# Patient Record
Sex: Female | Born: 1950 | Race: White | Hispanic: Yes | Marital: Married | Smoking: Former smoker
Health system: Southern US, Community
[De-identification: ages and names within clinical notes are randomized; demographics above are authoritative.]

## PROBLEM LIST (undated history)

## (undated) DIAGNOSIS — M8580 Other specified disorders of bone density and structure, unspecified site: Secondary | ICD-10-CM

## (undated) DIAGNOSIS — G20A1 Parkinson's disease without dyskinesia, without mention of fluctuations: Secondary | ICD-10-CM

## (undated) DIAGNOSIS — Z862 Personal history of diseases of the blood and blood-forming organs and certain disorders involving the immune mechanism: Secondary | ICD-10-CM

## (undated) HISTORY — DX: Gilbert syndrome: E80.4

## (undated) HISTORY — DX: Other specified disorders of bone density and structure, unspecified site: M85.80

## (undated) HISTORY — PX: TONSILLECTOMY: SUR1361

## (undated) HISTORY — PX: BREAST EXCISIONAL BIOPSY: SUR124

## (undated) HISTORY — DX: Parkinson's disease without dyskinesia, without mention of fluctuations: G20.A1

## (undated) HISTORY — DX: Personal history of diseases of the blood and blood-forming organs and certain disorders involving the immune mechanism: Z86.2

---

## 1991-07-10 HISTORY — PX: ABDOMINAL HYSTERECTOMY: SHX81

## 2008-08-26 LAB — HM COLONOSCOPY

## 2017-12-13 LAB — HM MAMMOGRAPHY

## 2018-02-11 LAB — COMPREHENSIVE METABOLIC PANEL WITH GFR: EGFR: 70.1

## 2019-05-29 DIAGNOSIS — R251 Tremor, unspecified: Secondary | ICD-10-CM | POA: Insufficient documentation

## 2019-05-29 DIAGNOSIS — R5382 Chronic fatigue, unspecified: Secondary | ICD-10-CM | POA: Insufficient documentation

## 2019-06-09 LAB — COMPREHENSIVE METABOLIC PANEL WITH GFR: EGFR: 80.6

## 2019-06-09 LAB — HEMOGLOBIN A1C: A1c: 5.1

## 2019-12-02 DIAGNOSIS — G20 Parkinson's disease: Secondary | ICD-10-CM | POA: Insufficient documentation

## 2019-12-15 LAB — HM COLONOSCOPY

## 2019-12-18 LAB — HM MAMMOGRAPHY

## 2020-06-22 LAB — HM DEXA SCAN

## 2020-08-12 ENCOUNTER — Ambulatory Visit: Payer: Medicare PPO | Admitting: Family

## 2020-09-26 NOTE — Progress Notes (Signed)
Subjective:    Patient ID: Dominique Griffin, female    DOB: November 01, 1950, 70 y.o.   MRN: [id number]  HPI  She is here to establish with a new pcp.  The patient is here for follow up of their chronic medical problems.    She moved here in August from [place].    She has osteopenia. She has parkinson's.     She exericses regualry - cycling, weights once a week, walks dog, boxing.    She has tailbone pain - ? Lower back arthritis.  Better w/ nsaids.  She takes naproxen.    New - started about 10 days ago.  Started with exercise class.  Tenderness improving.     Parkinson's  -- following with neuro at [facility].  Fatigue .  Had mild tremor in r arm with fatigue, has some slowness in her R arm.      Sudafed - occ sinus headache - advil sinus.    Medications and allergies reviewed with patient and updated if appropriate.  Patient Active Problem List   Diagnosis Date Noted  . Arthritis 09/27/2020  . [place] disease 09/27/2020  . Parkinson's disease (HCC) 12/02/2019  . Tremor 05/29/2019  . Chronic fatigue 05/29/2019    Current Outpatient Medications on File Prior to Visit  Medication Sig Dispense Refill  . calcium carbonate (TUMS - DOSED IN MG ELEMENTAL CALCIUM) 500 MG chewable tablet Chew 1 tablet by mouth daily.    . Cholecalciferol 50 MCG (2000 UT) TABS Take by mouth.    Marland Kitchen ibuprofen (ADVIL) 200 MG tablet Take by mouth.    . Multiple Vitamin (MULTIVITAMIN ADULT PO) Take by mouth.    . pseudoephedrine (SUDAFED 12 HOUR) 120 MG 12 hr tablet Take 120 mg by mouth 2 (two) times daily.     No current facility-administered medications on file prior to visit.    History reviewed. No pertinent past medical history.  Past Surgical History:  Procedure Laterality Date  . ABDOMINAL HYSTERECTOMY     partial - still has ovaries    Social History   Socioeconomic History  . Marital status: Married    Spouse name: Not on file  . Number of children: Not on file  . Years of education:  Not on file  . Highest education level: Not on file  Occupational History  . Not on file  Tobacco Use  . Smoking status: Not on file  . Smokeless tobacco: Not on file  Substance and Sexual Activity  . Alcohol use: Not on file  . Drug use: Not on file  . Sexual activity: Not on file  Other Topics Concern  . Not on file  Social History Narrative  . Not on file   Social Determinants of Health   Financial Resource Strain: Not on file  Food Insecurity: Not on file  Transportation Needs: Not on file  Physical Activity: Not on file  Stress: Not on file  Social Connections: Not on file    Family History  Problem Relation Age of Onset  . Breast cancer Mother   . Stroke Mother     Review of Systems  Constitutional: Negative for chills and fever.  HENT: Positive for postnasal drip.   Eyes: Negative for visual disturbance.  Respiratory: Negative for cough, shortness of breath and wheezing.   Cardiovascular: Negative for chest pain, palpitations and leg swelling.  Gastrointestinal: Negative for abdominal pain, constipation and diarrhea.       Rare gerd  Musculoskeletal: Positive for  back pain (lower back, tailbone). Negative for arthralgias (occ joint pain).  Skin: Negative for rash.  Neurological: Positive for tremors. Negative for light-headedness and headaches.       Poor balance  Psychiatric/Behavioral: Negative for dysphoric mood. The patient is not nervous/anxious.        Objective:   Vitals:   09/27/20 0957  BP: 124/70  Pulse: 99  Temp: 98.1 F (36.7 C)  SpO2: 100%   BP Readings from Last 3 Encounters:  09/27/20 124/70   Wt Readings from Last 3 Encounters:  09/27/20 210 lb (95.3 kg)   Body mass index is 33.89 kg/m.   Depression screen [facility] 2/9 09/27/2020  Decreased Interest 0  Down, Depressed, Hopeless 0  PHQ - 2 Score 0  Altered sleeping 0  Tired, decreased energy 1  Change in appetite 0  Feeling bad or failure about yourself  0  Trouble  concentrating 0  Moving slowly or fidgety/restless 0  Suicidal thoughts 0  PHQ-9 Score 1  Difficult doing work/chores Somewhat difficult    GAD 7 : Generalized Anxiety Score 09/27/2020  Nervous, Anxious, on Edge 0  Control/stop worrying 0  Worry too much - different things 0  Trouble relaxing 0  Restless 0  Easily annoyed or irritable 0  Afraid - awful might happen 0  Total GAD 7 Score 0      Physical Exam Constitutional:      General: She is not in acute distress.    Appearance: Normal appearance. She is not ill-appearing.  HENT:     Head: Normocephalic and atraumatic.  Eyes:     Conjunctiva/sclera: Conjunctivae normal.  Neck:     Vascular: No carotid bruit.  Cardiovascular:     Rate and Rhythm: Normal rate and regular rhythm.     Heart sounds: No murmur heard.   Pulmonary:     Effort: Pulmonary effort is normal. No respiratory distress.     Breath sounds: No wheezing or rales.  Abdominal:     General: There is no distension.     Palpations: Abdomen is soft.     Tenderness: There is no abdominal tenderness. There is no guarding.  Musculoskeletal:     Cervical back: Neck supple. No tenderness.     Right lower leg: No edema.     Left lower leg: No edema.  Lymphadenopathy:     Cervical: No cervical adenopathy.  Skin:    General: Skin is warm and dry.  Neurological:     Mental Status: She is alert.  Psychiatric:        Mood and Affect: Mood normal.              Assessment & Plan:    Screened for depression using the PHQ 9 scale.  No evidence of depression.    Screened for anxiety using GAD7 Scale.  No evidence of anxiety.    See Problem List for Assessment and Plan of chronic medical problems.    This visit occurred during the SARS-CoV-2 public health emergency.  Safety protocols were in place, including screening questions prior to the visit, additional usage of staff PPE, and extensive cleaning of exam room while observing appropriate contact  time as indicated for disinfecting solutions.

## 2020-09-27 ENCOUNTER — Encounter: Payer: Self-pay | Admitting: Internal Medicine

## 2020-09-27 ENCOUNTER — Ambulatory Visit (INDEPENDENT_AMBULATORY_CARE_PROVIDER_SITE_OTHER): Payer: Medicare PPO | Admitting: Internal Medicine

## 2020-09-27 ENCOUNTER — Other Ambulatory Visit: Payer: Self-pay

## 2020-09-27 DIAGNOSIS — G20 Parkinson's disease: Secondary | ICD-10-CM | POA: Diagnosis not present

## 2020-09-27 DIAGNOSIS — M1990 Unspecified osteoarthritis, unspecified site: Secondary | ICD-10-CM | POA: Insufficient documentation

## 2020-09-27 DIAGNOSIS — R5382 Chronic fatigue, unspecified: Secondary | ICD-10-CM | POA: Diagnosis not present

## 2020-09-27 DIAGNOSIS — M533 Sacrococcygeal disorders, not elsewhere classified: Secondary | ICD-10-CM

## 2020-09-27 NOTE — Patient Instructions (Signed)
It was nice to meet you.   Please let me know if you need anything    Please followup in 1 year

## 2020-09-27 NOTE — Assessment & Plan Note (Signed)
Chronic Related to Parkinson's disease She is exercising regularly

## 2020-09-27 NOTE — Assessment & Plan Note (Addendum)
Chronic Not on medication Following with Dr Nedra Hai at [facility] Not on medication - only symptom that bothers her is the fatigue and since medication will not help she does not want to be on medication Exercising regularly - walks dog, boxing, weights once a week, cycling

## 2020-09-27 NOTE — Assessment & Plan Note (Signed)
Acute Started 10 days ago after an exercise class Improving with nsaids - she will continue

## 2020-10-06 ENCOUNTER — Encounter: Payer: Self-pay | Admitting: Internal Medicine

## 2020-10-06 NOTE — Progress Notes (Signed)
Outside notes received. Information abstracted. Notes sent to scan.  

## 2020-10-17 ENCOUNTER — Ambulatory Visit: Payer: Medicare PPO | Admitting: Family

## 2020-11-13 ENCOUNTER — Encounter: Payer: Self-pay | Admitting: Internal Medicine

## 2020-11-13 DIAGNOSIS — M8580 Other specified disorders of bone density and structure, unspecified site: Secondary | ICD-10-CM | POA: Insufficient documentation

## 2020-11-13 DIAGNOSIS — Z800 Family history of malignant neoplasm of digestive organs: Secondary | ICD-10-CM | POA: Insufficient documentation

## 2020-11-13 DIAGNOSIS — M810 Age-related osteoporosis without current pathological fracture: Secondary | ICD-10-CM | POA: Insufficient documentation

## 2020-11-13 DIAGNOSIS — E559 Vitamin D deficiency, unspecified: Secondary | ICD-10-CM | POA: Insufficient documentation

## 2020-11-13 DIAGNOSIS — K5730 Diverticulosis of large intestine without perforation or abscess without bleeding: Secondary | ICD-10-CM | POA: Insufficient documentation

## 2021-01-30 ENCOUNTER — Encounter: Payer: Self-pay | Admitting: Internal Medicine

## 2021-01-30 DIAGNOSIS — Z1231 Encounter for screening mammogram for malignant neoplasm of breast: Secondary | ICD-10-CM

## 2021-02-03 DIAGNOSIS — H524 Presbyopia: Secondary | ICD-10-CM | POA: Diagnosis not present

## 2021-02-03 DIAGNOSIS — H2513 Age-related nuclear cataract, bilateral: Secondary | ICD-10-CM | POA: Diagnosis not present

## 2021-02-03 DIAGNOSIS — D3132 Benign neoplasm of left choroid: Secondary | ICD-10-CM | POA: Diagnosis not present

## 2021-03-20 DIAGNOSIS — G20 Parkinson's disease: Secondary | ICD-10-CM | POA: Diagnosis not present

## 2021-04-14 ENCOUNTER — Ambulatory Visit: Payer: Medicare PPO

## 2021-04-14 ENCOUNTER — Encounter: Payer: Self-pay | Admitting: Internal Medicine

## 2021-05-02 ENCOUNTER — Encounter: Payer: Self-pay | Admitting: Internal Medicine

## 2021-05-02 NOTE — Progress Notes (Signed)
Outside notes received. Information abstracted. Notes sent to scan.  

## 2021-05-08 ENCOUNTER — Other Ambulatory Visit: Payer: Self-pay

## 2021-05-08 ENCOUNTER — Ambulatory Visit
Admission: RE | Admit: 2021-05-08 | Discharge: 2021-05-08 | Disposition: A | Discharge status: Home / Self Care | Payer: Medicare PPO | Source: Ambulatory Visit | Attending: Internal Medicine | Admitting: Internal Medicine

## 2021-05-08 DIAGNOSIS — Z1231 Encounter for screening mammogram for malignant neoplasm of breast: Secondary | ICD-10-CM | POA: Diagnosis not present

## 2021-05-10 ENCOUNTER — Other Ambulatory Visit: Payer: Self-pay | Admitting: Internal Medicine

## 2021-05-10 DIAGNOSIS — R928 Other abnormal and inconclusive findings on diagnostic imaging of breast: Secondary | ICD-10-CM

## 2021-06-05 ENCOUNTER — Ambulatory Visit
Admission: RE | Admit: 2021-06-05 | Discharge: 2021-06-05 | Disposition: A | Discharge status: Home / Self Care | Payer: Medicare PPO | Source: Ambulatory Visit | Attending: Internal Medicine | Admitting: Internal Medicine

## 2021-06-05 ENCOUNTER — Other Ambulatory Visit: Payer: Self-pay

## 2021-06-05 ENCOUNTER — Other Ambulatory Visit: Payer: Self-pay | Admitting: Internal Medicine

## 2021-06-05 DIAGNOSIS — N6489 Other specified disorders of breast: Secondary | ICD-10-CM

## 2021-06-05 DIAGNOSIS — R928 Other abnormal and inconclusive findings on diagnostic imaging of breast: Secondary | ICD-10-CM

## 2021-06-05 DIAGNOSIS — R922 Inconclusive mammogram: Secondary | ICD-10-CM | POA: Diagnosis not present

## 2021-06-21 ENCOUNTER — Encounter: Payer: Self-pay | Admitting: Internal Medicine

## 2021-06-22 DIAGNOSIS — L821 Other seborrheic keratosis: Secondary | ICD-10-CM | POA: Diagnosis not present

## 2021-06-22 DIAGNOSIS — D225 Melanocytic nevi of trunk: Secondary | ICD-10-CM | POA: Diagnosis not present

## 2021-06-22 DIAGNOSIS — L720 Epidermal cyst: Secondary | ICD-10-CM | POA: Diagnosis not present

## 2021-06-22 DIAGNOSIS — L570 Actinic keratosis: Secondary | ICD-10-CM | POA: Diagnosis not present

## 2021-06-22 DIAGNOSIS — L820 Inflamed seborrheic keratosis: Secondary | ICD-10-CM | POA: Diagnosis not present

## 2021-06-22 DIAGNOSIS — D1801 Hemangioma of skin and subcutaneous tissue: Secondary | ICD-10-CM | POA: Diagnosis not present

## 2021-06-22 DIAGNOSIS — L603 Nail dystrophy: Secondary | ICD-10-CM | POA: Diagnosis not present

## 2021-06-22 DIAGNOSIS — D2262 Melanocytic nevi of left upper limb, including shoulder: Secondary | ICD-10-CM | POA: Diagnosis not present

## 2021-06-27 DIAGNOSIS — Z006 Encounter for examination for normal comparison and control in clinical research program: Secondary | ICD-10-CM | POA: Insufficient documentation

## 2021-10-01 ENCOUNTER — Encounter: Payer: Self-pay | Admitting: Internal Medicine

## 2021-10-01 NOTE — Patient Instructions (Addendum)
? ? ?Blood work was ordered.   ? ? ?Medications changes include :   None ? ? ? ?Return in about 1 year (around 10/03/2022) for CPE. ? ? ? ?Health Maintenance, Female ?Adopting a healthy lifestyle and getting preventive care are important in promoting health and wellness. Ask your health care provider about: ?The right schedule for you to have regular tests and exams. ?Things you can do on your own to prevent diseases and keep yourself healthy. ?What should I know about diet, weight, and exercise? ?Eat a healthy diet ? ?Eat a diet that includes plenty of vegetables, fruits, low-fat dairy products, and lean protein. ?Do not eat a lot of foods that are high in solid fats, added sugars, or sodium. ?Maintain a healthy weight ?Body mass index (BMI) is used to identify weight problems. It estimates body fat based on height and weight. Your health care provider can help determine your BMI and help you achieve or maintain a healthy weight. ?Get regular exercise ?Get regular exercise. This is one of the most important things you can do for your health. Most adults should: ?Exercise for at least 150 minutes each week. The exercise should increase your heart rate and make you sweat (moderate-intensity exercise). ?Do strengthening exercises at least twice a week. This is in addition to the moderate-intensity exercise. ?Spend less time sitting. Even light physical activity can be beneficial. ?Watch cholesterol and blood lipids ?Have your blood tested for lipids and cholesterol at 70 years of age, then have this test every 5 years. ?Have your cholesterol levels checked more often if: ?Your lipid or cholesterol levels are high. ?You are older than 70 years of age. ?You are at high risk for heart disease. ?What should I know about cancer screening? ?Depending on your health history and family history, you may need to have cancer screening at various ages. This may include screening for: ?Breast cancer. ?Cervical cancer. ?Colorectal  cancer. ?Skin cancer. ?Lung cancer. ?What should I know about heart disease, diabetes, and high blood pressure? ?Blood pressure and heart disease ?High blood pressure causes heart disease and increases the risk of stroke. This is more likely to develop in people who have high blood pressure readings or are overweight. ?Have your blood pressure checked: ?Every 3-5 years if you are 65-20 years of age. ?Every year if you are 72 years old or older. ?Diabetes ?Have regular diabetes screenings. This checks your fasting blood sugar level. Have the screening done: ?Once every three years after age 56 if you are at a normal weight and have a low risk for diabetes. ?More often and at a younger age if you are overweight or have a high risk for diabetes. ?What should I know about preventing infection? ?Hepatitis B ?If you have a higher risk for hepatitis B, you should be screened for this virus. Talk with your health care provider to find out if you are at risk for hepatitis B infection. ?Hepatitis C ?Testing is recommended for: ?Everyone born from [birth year] through 1964. ?Anyone with known risk factors for hepatitis C. ?Sexually transmitted infections (STIs) ?Get screened for STIs, including gonorrhea and chlamydia, if: ?You are sexually active and are younger than 70 years of age. ?You are older than 70 years of age and your health care provider tells you that you are at risk for this type of infection. ?Your sexual activity has changed since you were last screened, and you are at increased risk for chlamydia or gonorrhea. Ask your health care provider  if you are at risk. ?Ask your health care provider about whether you are at high risk for HIV. Your health care provider may recommend a prescription medicine to help prevent HIV infection. If you choose to take medicine to prevent HIV, you should first get tested for HIV. You should then be tested every 3 months for as long as you are taking the medicine. ?Pregnancy ?If you are  about to stop having your period (premenopausal) and you may become pregnant, seek counseling before you get pregnant. ?Take 400 to 800 micrograms (mcg) of folic acid every day if you become pregnant. ?Ask for birth control (contraception) if you want to prevent pregnancy. ?Osteoporosis and menopause ?Osteoporosis is a disease in which the bones lose minerals and strength with aging. This can result in bone fractures. If you are 70 years old or older, or if you are at risk for osteoporosis and fractures, ask your health care provider if you should: ?Be screened for bone loss. ?Take a calcium or vitamin D supplement to lower your risk of fractures. ?Be given hormone replacement therapy (HRT) to treat symptoms of menopause. ?Follow these instructions at home: ?Alcohol use ?Do not drink alcohol if: ?Your health care provider tells you not to drink. ?You are pregnant, may be pregnant, or are planning to become pregnant. ?If you drink alcohol: ?Limit how much you have to: ?0-1 drink a day. ?Know how much alcohol is in your drink. In the [place] one drink equals one 12 oz bottle of beer (355 mL), one 5 oz glass of wine (148 mL), or one 1? oz glass of hard liquor (44 mL). ?Lifestyle ?Do not use any products that contain nicotine or tobacco. These products include cigarettes, chewing tobacco, and vaping devices, such as e-cigarettes. If you need help quitting, ask your health care provider. ?Do not use street drugs. ?Do not share needles. ?Ask your health care provider for help if you need support or information about quitting drugs. ?General instructions ?Schedule regular health, dental, and eye exams. ?Stay current with your vaccines. ?Tell your health care provider if: ?You often feel depressed. ?You have ever been abused or do not feel safe at home. ?Summary ?Adopting a healthy lifestyle and getting preventive care are important in promoting health and wellness. ?Follow your health care provider's instructions about  healthy diet, exercising, and getting tested or screened for diseases. ?Follow your health care provider's instructions on monitoring your cholesterol and blood pressure. ?This information is not intended to replace advice given to you by your health care provider. Make sure you discuss any questions you have with your health care provider. ?Document Revised: 11/14/2020 Document Reviewed: 11/14/2020 ?Elsevier Patient Education ? [place]. ? ?

## 2021-10-01 NOTE — Progress Notes (Signed)
? ? ?Subjective:  ? ? Patient ID: Dominique Griffin, female    DOB: 1950-11-13, 71 y.o.   MRN: [id number] ? ? ?This visit occurred during the SARS-CoV-2 public health emergency.  Safety protocols were in place, including screening questions prior to the visit, additional usage of staff PPE, and extensive cleaning of exam room while observing appropriate contact time as indicated for disinfecting solutions. ? ? ? ?HPI ?Dominique Griffin is here for  ?Chief Complaint  ?Patient presents with  ? Annual Exam  ? ? ?Overall doing well.  She is on an experimental drug for her Parkinson's disease-she is on the study through [place].  She continues to exercise regularly. ? ?She has been experiencing some back pain.  She is thinking of doing physical therapy-she will let me know if she needs a referral.  Pain is across the lower back, but without radiation. ? ?Medications and allergies reviewed with patient and updated if appropriate. ? ? ? ?Current Outpatient Medications on File Prior to Visit  ?Medication Sig Dispense Refill  ? calcium carbonate (TUMS - DOSED IN MG ELEMENTAL CALCIUM) 500 MG chewable tablet Chew 1 tablet by mouth daily.    ? Cholecalciferol 50 MCG (2000 UT) TABS Take by mouth.    ? ibuprofen (ADVIL) 200 MG tablet Take by mouth.    ? Multiple Vitamin (MULTIVITAMIN ADULT PO) Take by mouth.    ? pseudoephedrine (SUDAFED) 120 MG 12 hr tablet Take 120 mg by mouth 2 (two) times daily.    ? ?No current facility-administered medications on file prior to visit.  ? ? ?Review of Systems  ?Constitutional:  Negative for fever.  ?Eyes:  Negative for visual disturbance.  ?Respiratory:  Negative for cough, shortness of breath and wheezing.   ?Cardiovascular:  Negative for chest pain, palpitations and leg swelling.  ?Gastrointestinal:  Negative for abdominal pain, blood in stool, constipation, diarrhea and nausea.  ?Genitourinary:  Negative for dysuria.  ?Musculoskeletal:  Positive for arthralgias (knee) and back pain.  ?Skin:  Negative for  rash.  ?Neurological:  Positive for light-headedness (from medication). Negative for headaches.  ?Psychiatric/Behavioral:  Negative for dysphoric mood. The patient is not nervous/anxious.   ? ?   ?Objective:  ? ?Vitals:  ? 10/02/21 1033  ?BP: 130/70  ?Pulse: 79  ?Temp: 98.5 ?F (36.9 ?C)  ?SpO2: 98%  ? ?Filed Weights  ? 10/02/21 1033  ?Weight: 197 lb (89.4 kg)  ? ?Body mass index is 31.8 kg/m?. ? ?BP Readings from Last 3 Encounters:  ?10/02/21 130/70  ?09/27/20 124/70  ? ? ?Wt Readings from Last 3 Encounters:  ?10/02/21 197 lb (89.4 kg)  ?09/27/20 210 lb (95.3 kg)  ? ? ? ?  10/02/2021  ? 10:45 AM 09/27/2020  ?  7:21 PM  ?Depression screen PHQ 2/9  ?Decreased Interest 0 0  ?Down, Depressed, Hopeless 0 0  ?PHQ - 2 Score 0 0  ?Altered sleeping  0  ?Tired, decreased energy  1  ?Change in appetite  0  ?Feeling bad or failure about yourself   0  ?Trouble concentrating  0  ?Moving slowly or fidgety/restless  0  ?Suicidal thoughts  0  ?PHQ-9 Score  1  ?Difficult doing work/chores  Somewhat difficult  ? ? ? ? ?  09/27/2020  ?  7:21 PM  ?GAD 7 : Generalized Anxiety Score  ?Nervous, Anxious, on Edge 0  ?Control/stop worrying 0  ?Worry too much - different things 0  ?Trouble relaxing 0  ?Restless 0  ?Easily annoyed  or irritable 0  ?Afraid - awful might happen 0  ?Total GAD 7 Score 0  ? ? ? ? ?  ?Physical Exam ?Constitutional: She appears well-developed and well-nourished. No distress.  ?HENT:  ?Head: Normocephalic and atraumatic.  ?Right Ear: External ear normal. Normal ear canal and TM ?Left Ear: External ear normal.  Normal ear canal and TM ?Mouth/Throat: Oropharynx is clear and moist.  ?Eyes: Conjunctivae and EOM are normal.  ?Neck: Neck supple. No tracheal deviation present. No thyromegaly present.  ?No carotid bruit  ?Cardiovascular: Normal rate, regular rhythm and normal heart sounds.   ?No murmur heard.  No edema. ?Pulmonary/Chest: Effort normal and breath sounds normal. No respiratory distress. She has no wheezes. She has  no rales.  ?Breast: deferred   ?Abdominal: Soft. She exhibits no distension. There is no tenderness.  ?Lymphadenopathy: She has no cervical adenopathy.  ?Skin: Skin is warm and dry. She is not diaphoretic.  ?Psychiatric: She has a normal mood and affect. Her behavior is normal.  ? ? ? ?No results found for: WBC, HGB, HCT, PLT, GLUCOSE, CHOL, TRIG, HDL, LDLDIRECT, LDLCALC, ALT, AST, NA, K, CL, CREATININE, BUN, CO2, TSH, PSA, INR, GLUF, HGBA1C, MICROALBUR ? ? ? ?   ?Assessment & Plan:  ? ?Physical exam: ?Screening blood work  ordered ?Exercise  weights 1/ week, does pilates, power moves, kick boxing, walks dog daily ?Weight  - has lost some weight  ?Substance abuse  none ? ? ?Reviewed recommended immunizations. ? ? ?Health Maintenance  ?Topic Date Due  ? Hepatitis C Screening  Never done  ? COVID-19 Vaccine (4 - Booster for Pfizer series) 10/18/2021 (Originally 06/06/2020)  ? Pneumonia Vaccine 51+ Years old (1 - PCV) 10/03/2022 (Originally 11/25/2016)  ? TETANUS/TDAP  10/03/2022 (Originally 01/23/2018)  ? DEXA SCAN  11/11/2022  ? MAMMOGRAM  05/09/2023  ? COLONOSCOPY (Pts 45-64yrs Insurance coverage will need to be confirmed)  12/14/2029  ? INFLUENZA VACCINE  Completed  ? Zoster Vaccines- Shingrix  Completed  ? HPV VACCINES  Aged Out  ?  ? ? ? ? ? ? ?See Problem List for Assessment and Plan of chronic medical problems. ? ? ? ? ?

## 2021-10-02 ENCOUNTER — Other Ambulatory Visit: Payer: Self-pay

## 2021-10-02 ENCOUNTER — Ambulatory Visit (INDEPENDENT_AMBULATORY_CARE_PROVIDER_SITE_OTHER): Payer: Medicare PPO | Admitting: Internal Medicine

## 2021-10-02 VITALS — BP 130/70 | HR 79 | Temp 98.5°F | Ht 66.0 in | Wt 197.0 lb

## 2021-10-02 DIAGNOSIS — Z0000 Encounter for general adult medical examination without abnormal findings: Secondary | ICD-10-CM

## 2021-10-02 DIAGNOSIS — M85852 Other specified disorders of bone density and structure, left thigh: Secondary | ICD-10-CM | POA: Diagnosis not present

## 2021-10-02 DIAGNOSIS — Z136 Encounter for screening for cardiovascular disorders: Secondary | ICD-10-CM | POA: Diagnosis not present

## 2021-10-02 DIAGNOSIS — G20 Parkinson's disease: Secondary | ICD-10-CM | POA: Diagnosis not present

## 2021-10-02 DIAGNOSIS — E559 Vitamin D deficiency, unspecified: Secondary | ICD-10-CM

## 2021-10-02 DIAGNOSIS — Z1159 Encounter for screening for other viral diseases: Secondary | ICD-10-CM

## 2021-10-02 LAB — COMPREHENSIVE METABOLIC PANEL
ALT: 9 U/L (ref 0–35)
AST: 12 U/L (ref 0–37)
Albumin: 4.6 g/dL (ref 3.5–5.2)
Alkaline Phosphatase: 57 U/L (ref 39–117)
BUN: 13 mg/dL (ref 6–23)
CO2: 27 mEq/L (ref 19–32)
Calcium: 9.6 mg/dL (ref 8.4–10.5)
Chloride: 104 mEq/L (ref 96–112)
Creatinine, Ser: 0.73 mg/dL (ref 0.40–1.20)
GFR: 83.66 mL/min (ref >60.00)
Glucose, Bld: 93 mg/dL (ref 70–99)
Potassium: 4.3 mEq/L (ref 3.5–5.1)
Sodium: 138 mEq/L (ref 135–145)
Total Bilirubin: 1.6 mg/dL — ABNORMAL HIGH (ref 0.2–1.2)
Total Protein: 6.7 g/dL (ref 6.0–8.3)

## 2021-10-02 LAB — CBC WITH DIFFERENTIAL/PLATELET
Basophils Absolute: 0.1 10*3/uL (ref 0.0–0.1)
Basophils Relative: 0.9 % (ref 0.0–3.0)
Eosinophils Absolute: 0.1 10*3/uL (ref 0.0–0.7)
Eosinophils Relative: 1.8 % (ref 0.0–5.0)
HCT: 41 % (ref 36.0–46.0)
Hemoglobin: 14.1 g/dL (ref 12.0–15.0)
Lymphocytes Relative: 18.2 % (ref 12.0–46.0)
Lymphs Abs: 1.3 10*3/uL (ref 0.7–4.0)
MCHC: 34.3 g/dL (ref 30.0–36.0)
MCV: 89.9 fl (ref 78.0–100.0)
Monocytes Absolute: 0.8 10*3/uL (ref 0.1–1.0)
Monocytes Relative: 10.8 % (ref 3.0–12.0)
Neutro Abs: 4.9 10*3/uL (ref 1.4–7.7)
Neutrophils Relative %: 68.3 % (ref 43.0–77.0)
Platelets: 251 10*3/uL (ref 150.0–400.0)
RBC: 4.56 Mil/uL (ref 3.87–5.11)
RDW: 12.7 % (ref 11.5–15.5)
WBC: 7.2 10*3/uL (ref 4.0–10.5)

## 2021-10-02 LAB — LIPID PANEL
Cholesterol: 180 mg/dL (ref 0–200)
HDL: 62.5 mg/dL (ref >39.00)
LDL Cholesterol: 105 mg/dL — ABNORMAL HIGH (ref 0–99)
NonHDL: 117.24
Total CHOL/HDL Ratio: 3
Triglycerides: 62 mg/dL (ref 0.0–149.0)
VLDL: 12.4 mg/dL (ref 0.0–40.0)

## 2021-10-02 NOTE — Assessment & Plan Note (Signed)
Chronic Taking vitamin D daily Check vitamin D level  

## 2021-10-02 NOTE — Assessment & Plan Note (Addendum)
Chronic ?Following with neurology at [facility]-Dr. Nedra Hai ?Exercising regularly ?On research study - new dopamine agonist through [facility] ?

## 2021-10-02 NOTE — Assessment & Plan Note (Signed)
Chronic ?Exercising regularly ?Continue calcium and vitamin d daily, MVI ?

## 2021-10-03 LAB — HEPATITIS C ANTIBODY
Hepatitis C Ab: NONREACTIVE
SIGNAL TO CUT-OFF: <0.02 (ref <1.00)

## 2021-10-03 LAB — TSH: TSH: 2.38 u[IU]/mL (ref 0.35–5.50)

## 2021-10-03 LAB — VITAMIN D 25 HYDROXY (VIT D DEFICIENCY, FRACTURES): VITD: 43.42 ng/mL (ref 30.00–100.00)

## 2021-11-15 ENCOUNTER — Encounter: Payer: Self-pay | Admitting: Podiatry

## 2021-11-15 ENCOUNTER — Ambulatory Visit: Payer: Medicare PPO | Admitting: Podiatry

## 2021-11-15 DIAGNOSIS — L600 Ingrowing nail: Secondary | ICD-10-CM

## 2021-11-15 DIAGNOSIS — B351 Tinea unguium: Secondary | ICD-10-CM

## 2021-11-15 NOTE — Progress Notes (Signed)
Subjective:  ? ?Patient ID: Dominique Griffin, female   DOB: 71 y.o.   MRN: [id number]  ? ?HPI ?Patient presents with several concerns with 1 being discoloration of nailbeds bilateral and secondarily incurvated nail bed right over left hallux medial border that is been painful and she cannot take care of.  She does have Parkinson's and it is hard for her to provide her own type of care does not smoke likes to be active ? ? ?Review of Systems  ?All other systems reviewed and are negative. ? ? ?   ?Objective:  ?Physical Exam ?Vitals and nursing note reviewed.  ?Constitutional:   ?   Appearance: She is well-developed.  ?Pulmonary:  ?   Effort: Pulmonary effort is normal.  ?Musculoskeletal:     ?   General: Normal range of motion.  ?Skin: ?   General: Skin is warm.  ?Neurological:  ?   Mental Status: She is alert.  ?  ?Neurovascular status intact muscle strength found to be adequate range of motion adequate.  Patient is found to have discoloration of the hallux nail bilateral on the distal lateral side one third of the nailbed localized has incurvated hallux nail right over left with pain in the medial border difficulty wearing shoe gear with no redness no drainage noted.  Good digital perfusion well oriented x3 ingrown ? ?   ?Assessment:  ?Toenail deformity hallux right over left with medial border involvement with moderate mycotic infection nailbeds versus trauma ? ?   ?Plan:  ?H&P all conditions reviewed and do not recommend current treatment for fungus as I think this is localized.  I do recommend treatment for the ingrown toenail and she wants to get this done and I went ahead and I explained procedure risk and were just can to do the right 1 today and I infiltrated the right toe 60 mg like Marcaine mixture sterile prep done and using sterile instrumentation I remove the medial border exposed matrix applied phenol 3 applications 30 seconds followed by alcohol lavage sterile dressing gave instructions on soaks and to  leave dressing on 24 hours but take it off earlier if throbbing were to occur and encouraged her to call questions concerns which may arise ?   ? ? ?

## 2021-11-15 NOTE — Patient Instructions (Signed)

## 2021-12-11 ENCOUNTER — Ambulatory Visit
Admission: RE | Admit: 2021-12-11 | Discharge: 2021-12-11 | Disposition: A | Discharge status: Home / Self Care | Payer: Medicare PPO | Source: Ambulatory Visit | Attending: Internal Medicine | Admitting: Internal Medicine

## 2021-12-11 DIAGNOSIS — N6489 Other specified disorders of breast: Secondary | ICD-10-CM

## 2021-12-11 DIAGNOSIS — N6011 Diffuse cystic mastopathy of right breast: Secondary | ICD-10-CM | POA: Diagnosis not present

## 2021-12-11 DIAGNOSIS — R928 Other abnormal and inconclusive findings on diagnostic imaging of breast: Secondary | ICD-10-CM | POA: Diagnosis not present

## 2021-12-12 ENCOUNTER — Other Ambulatory Visit: Payer: Self-pay | Admitting: Internal Medicine

## 2021-12-12 DIAGNOSIS — Z1231 Encounter for screening mammogram for malignant neoplasm of breast: Secondary | ICD-10-CM

## 2021-12-29 ENCOUNTER — Encounter: Payer: Self-pay | Admitting: Internal Medicine

## 2022-01-05 ENCOUNTER — Encounter: Payer: Self-pay | Admitting: Internal Medicine

## 2022-01-08 ENCOUNTER — Other Ambulatory Visit: Payer: Self-pay

## 2022-01-08 ENCOUNTER — Encounter: Payer: Self-pay | Admitting: Physical Therapy

## 2022-01-08 ENCOUNTER — Ambulatory Visit: Payer: Medicare PPO | Attending: Neurology | Admitting: Physical Therapy

## 2022-01-08 DIAGNOSIS — R42 Dizziness and giddiness: Secondary | ICD-10-CM | POA: Diagnosis not present

## 2022-01-08 DIAGNOSIS — R2681 Unsteadiness on feet: Secondary | ICD-10-CM | POA: Diagnosis not present

## 2022-01-08 NOTE — Therapy (Signed)
OUTPATIENT PHYSICAL THERAPY VESTIBULAR EVALUATION     Patient Name: Dominique Griffin MRN: [id number] DOB:[birth year], 71 y.o., female Today's Date: 01/08/2022  PCP: Pincus Sanes, MD REFERRING PROVIDER: Corie Chiquito, MD   PT End of Session - 01/08/22 [phone]     Visit Number 1    Number of Visits 13    Date for PT Re-Evaluation 02/19/22    Authorization Type [employer]    Progress Note Due on Visit 10    PT Start Time 0805    PT Stop Time 0854    PT Time Calculation (min) 49 min             History reviewed. No pertinent past medical history. 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  Has been very debilitating-didn't even go to boxing class last week. Pt accompanied by: significant other  PERTINENT HISTORY: Parkinson's disease dx 2020, hx of low back pain; arthritis, chronic fatigue, Gilbert disease, osteopenia   PAIN:  Are you having pain? No  PRECAUTIONS: None  WEIGHT BEARING RESTRICTIONS No  FALLS: Has patient fallen in last 6 months? No  LIVING ENVIRONMENT: Lives with: lives with their family and lives with their spouse  PLOF: Independent,  exercises in community Parkinson's exercise classes several times per week.  PATIENT GOALS To get rid of dizziness sensation  OBJECTIVE:   COGNITION: Overall cognitive status: Within functional limits for tasks assessed    Cervical ROM:   Active A/PROM (deg) eval  Flexion   Extension   Right lateral flexion   Left lateral flexion   Right rotation WFL  Left rotation WFL  (Blank rows = not tested)  STRENGTH: NT at eval  BED MOBILITY:  independent  TRANSFERS: Assistive device utilized: None  Sit to stand: Complete Independence Stand to sit: Complete Independence GAIT: Gait pattern:  slowed, guarded, step through pattern, decreased arm swing- Right, and decreased arm swing- Left Distance walked: 60 ft Assistive device utilized: None Level of assistance: Modified independence Comments: Guarded gait pattern  PATIENT SURVEYS:  FOTO Score of 35 at intake/eval; predicted score 51   VESTIBULAR ASSESSMENT   GENERAL OBSERVATION: Moving very slowly    SYMPTOM BEHAVIOR:   Subjective history: Has had hx of lightheadedness in the past.  In the past several weeks, any movement makes me feel lightheaded.  This feels more like when I move my head, my eyes trail, and it makes me nauseous.  Wake up in the morning and it's not bad.  It's more when I'm still.    Non-Vestibular symptoms: tinnitus and nausea/vomiting (tinnitus is synchronus with heartbeat-this is not new)   Type of dizziness: Spinning/Vertigo, Unsteady with head/body turns, Lightheadedness/Faint,  and "World moves"   Frequency: multiple episodes/ongoing through the day   Duration: cumulative throughout the day; lasts through the day   Aggravating factors: Induced by position change: supine to sit and sit to supine, Induced by motion: turning body quickly, turning head quickly, and sitting in a moving car, Moving eyes, and tends to be worse    Relieving factors: head stationary, lying supine, medication, slow movements, and  sitting still   Progression of symptoms: worse   OCULOMOTOR EXAM:  Rates symptoms initially as 1/10   Ocular Alignment: normal   Ocular ROM: No limitations;  and slight feeling of movement upon return/stop to midline.  Saccades more to R side than L.   Spontaneous Nystagmus: absent   Gaze-Induced Nystagmus: absent   Smooth Pursuits: saccades more to R side than L   Saccades: intact         VESTIBULAR - OCULAR REFLEX:    Slow VOR: Normal   VOR Cancellation: Normal   Head-Impulse Test: HIT Right: negative HIT Left: positive   Dynamic Visual Acuity: Static: Line 7 Dynamic: Line 6 with 2-3/10 c/o dizziness    POSITIONAL TESTING: Right Dix-Hallpike: upbeating, right nystagmus Left Dix-Hallpike: no nystagmus Right Roll Test: no nystagmus Left Roll Test: no nystagmus   RDH:  mild symptoms (eyelids twitch) and no c/o until return up to sit Right roll test:  negative, except brief symptoms upon return to sit.  VESTIBULAR TREATMENT:  Canalith Repositioning:   Epley Right: Number of Reps: 1   No nystagmus, no symptoms upon return to sit  PATIENT EDUCATION: Education details: PT eval results, POC, rationale for Epley maneuver Person educated: Patient and Spouse Education method: Explanation Education comprehension: verbalized understanding   GOALS: Goals reviewed with patient? Yes  SHORT TERM GOALS: Target date: 01/22/2022  Pt will report decrease in dizziness by at least 50% with functional mobility and throughout daily activities. Baseline: Goal status: INITIAL  2.  Pt will be independent with initial HEP to reduce dizziness symptoms. Baseline:  Goal status: INITIAL   LONG TERM GOALS: Target date: 02/19/2022  Pt will be independent with progression of HEP for improved dizziness symptoms.  Baseline:  Goal status: INITIAL  2.  FOTO score to improve to at least 51 for demonstration of improved functional outcome measure. Baseline: 35 at eval Goal status: INITIAL  3.   Pt will report no c/o dizziness with functional mobility, turns, gait activities throughout the day. Baseline:  Goal status: INITIAL  4.  FGA score to be assessed and goal to be written as appropriate. Baseline:  Goal status: INITIAL  5.  MCTSIB testing to be assessed and goal to be written as appropriate. Baseline:  Goal status: INITIAL   ASSESSMENT:  CLINICAL IMPRESSION: Patient is a 71 y.o. female who was seen today for physical therapy evaluation and treatment for dizziness and giddiness.   She has had history of lightheadedness for some time, but in the past 2-3 weeks, pt has noted increased dizziness, feeling of "eyes trailing" with head movement, and nausea with cumulative movement throughout the day.  This is limiting her ability to perform daily exercise tasks in the community in addition to household ADLs and tasks.  In eval today, she does demonstrate saccadic eye movement with horizontal head turns; she has one line difference with static/dynamic visual acuity testing, and positive head thrust test to L.  Pt does have mild, brief nystagmus and mild symptoms in R Dix-Hallpike testing.  Performed Epley maneuver and  no c/o symptoms upon completion.  Pt would benefit from skilled PT to lessen symptoms of dizziness and improve/return to prior level of functional mobility.   OBJECTIVE IMPAIRMENTS Abnormal gait, decreased activity tolerance, decreased balance, decreased mobility, difficulty walking, and dizziness.   ACTIVITY LIMITATIONS bending, standing, bed mobility, hygiene/grooming, and locomotion level  PARTICIPATION LIMITATIONS: driving, shopping, community activity, and community exercise  PERSONAL FACTORS 3+ comorbidities: see above in PMH  are also affecting patient's functional outcome.   REHAB POTENTIAL: Good  CLINICAL DECISION MAKING: Evolving/moderate complexity  EVALUATION COMPLEXITY: Moderate   PLAN: PT FREQUENCY: 2x/week  PT DURATION: 6 weeks, including eval  week  PLANNED INTERVENTIONS: Therapeutic exercises, Therapeutic activity, Neuromuscular re-education, Balance training, Gait training, Patient/Family education, Vestibular training, and Canalith repositioning  PLAN FOR NEXT SESSION: Reassess Dix-Hallpike and perform Epley as needed; initiate HEP for gaze stabilization, habituation exercises; test FGA and MCTSIB as able.   Gean Maidens., PT 01/08/2022, 5:20 PM   [facility] [address] [place], [address] [place], [place] [address] Phone # [phone] Fax # [phone]

## 2022-01-10 ENCOUNTER — Ambulatory Visit: Payer: Medicare PPO | Admitting: Physical Therapy

## 2022-01-10 DIAGNOSIS — R2681 Unsteadiness on feet: Secondary | ICD-10-CM

## 2022-01-10 DIAGNOSIS — R42 Dizziness and giddiness: Secondary | ICD-10-CM

## 2022-01-10 NOTE — Therapy (Addendum)
OUTPATIENT PHYSICAL THERAPY VESTIBULAR TREATMENT NOTE   Patient Name: Dominique Griffin MRN: [id number] DOB:Jan 09, 1951, 71 y.o., female Today's Date: 01/10/2022  PCP: Binnie Rail, MD REFERRING PROVIDER: Amanda Pea, MD    01/10/22 1155  PT Visits / Re-Eval  Visit Number 2  Number of Visits 13  Date for PT Re-Evaluation 02/19/22  Authorization  Authorization Type [employer]  Progress Note Due on Visit 10  PT Time Calculation  PT Start Time 1155  PT Stop Time 1245  PT Time Calculation (min) 50 min  PT - End of Session  Activity Tolerance Patient tolerated treatment well  Behavior During Therapy [facility] for tasks assessed/performed    No past medical history on file. Past Surgical History:  Procedure Laterality Date   ABDOMINAL HYSTERECTOMY     partial - still has ovaries   BREAST EXCISIONAL BIOPSY Left    skin bbiopsy   Patient Active Problem List   Diagnosis Date Noted   Clinical trial exam 06/27/2021   Osteopenia 11/13/2020   Vitamin D deficiency 11/13/2020   Diverticulosis of colon 11/13/2020   Family history of colon cancer 11/13/2020   Arthritis 09/27/2020   Rosanna Randy disease 09/27/2020   Coccyx pain 09/27/2020   Parkinson's disease ([place]) 12/02/2019   Tremor 05/29/2019   Chronic fatigue 05/29/2019    ONSET DATE: 01/04/2022 MD referral  REFERRING DIAG: Dizziness and Giddiness  THERAPY DIAG:   Dizziness and giddiness  Unsteadiness on feet Rationale for Evaluation and Treatment Rehabilitation  PERTINENT HISTORY:  Parkinson's disease dx 2020, hx of low back pain; arthritis, chronic fatigue, Gilbert disease, osteopenia  PRECAUTIONS: none  SUBJECTIVE: Pt reports slightly better with the vertigo, and not any nausea.  "Feels like my gyroscope is off, like I'm off the ground when I'm walking"  PAIN:  Are you having pain? No    OBJECTIVE:   TODAY'S TREATMENT:  01/10/2022  POSITIONAL TESTS:  Right Dix-Hallpike: upbeating, right nystagmus  Left  Dix-Hallpike: no nystagmus and c/o lightheadedness    VESTIBULAR TREATMENT:  Canalith Repositioning: Epley Right: Number of Reps: 1, Response to Treatment: symptoms improved, and Comment: 2/10 lightheadedness, wooziness    Gaze Adaptation: x1 Viewing Horizontal: Position: sitting, then standing, Time: 30 sec, and Comment: no c/o symptoms in sitting; feels more unsteady with balance in standing  and x1 Viewing Vertical:  Position: sitting, then standing, Time: 30 sec , and Comment: rates symptoms as 3-4/10 with standing  Habituation: Brandt-Daroff: number of reps: 2 To R and to L sidelying, pt has no c/o lightheadedness.  She c/o 10-12 sec lightheaded symptoms upon return to sit from R sidelying.  She has 10-15 sec of lightheaded symptoms return to sit from L sidelying. Other: Discussed rationale for Epley, habituation.  Discussed acute BPPV versus more oculomotor/vestibular-ocular reflex mechanisms as part of dizziness.  Discussed follow-up appts with ENT and with neurologist.  PATIENT EDUCATION: Education details: HEP Person educated: Patient and Spouse Education method: Explanation, Demonstration, and Handouts Education comprehension: verbalized understanding and returned demonstration Access Code: [email or web address] URL: [email or web address] Date: 01/12/2022 Prepared by: [facility]  Program Notes Do exercises without glasses on; you will stand 2-3 ft from target; with the head movements, keep the target in focus.  You will likely bring on your symptoms to a 4-5/10 level or below.  Rest between sets to bring symptoms down to 0/10.  Exercises - Standing Gaze Stabilization with Head Rotation  - 3 x daily - 7 x weekly -  1-2 sets - 30 sec hold - Standing Gaze Stabilization with Head Nod  - 3 x daily - 7 x weekly - 1-2 sets - 30 sec hold ______________________________________________ OBJECTIVE Measures from eval, 01/08/2022: COGNITION: Overall  cognitive status: Within functional limits for tasks assessed               Cervical ROM:    Active A/PROM (deg) eval  Flexion    Extension    Right lateral flexion    Left lateral flexion    Right rotation WFL  Left rotation WFL  (Blank rows = not tested)   STRENGTH: NT at eval   BED MOBILITY:  independent   TRANSFERS: Assistive device utilized: None  Sit to stand: Complete Independence Stand to sit: Complete Independence GAIT: Gait pattern:  slowed, guarded, step through pattern, decreased arm swing- Right, and decreased arm swing- Left Distance walked: 60 ft Assistive device utilized: None Level of assistance: Modified independence Comments: Guarded gait pattern   PATIENT SURVEYS:  FOTO Score of 35 at intake/eval; predicted score 51     VESTIBULAR ASSESSMENT             GENERAL OBSERVATION: Moving very slowly                        SYMPTOM BEHAVIOR:                       Subjective history: Has had hx of lightheadedness in the past.  In the past several weeks, any movement makes me feel lightheaded.  This feels more like when I move my head, my eyes trail, and it makes me nauseous.  Wake up in the morning and it's not bad.  It's more when I'm still.                        Non-Vestibular symptoms: tinnitus and nausea/vomiting (tinnitus is synchronus with heartbeat-this is not new)                       Type of dizziness: Spinning/Vertigo, Unsteady with head/body turns, Lightheadedness/Faint, and "World moves"                       Frequency: multiple episodes/ongoing through the day                       Duration: cumulative throughout the day; lasts through the day                       Aggravating factors: Induced by position change: supine to sit and sit to supine, Induced by motion: turning body quickly, turning head quickly, and sitting in a moving car, Moving eyes, and tends to be worse                        Relieving factors: head stationary, lying supine,  medication, slow movements, and sitting still                       Progression of symptoms: worse              OCULOMOTOR EXAM:  Rates symptoms initially as 1/10                       Ocular  Alignment: normal                       Ocular ROM: No limitations;  and slight feeling of movement upon return/stop to midline.  Saccades more to R side than L.                       Spontaneous Nystagmus: absent                       Gaze-Induced Nystagmus: absent                       Smooth Pursuits: saccades more to R side than L                       Saccades: intact                                                  VESTIBULAR - OCULAR REFLEX:                        Slow VOR: Normal                       VOR Cancellation: Normal                       Head-Impulse Test: HIT Right: negative HIT Left: positive                       Dynamic Visual Acuity: Static: Line 7 Dynamic: Line 6 with 2-3/10 c/o dizziness                        POSITIONAL TESTING: Right Dix-Hallpike: upbeating, right nystagmus Left Dix-Hallpike: no nystagmus Right Roll Test: no nystagmus Left Roll Test: no nystagmus             RDH:  mild symptoms (eyelids twitch) and no c/o until return up to sit Right roll test:  negative, except brief symptoms upon return to sit.   VESTIBULAR TREATMENT:   Canalith Repositioning:                       Epley Right: Number of Reps: 1                       No nystagmus, no symptoms upon return to sit   PATIENT EDUCATION: Education details: PT eval results, POC, rationale for Epley maneuver Person educated: Patient and Spouse Education method: Explanation Education comprehension: verbalized understanding    ____________________________________________________________________________ GOALS: Goals reviewed with patient? Yes   SHORT TERM GOALS: Target date: 01/22/2022   Pt will report decrease in dizziness by at least 50% with functional mobility and throughout daily  activities. Baseline: Goal status: INITIAL   2.  Pt will be independent with initial HEP to reduce dizziness symptoms. Baseline:  Goal status: INITIAL     LONG TERM GOALS: Target date: 02/19/2022   Pt will be independent with progression of HEP for improved dizziness symptoms.  Baseline:  Goal status: INITIAL   2.  FOTO score to improve to at least 51 for demonstration  of improved functional outcome measure. Baseline: 35 at eval Goal status: INITIAL   3.  Pt will report no c/o dizziness with functional mobility, turns, gait activities throughout the day. Baseline:  Goal status: INITIAL   4.  FGA score to be assessed and goal to be written as appropriate. Baseline:  Goal status: INITIAL   5.  MCTSIB testing to be assessed and goal to be written as appropriate. Baseline:  Goal status: INITIAL     ASSESSMENT:   CLINICAL IMPRESSION: Reassessed BPPV today, with pt demonstrating nystagmus with R Dix-Hallpike testing.  Treated with CRT/Epley maneuver.   Pt continues to report more that her symptoms are a lightheadness and feeling that "gyroscope" is off.  Symptoms present more with standing gaze stabilization exercises and added those for home versus Brandt-Daroff at this time.  Will continue to assess and treat positional vertigo and oculomotor aspects contributing to pt's dizziness.     OBJECTIVE IMPAIRMENTS Abnormal gait, decreased activity tolerance, decreased balance, decreased mobility, difficulty walking, and dizziness.    ACTIVITY LIMITATIONS bending, standing, bed mobility, hygiene/grooming, and locomotion level   PARTICIPATION LIMITATIONS: driving, shopping, community activity, and community exercise   PERSONAL FACTORS 3+ comorbidities: see above in PMH  are also affecting patient's functional outcome.    REHAB POTENTIAL: Good   CLINICAL DECISION MAKING: Evolving/moderate complexity   EVALUATION COMPLEXITY: Moderate     PLAN: PT FREQUENCY: 2x/week   PT  DURATION: 6 weeks, including eval week   PLANNED INTERVENTIONS: Therapeutic exercises, Therapeutic activity, Neuromuscular re-education, Balance training, Gait training, Patient/Family education, Vestibular training, and Canalith repositioning   PLAN FOR NEXT SESSION: Reassess Dix-Hallpike and perform Epley as needed; review HEP for gaze stabilization, habituation exercises; test FGA and MCTSIB as able.        Mady Haagensen, PT 01/12/22 7:29 AM Phone: [phone] Fax: [phone]  [facility] at [facility] [place], [place], [place] [address] Phone # [phone] Fax # [phone]

## 2022-01-11 ENCOUNTER — Ambulatory Visit: Payer: Medicare PPO | Admitting: Physical Therapy

## 2022-01-16 ENCOUNTER — Ambulatory Visit: Payer: Medicare PPO | Admitting: Rehabilitative and Restorative Service Providers"

## 2022-01-16 ENCOUNTER — Encounter: Payer: Self-pay | Admitting: Rehabilitative and Restorative Service Providers"

## 2022-01-16 DIAGNOSIS — R2681 Unsteadiness on feet: Secondary | ICD-10-CM | POA: Diagnosis not present

## 2022-01-16 DIAGNOSIS — R42 Dizziness and giddiness: Secondary | ICD-10-CM

## 2022-01-16 NOTE — Therapy (Addendum)
OUTPATIENT PHYSICAL THERAPY VESTIBULAR TREATMENT NOTE     Patient Name: Dominique Griffin MRN: [id number] DOB:1951-01-28, 71 y.o., female Today's Date: 01/10/2022   PCP: Pincus Sanes, MD REFERRING PROVIDER: Corie Chiquito, MD      PT End of Session - 01/16/22 1307     Visit Number 3    Number of Visits 13    Date for PT Re-Evaluation 02/19/22    Authorization Type [employer]    Progress Note Due on Visit 10    PT Start Time 1311    PT Stop Time 1408    PT Time Calculation (min) 57 min    Activity Tolerance Patient tolerated treatment well    Behavior During Therapy [facility] for tasks assessed/performed              No past medical history on file.      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   Coccyx pain 09/27/2020   Parkinson's disease (HCC) 12/02/2019   Tremor 05/29/2019   Chronic fatigue 05/29/2019      ONSET DATE: 01/04/2022 MD referral   REFERRING DIAG: Dizziness and Giddiness   THERAPY DIAG:  Dizziness and giddiness   Unsteadiness on feet   Rationale for Evaluation and Treatment Rehabilitation   PERTINENT HISTORY:  Parkinson's disease dx 2020, hx of low back pain; arthritis, chronic fatigue, Gilbert disease, osteopenia   PRECAUTIONS: none   SUBJECTIVE: The patient reports her biggest complaint is lightheadedness with rising, but she has been tested extensively for orthostatic hypotension. She took meclizine for 2 weeks (2x/day).  She has not taken meclizine today. "I've had very little true vertigo".  I instead get this "fun  house, tilted earth".  She feels like her head is elevated. She has not been unable to walk the dog or drive since 1/06. Sensations are brief    PAIN:  Are you having pain? No       OBJECTIVE:    TODAY'S TREATMENT:  01/16/2022   POSITIONAL TESTS:   Right Dix-Hallpike: no nystagmus noted and no dizziness moving into position, gets dizziness with moving head to midline to return to sitting Left Dix-Hallpike: no nystagmus and c/o 2 clicks of visual jumping   VESTIBULAR TREATMENT:                          Gaze Adaptation: Head impulse test= positive bilaterally for low amplitude corrective saccade to target VOR Cancellation= WNLs , without dizziness X 1 gaze horizontal in standing and in sitting (worked on increased speed in both positions) X 1 gaze vertical in standing and sitting (worked on increased speed in both positions)  Balance: Gets a sensation of sway with static standing. The patient's sway worsens with gaze adaptation x 1 viewing horizontal.   Habituation: X 5 reps horizontal= gets a "lag" sensation of vision/head position X 5 reps vertical=mild sensation Brandt-Daroff: number of reps: 1-- c/o  lightheadedness with return to sitting Standing quarter turns near support surface x 5 reps  Self care: Discussed walking for exercise and trying to reduce fixating on horizon and begin walking with  husband and purposely work to scan environment at slow speeds.  Discussed HEP progression and dividing into 2 groups if symptoms increase. Discussed use of meclizine with patient taking 2x/day.  Recommended she speak with MD to work to go to prn if appropriate.    PATIENT EDUCATION: Education details: HEP Person educated: Patient and Spouse Education method: Explanation, Demonstration, and Handouts Education comprehension: verbalized understanding and returned demonstration Access Code: [email or web address] URL: [email or web address] Date: 01/16/2022 Prepared by: Margretta Ditty  Program Notes Do exercises without glasses on; you will stand 2-3 ft from target; with the head movements, keep the target in focus.  You will likely bring on your symptoms to a 4-5/10 level or below.  Rest between sets to bring symptoms down to 0/10.  Exercises - Standing Gaze Stabilization with Head Rotation  - 3 x daily - 7 x weekly - 2 reps - 30 seconds hold - Standing Gaze Stabilization with Head Nod  - 3 x daily - 7 x weekly - 2 reps - 30 sec hold - Standing Quarter Turn with Counter Support  - 2 x daily - 7 x weekly - 2 reps - Feet Together Balance with Head Rotation  - 2 x daily - 7 x weekly - 1 sets - 5 reps - Romberg Stance with Head Nods  - 2 x daily - 7 x weekly - 1 sets - 5 reps ______________________________________________ OBJECTIVE Measures from eval, 01/08/2022: COGNITION: Overall cognitive status: Within functional limits for tasks assessed               Cervical ROM:    Active A/PROM (deg) eval  Flexion    Extension    Right lateral flexion    Left lateral flexion    Right rotation WFL  Left rotation WFL  (Blank rows = not tested)   STRENGTH: NT at eval   BED MOBILITY:  independent   TRANSFERS: Assistive device utilized: None  Sit to stand: Complete Independence Stand to sit: Complete Independence GAIT: Gait pattern:  slowed, guarded, step through pattern, decreased arm swing- Right, and decreased arm swing- Left Distance walked: 60 ft Assistive device utilized: None Level of assistance: Modified independence Comments: Guarded gait pattern   PATIENT SURVEYS:  FOTO Score of 35 at intake/eval; predicted score 51     VESTIBULAR ASSESSMENT              GENERAL OBSERVATION: Moving very slowly                        SYMPTOM BEHAVIOR:                       Subjective history: Has had hx of lightheadedness in the past.  In the past several weeks, any movement makes me feel lightheaded.  This feels more like when I move my head, my eyes trail, and  it makes me nauseous.  Wake up in the morning and it's not bad.  It's more when I'm still.                        Non-Vestibular symptoms: tinnitus and nausea/vomiting (tinnitus is synchronus with heartbeat-this is not new)  Type of dizziness: Spinning/Vertigo, Unsteady with head/body turns, Lightheadedness/Faint, and "World moves"                       Frequency: multiple episodes/ongoing through the day                       Duration: cumulative throughout the day; lasts through the day                       Aggravating factors: Induced by position change: supine to sit and sit to supine, Induced by motion: turning body quickly, turning head quickly, and sitting in a moving car, Moving eyes, and tends to be worse                        Relieving factors: head stationary, lying supine, medication, slow movements, and sitting still                       Progression of symptoms: worse              OCULOMOTOR EXAM:  Rates symptoms initially as 1/10                       Ocular Alignment: normal                       Ocular ROM: No limitations;  and slight feeling of movement upon return/stop to midline.  Saccades more to R side than L.                       Spontaneous Nystagmus: absent                       Gaze-Induced Nystagmus: absent                       Smooth Pursuits: saccades more to R side than L                       Saccades: intact                                               VESTIBULAR - OCULAR REFLEX:                        Slow VOR: Normal                       VOR Cancellation: Normal                       Head-Impulse Test: HIT Right: negative HIT Left: positive                       Dynamic Visual Acuity: Static: Line 7 Dynamic: Line 6 with 2-3/10 c/o dizziness                        POSITIONAL TESTING: Right Dix-Hallpike: upbeating, right nystagmus Left Dix-Hallpike: no nystagmus Right Roll Test: no nystagmus Left Roll Test: no nystagmus  RDH:  mild symptoms (eyelids twitch) and no c/o until return up to sit Right roll test:  negative, except brief symptoms upon return to sit.    ____________________________________________________________________________ GOALS: Goals reviewed with patient? Yes   SHORT TERM GOALS: Target date: 01/22/2022   Pt will report decrease in dizziness by at least 50% with functional mobility and throughout daily activities. Baseline: Goal status: INITIAL   2.  Pt will be independent with initial HEP to reduce dizziness symptoms. Baseline:  Goal status: INITIAL     LONG TERM GOALS: Target date: 02/19/2022   Pt will be independent with progression of HEP for improved dizziness symptoms.  Baseline:  Goal status: INITIAL   2.  FOTO score to improve to at least 51 for demonstration of improved functional outcome measure. Baseline: 35 at eval Goal status: INITIAL   3.  Pt will report no c/o dizziness with functional mobility, turns, gait activities throughout the day. Baseline:  Goal status: INITIAL   4.  FGA score to be assessed and goal to be written as appropriate. Baseline:  Goal status: INITIAL   5.  MCTSIB testing to be assessed and goal to be written as appropriate. Baseline:  Goal status: INITIAL     ASSESSMENT:   CLINICAL IMPRESSION: The patient did not have positional nystagmus today.  She is reporting a lightheaded, unsteady sensation when on her feet.  PT recommended a progression of Habituation, gaze adaptation to address deficits.    OBJECTIVE IMPAIRMENTS Abnormal gait, decreased activity tolerance, decreased balance, decreased mobility, difficulty walking, and dizziness.    AREHAB POTENTIAL: Good       PLAN: PT FREQUENCY: 2x/week   PT DURATION: 6 weeks, including eval week   PLANNED INTERVENTIONS: Therapeutic exercises, Therapeutic activity, Neuromuscular re-education, Balance training, Gait training, Patient/Family education, Vestibular training, and  Canalith repositioning   PLAN FOR NEXT SESSION: review HEP for gaze stabilization, habituation exercises, and progression of home walking; test FGA and MCTSIB as able.  *work on dynamic and functional gait for return to prior functional status.      Margretta Ditty, PT   [facility] at [facility] [address] [place], [address] [place], [place] [address] Phone # [phone] Fax # [phone]

## 2022-01-19 ENCOUNTER — Ambulatory Visit: Payer: Medicare PPO | Admitting: Physical Therapy

## 2022-01-19 ENCOUNTER — Encounter: Payer: Self-pay | Admitting: Physical Therapy

## 2022-01-19 DIAGNOSIS — R2681 Unsteadiness on feet: Secondary | ICD-10-CM

## 2022-01-19 DIAGNOSIS — R42 Dizziness and giddiness: Secondary | ICD-10-CM | POA: Diagnosis not present

## 2022-01-19 NOTE — Therapy (Addendum)
OUTPATIENT PHYSICAL THERAPY VESTIBULAR TREATMENT NOTE     Patient Name: Dominique Griffin MRN: [id number] DOB:14-Sep-1950, 71 y.o., female Today's Date: 01/10/2022   PCP: Pincus SanesStacy J. Burns, MD REFERRING PROVIDER: Corie Chiquitoooney, Jeffrey W, MD      PT End of Session - 01/19/22 [phone]     Visit Number 4    Number of Visits 13    Date for PT Re-Evaluation 02/19/22    Authorization Type [employer]    Progress Note Due on Visit 10    PT Start Time 0850    PT Stop Time 0932    PT Time Calculation (min) 42 min    Activity Tolerance Patient tolerated treatment well    Behavior [facility] for [facility] assessed/performed               No past medical history on file.      Past Surgical History:  Procedure Laterality Date   ABDOMINAL HYSTERECTOMY        partial - still has ovaries   BREAST EXCISIONAL BIOPSY Left      skin bbiopsy        Patient Active Problem List    Diagnosis Date Noted   Clinical trial exam 06/27/2021   Osteopenia 11/13/2020   Vitamin D deficiency 11/13/2020   Diverticulosis of colon 11/13/2020   Family history of colon cancer 11/13/2020   Arthritis 09/27/2020   Sullivan LoneGilbert disease 09/27/2020   Coccyx pain 09/27/2020   Parkinson's disease (HCC) 12/02/2019   Tremor 05/29/2019   Chronic fatigue 05/29/2019      ONSET DATE: 01/04/2022 MD referral   REFERRING DIAG: Dizziness and Giddiness   THERAPY DIAG:   Dizziness and giddiness  Unsteadiness on feet   Rationale for Evaluation and Treatment Rehabilitation   PERTINENT HISTORY:  Parkinson's disease dx 2020, hx of low back pain; arthritis, chronic fatigue, Gilbert disease, osteopenia   PRECAUTIONS: none   SUBJECTIVE: Saw the neurologist and he's okay with me taking the Meclizine more as needed.  Doing the exercises with glasses on.  Been doing some more things out and about.  Yesterday was a more difficult day, and had to take Meclizine (the only one since last PT visit.)   PAIN:  Are you  having pain? No       OBJECTIVE:    TODAY'S TREATMENT:  01/19/2022      VESTIBULAR TREATMENT:  Reviewed HEP from last visit, with pt return demo with minimal cues.                        Gaze Adaptation: X 1 gaze horizontal in standing feet apart (worked on increased speed )-reports symptoms up to 5-6/10, 2 trials X 1 gaze vertical in standing and sitting (worked on increased speed)-reports symptoms as 5/10, 2 trials  Balance: FGA score 18/30 (scores <22/30 indicate increased fall risk).  Pt does have several episodes of R foot decreased foot clearance/foot catching with gait.  [facility] 01/19/22 0001       Functional Gait  Assessment   Gait assessed  Yes    Gait Level Surface Walks 20 ft, slow speed, abnormal gait pattern, evidence for imbalance or deviates 10-15 in outside of the 12 in walkway width. Requires more than 7 sec to ambulate 20 ft.   7.34   Change in Gait Speed Able to change speed, demonstrates mild gait deviations, deviates 6-10 in outside  of the 12 in walkway width, or no gait deviations, unable to achieve a major change in velocity, or uses a change in velocity, or uses an assistive device.    Gait with Horizontal Head Turns Performs head turns smoothly with slight change in gait velocity (eg, minor disruption to smooth gait path), deviates 6-10 in outside 12 in walkway width, or uses an assistive device.    Gait with Vertical Head Turns Performs task with slight change in gait velocity (eg, minor disruption to smooth gait path), deviates 6 - 10 in outside 12 in walkway width or uses assistive device    Gait and Pivot Turn Pivot turns safely in greater than 3 sec and stops with no loss of balance, or pivot turns safely within 3 sec and stops with mild imbalance, requires small steps to catch balance.    Step Over Obstacle Is able to step over one shoe box (4.5 in total height) without changing gait speed. No evidence of imbalance.    Gait with Narrow Base of  Support Is able to ambulate for 10 steps heel to toe with no staggering.    Gait with Eyes Closed Cannot walk 20 ft without assistance, severe gait deviations or imbalance, deviates greater than 15 in outside 12 in walkway width or will not attempt task.   veers to L   Ambulating Backwards Walks 20 ft, uses assistive device, slower speed, mild gait deviations, deviates 6-10 in outside 12 in walkway width.   20.32   Steps Alternating feet, must use rail.    Total Score 18              Standing at counter on [employer]:  head turns 2 x 5, head nods 2 x 5.  Increased in "gyroscope" symptoms to 6/10), then eyes closed head steady 30 sec x 2.  Cues for scapular retraction and glut set to avoid knees bent compensation.  Standing on [employer]:  alternating forward step taps x 10 reps, alternating back step taps x 10 reps, then alternating side step taps x 10 reps  Gait with arm swing, tall posture, increased step length and heelstrike on RLE, 60 ft x 5 reps  Habituation: X 5 reps horizontal= gets the sway sensation, 5/10 symptoms X 5 reps vertical=mild sensation of sway, 5/10 symptoms Standing quarter turns near support surface x 5 reps     PATIENT EDUCATION: Education details: Continue current HEP; with walking-work on increased RLE step length, heelstrike, relaxed arm swing  Person educated: Patient  Education method: Explanation, Demonstration Education comprehension: verbalized understanding and returned demonstration  Access Code: [email or web address] URL: [email or web address] Date: 01/16/2022 (most recent update) Prepared by: Margretta Ditty  Program Notes Do exercises without glasses on; you will stand 2-3 ft from target; with the head movements, keep the target in focus.  You will likely bring on your symptoms to a 4-5/10 level or below.  Rest between sets to bring symptoms down to 0/10.  Exercises - Standing Gaze Stabilization with Head Rotation  - 3 x daily - 7 x weekly - 2  reps - 30 seconds hold - Standing Gaze Stabilization with Head Nod  - 3 x daily - 7 x weekly - 2 reps - 30 sec hold - Standing Quarter Turn with Counter Support  - 2 x daily - 7 x weekly - 2 reps - Feet Together Balance with Head Rotation  - 2 x daily - 7 x weekly - 1 sets - 5 reps - Romberg Stance with  Head Nods  - 2 x daily - 7 x weekly - 1 sets - 5 reps ______________________________________________ OBJECTIVE Measures from eval, 01/08/2022: COGNITION: Overall cognitive status: Within functional limits for tasks assessed               Cervical ROM:    Active A/PROM (deg) eval  Flexion    Extension    Right lateral flexion    Left lateral flexion    Right rotation WFL  Left rotation WFL  (Blank rows = not tested)   STRENGTH: NT at eval   BED MOBILITY:  independent   TRANSFERS: Assistive device utilized: None  Sit to stand: Complete Independence Stand to sit: Complete Independence GAIT: Gait pattern:  slowed, guarded, step through pattern, decreased arm swing- Right, and decreased arm swing- Left Distance walked: 60 ft Assistive device utilized: None Level of assistance: Modified independence Comments: Guarded gait pattern   PATIENT SURVEYS:  FOTO Score of 35 at intake/eval; predicted score 51     VESTIBULAR ASSESSMENT              GENERAL OBSERVATION: Moving very slowly                        SYMPTOM BEHAVIOR:                       Subjective history: Has had hx of lightheadedness in the past.  In the past several weeks, any movement makes me feel lightheaded.  This feels more like when I move my head, my eyes trail, and it makes me nauseous.  Wake up in the morning and it's not bad.  It's more when I'm still.                        Non-Vestibular symptoms: tinnitus and nausea/vomiting (tinnitus is synchronus with heartbeat-this is not new)                       Type of dizziness: Spinning/Vertigo, Unsteady with head/body turns, Lightheadedness/Faint, and "World  moves"                       Frequency: multiple episodes/ongoing through the day                       Duration: cumulative throughout the day; lasts through the day                       Aggravating factors: Induced by position change: supine to sit and sit to supine, Induced by motion: turning body quickly, turning head quickly, and sitting in a moving car, Moving eyes, and tends to be worse                        Relieving factors: head stationary, lying supine, medication, slow movements, and sitting still                       Progression of symptoms: worse              OCULOMOTOR EXAM:  Rates symptoms initially as 1/10                       Ocular Alignment: normal  Ocular ROM: No limitations;  and slight feeling of movement upon return/stop to midline.  Saccades more to R side than L.                       Spontaneous Nystagmus: absent                       Gaze-Induced Nystagmus: absent                       Smooth Pursuits: saccades more to R side than L                       Saccades: intact                                               VESTIBULAR - OCULAR REFLEX:                        Slow VOR: Normal                       VOR Cancellation: Normal                       Head-Impulse Test: HIT Right: negative HIT Left: positive                       Dynamic Visual Acuity: Static: Line 7 Dynamic: Line 6 with 2-3/10 c/o dizziness                        POSITIONAL TESTING: Right Dix-Hallpike: upbeating, right nystagmus Left Dix-Hallpike: no nystagmus Right Roll Test: no nystagmus Left Roll Test: no nystagmus             RDH:  mild symptoms (eyelids twitch) and no c/o until return up to sit Right roll test:  negative, except brief symptoms upon return to sit.    ____________________________________________________________________________ GOALS: Goals reviewed with patient? Yes   SHORT TERM GOALS: Target date: 01/22/2022   Pt will report decrease in  dizziness by at least 50% with functional mobility and throughout daily activities. Baseline: Goal status: INITIAL   2.  Pt will be independent with initial HEP to reduce dizziness symptoms. Baseline:  Goal status: INITIAL     LONG TERM GOALS: Target date: 02/19/2022   Pt will be independent with progression of HEP for improved dizziness symptoms.  Baseline:  Goal status: INITIAL   2.  FOTO score to improve to at least 51 for demonstration of improved functional outcome measure. Baseline: 35 at eval Goal status: INITIAL   3.  Pt will report no c/o dizziness with functional mobility, turns, gait activities throughout the day. Baseline:  Goal status: INITIAL   4.  FGA score to be assessed and goal to be written as appropriate. Baseline:  Goal status: INITIAL   5.  MCTSIB testing to be assessed and goal to be written as appropriate. Baseline:  Goal status: INITIAL     ASSESSMENT:   CLINICAL IMPRESSION: Skilled session today focused on review of pt's current HEP given last session, with exercises continueing to bring on 5-6/10 c/o dizziness/unsteadiness.  Recommended she continue current HEP.  Also focused today  on balance on compliant surfaces, and assessed FGA.  FGA score of 18/30 indicates increased fall risk.  She does well with cues on compliant surface stepping and with gait to increase R step length, heelstrike, for overall improved amplitude of functional mobility patterns.  She will continue to benefit from habituation and balance work for improved confidence with functional mobility.  OBJECTIVE IMPAIRMENTS Abnormal gait, decreased activity tolerance, decreased balance, decreased mobility, difficulty walking, and dizziness.    AREHAB POTENTIAL: Good       PLAN: PT FREQUENCY: 2x/week   PT DURATION: 6 weeks, including eval week   PLANNED INTERVENTIONS: Therapeutic exercises, Therapeutic activity, Neuromuscular re-education, Balance training, Gait training,  Patient/Family education, Vestibular training, and Canalith repositioning   PLAN FOR NEXT SESSION: Continue to review HEP and progress for gaze stabilization, habituation exercises, and progression of home walking; test MCTSIB as able.  *work on dynamic and functional gait for return to prior functional status.      Lonia Blood, PT 01/19/22 11:26 AM Phone: [phone] Fax: [phone]    [facility] at [facility] [address] [place], [address] [place], [place] [address] Phone # [phone] Fax # [phone]

## 2022-01-22 ENCOUNTER — Ambulatory Visit: Payer: Medicare PPO | Admitting: Physical Therapy

## 2022-01-22 ENCOUNTER — Encounter: Payer: Self-pay | Admitting: Physical Therapy

## 2022-01-22 DIAGNOSIS — R42 Dizziness and giddiness: Secondary | ICD-10-CM | POA: Diagnosis not present

## 2022-01-22 DIAGNOSIS — R2681 Unsteadiness on feet: Secondary | ICD-10-CM | POA: Diagnosis not present

## 2022-01-22 NOTE — Therapy (Addendum)
OUTPATIENT PHYSICAL THERAPY VESTIBULAR TREATMENT NOTE     Patient Name: Dominique Griffin MRN: [id number] DOB:12-21-1950, 71 y.o., female Today's Date: 01/10/2022   PCP: Binnie Rail, MD REFERRING PROVIDER: Amanda Pea, MD      PT End of Session - 01/22/22 1025     Visit Number 5    Number of Visits 13    Date for PT Re-Evaluation 02/19/22    Authorization Type [employer]    Progress Note Due on Visit 10    PT Start Time 1022    PT Stop Time 1103    PT Time Calculation (min) 41 min    Activity Tolerance Patient tolerated treatment well    Behavior During Therapy [facility] for tasks assessed/performed                No past medical history on file.      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   Coccyx pain 09/27/2020   Parkinson's disease ([place]) 12/02/2019   Tremor 05/29/2019   Chronic fatigue 05/29/2019      ONSET DATE: 01/04/2022 MD referral   REFERRING DIAG: Dizziness and Giddiness   THERAPY DIAG:   Dizziness and giddiness  Unsteadiness on feet   Rationale for Evaluation and Treatment Rehabilitation   PERTINENT HISTORY:  Parkinson's disease dx 2020, hx of low back pain; arthritis, chronic fatigue, Gilbert disease, osteopenia   PRECAUTIONS: none   SUBJECTIVE: Took one Meclizine since Friday; only take that when I get very nauseaous.  Did several exercises over the weekend, and then yesterday was a pretty bad day.  Overall having less dizziness.   PAIN:  Are you having pain? No       OBJECTIVE:    TODAY'S TREATMENT:   01/22/2022      VESTIBULAR TREATMENT:                         Gaze Adaptation: X 1 gaze horizontal in standing feet together (worked on increased speed and changed foot position)-reports sway symptoms up to 5-6/10, 3 trials X 1 gaze vertical in standing feet together (worked on increased speed) and progression of foot position)-reports sway symptoms as 5/10 and minimal lightheadedness, 3 trials  Habituation: (based on pt's reports and question about needing to close eyes or open eyes when coming up from sidelying in the mornings).  Advised pt to keep eyes open for focus on target. L sidelying<>sit x 5 reps, cues for keeping eyes open, very quick, 1-2 sec "slosh" lightheadness, symptoms rated as 3/10 L side sit, feet on ground, 5 reps with "minor" slosh at the end  Balance:  Standing at counter on Airex:  head turns 2 x 5, head nods 2 x 5.  No "gyroscope" symptoms today; rates instability as 56/10), then eyes closed head steady 30 sec x 2; EC with head turns/nods x 5 reps (light UE support)  Standing on Airex:  alternating forward step taps x 10 reps, alternating back step taps x 10 reps, then alternating side step taps x 10 reps  Diagonal reaches, holding ball-tapping low then reaching high with wide BOS.  Pt rates unsteadiness as 2-3/10.      PATIENT EDUCATION: Education details: Verbally added standing on cushion with head turns/nods with wide BOS (see below) Person educated: Patient  Education method: Explanation, Demonstration Education comprehension: verbalized understanding and returned demonstration  Access Code: [email or web address] URL: [email or web address] Date: 01/22/2022 (most recent update) Prepared by: Rudell Cobb  Program Notes Do exercises without glasses on; you will stand 2-3 ft from target; with the head movements, keep the target in focus.  You will likely bring on your symptoms to a 4-5/10 level or below.  Rest between sets to bring symptoms down to  0/10.  Exercises - Standing Gaze Stabilization with Head Rotation  - 3 x daily - 7 x weekly - 2 reps - 30 seconds hold - Standing Gaze Stabilization with Head Nod  - 3 x daily - 7 x weekly - 2 reps - 30 sec hold - Standing Quarter Turn with Counter Support  - 2 x daily - 7 x weekly - 2 reps - Feet Together Balance with Head Rotation  - 2 x daily - 7 x weekly - 1 sets - 5 reps Progress to stand on cushion with head rotation, 1 sets x 5 reps - Romberg Stance with Head Nods  - 2 x daily - 7 x weekly - 1 sets - 5 reps Progress to stand on cushion with head nods, 1 sets x 5 ______________________________________________ OBJECTIVE Measures from eval, 01/08/2022: COGNITION: Overall cognitive status: Within functional limits for tasks assessed               Cervical ROM:    Active A/PROM (deg) eval  Flexion    Extension    Right lateral flexion    Left lateral flexion    Right rotation WFL  Left rotation WFL  (Blank rows = not tested)   STRENGTH: NT at eval   BED MOBILITY:  independent   TRANSFERS: Assistive device utilized: None  Sit to stand: Complete Independence Stand to sit: Complete Independence GAIT: Gait pattern:  slowed, guarded, step through pattern, decreased arm swing- Right, and decreased arm swing- Left Distance walked: 60 ft Assistive device utilized: None Level of assistance: Modified independence Comments: Guarded gait pattern   PATIENT SURVEYS:  FOTO Score of 35 at intake/eval; predicted score 51     VESTIBULAR ASSESSMENT              GENERAL OBSERVATION: Moving very slowly                        SYMPTOM BEHAVIOR:                       Subjective history: Has had hx of lightheadedness in the past.  In the past several weeks, any movement makes me feel lightheaded.  This feels more like when I move my head, my eyes trail, and it makes me nauseous.  Wake up in the morning and it's not bad.  It's more when I'm still.                        Non-Vestibular  symptoms: tinnitus and nausea/vomiting (tinnitus is synchronus with heartbeat-this is not new)  Type of dizziness: Spinning/Vertigo, Unsteady with head/body turns, Lightheadedness/Faint, and "World moves"                       Frequency: multiple episodes/ongoing through the day                       Duration: cumulative throughout the day; lasts through the day                       Aggravating factors: Induced by position change: supine to sit and sit to supine, Induced by motion: turning body quickly, turning head quickly, and sitting in a moving car, Moving eyes, and tends to be worse                        Relieving factors: head stationary, lying supine, medication, slow movements, and sitting still                       Progression of symptoms: worse              OCULOMOTOR EXAM:  Rates symptoms initially as 1/10                       Ocular Alignment: normal                       Ocular ROM: No limitations;  and slight feeling of movement upon return/stop to midline.  Saccades more to R side than L.                       Spontaneous Nystagmus: absent                       Gaze-Induced Nystagmus: absent                       Smooth Pursuits: saccades more to R side than L                       Saccades: intact                                               VESTIBULAR - OCULAR REFLEX:                        Slow VOR: Normal                       VOR Cancellation: Normal                       Head-Impulse Test: HIT Right: negative HIT Left: positive                       Dynamic Visual Acuity: Static: Line 7 Dynamic: Line 6 with 2-3/10 c/o dizziness                        POSITIONAL TESTING: Right Dix-Hallpike: upbeating, right nystagmus Left Dix-Hallpike: no nystagmus Right Roll Test: no nystagmus Left Roll Test: no nystagmus  RDH:  mild symptoms (eyelids twitch) and no c/o until return up to sit Right roll test:  negative, except brief symptoms  upon return to sit.    ____________________________________________________________________________ GOALS: Goals reviewed with patient? Yes   SHORT TERM GOALS: Target date: 01/22/2022   Pt will report decrease in dizziness by at least 50% with functional mobility and throughout daily activities. Baseline:  Reports improvement by 1/3 overall Goal status: GOAL PROGRESSING   2.  Pt will be independent with initial HEP to reduce dizziness symptoms. Baseline:  Goal status: GOAL MET     LONG TERM GOALS: Target date: 02/05/2022   Pt will be independent with progression of HEP for improved dizziness symptoms.  Baseline:  Goal status: INITIAL   2.  FOTO score to improve to at least 51 for demonstration of improved functional outcome measure. Baseline: 35 at eval Goal status: INITIAL   3.  Pt will report no c/o dizziness with functional mobility, turns, gait activities throughout the day. Baseline:  Goal status: INITIAL   4.  FGA score to be assessed and goal to be written as appropriate. Baseline:  Goal status: INITIAL   5.  MCTSIB testing to be assessed and goal to be written as appropriate. Baseline:  Goal status: INITIAL     ASSESSMENT:   CLINICAL IMPRESSION: Continueing to progress with compliant surfaces and balance activities.  No c/o dizziness today, more sway and unsteadiness.  Assessed STGs.  Pt has met 1 of 2 STGs. STG 1 partially met with improvement in dizziness, just not to goal level.  STG 2 met for HEP.  She will continue to benefit from habituation and balance work for improved confidence with functional mobility.  OBJECTIVE IMPAIRMENTS Abnormal gait, decreased activity tolerance, decreased balance, decreased mobility, difficulty walking, and dizziness.    AREHAB POTENTIAL: Good       PLAN: PT FREQUENCY: 2x/week   PT DURATION: 6 weeks, including eval week   PLANNED INTERVENTIONS: Therapeutic exercises, Therapeutic activity, Neuromuscular re-education,  Balance training, Gait training, Patient/Family education, Vestibular training, and Canalith repositioning   PLAN FOR NEXT SESSION: Continue to progress for gaze stabilization, habituation exercises, and progression of home walking; test MCTSIB as able.  *work on dynamic and functional gait for return to prior functional status.  Review verbal additions to [place], PT 01/22/22 12:00 PM Phone: [phone] Fax: [phone]    [facility] at [facility] [address] [place], [place] [address] Phone # [phone] Fax # [phone]

## 2022-01-23 ENCOUNTER — Encounter: Payer: Self-pay | Admitting: Internal Medicine

## 2022-01-23 ENCOUNTER — Encounter: Payer: Medicare PPO | Admitting: Physical Therapy

## 2022-01-25 ENCOUNTER — Encounter: Payer: Medicare PPO | Admitting: Physical Therapy

## 2022-01-30 ENCOUNTER — Encounter: Payer: Self-pay | Admitting: Physical Therapy

## 2022-01-30 ENCOUNTER — Ambulatory Visit: Payer: Medicare PPO | Admitting: Physical Therapy

## 2022-01-30 DIAGNOSIS — R2681 Unsteadiness on feet: Secondary | ICD-10-CM | POA: Diagnosis not present

## 2022-01-30 DIAGNOSIS — R42 Dizziness and giddiness: Secondary | ICD-10-CM | POA: Diagnosis not present

## 2022-01-30 NOTE — Therapy (Addendum)
OUTPATIENT PHYSICAL THERAPY VESTIBULAR TREATMENT NOTE     Patient Name: Dominique Griffin MRN: [id number] DOB:04-03-1951, 71 y.o., female Today's Date: 01/10/2022   PCP: Binnie Rail, MD REFERRING PROVIDER: Amanda Pea, MD      PT End of Session - 01/30/22 1223     Visit Number 6    Number of Visits 13    Date for PT Re-Evaluation 02/19/22    Authorization Type [employer]    Progress Note Due on Visit 10    PT Start Time 1230    PT Stop Time 1314    PT Time Calculation (min) 44 min    Activity Tolerance Patient tolerated treatment well    Behavior During Therapy [facility] for tasks assessed/performed                No past medical history on file.      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   Coccyx pain 09/27/2020   Parkinson's disease ([place]) 12/02/2019   Tremor 05/29/2019   Chronic fatigue 05/29/2019      ONSET DATE: 01/04/2022 MD referral   REFERRING DIAG: Dizziness and Giddiness   THERAPY DIAG:   Dizziness and giddiness  Unsteadiness on feet   Rationale for Evaluation and Treatment Rehabilitation   PERTINENT HISTORY:  Parkinson's disease dx 2020, hx of low back pain; arthritis, chronic fatigue, Gilbert disease, osteopenia   PRECAUTIONS: none   SUBJECTIVE: Only took Meclizine once since last visit.  Not as sever nausea, and I feel I can handle it when it comes on.  Have done some walking, some online boxing and stretching.  Dizziness is there, I know it's better.  Had a couple of "not bad" days in a row.   PAIN:  Are you  having pain? No       OBJECTIVE:    TODAY'S TREATMENT:  01/30/2022      VESTIBULAR TREATMENT:                          M-CTSIB  Condition 1: Firm Surface, EO 30 Sec, Normal Sway  Condition 2: Firm Surface, EC 30 Sec, Mild Sway  Condition 3: Foam Surface, EO 30 Sec, Mild Sway  Condition 4: Foam Surface, EC 26.4 Sec, Moderate Sway    Rates symptoms as 7/10  Balance strategies: Heel/toe raises x10 Hip strategy, ant/posterior direction, x 10 reps Performed the above 2nd set x 10, standing on Airex Lateral weightshift x 10 reps, standing on Airex Step strategy-forward, side, back, standing on Airex, intermittent UE support Cues for abdominal activation, cues for glut/quad activation for best tal posture STanding on Airex: forward>back step and weightshift, x 10 reps, UE support  Rockerboard work for hip/ankle strategy work Ant/post work x 10 with ankles, x 10 with additional hip strategy Standing midline with alt UE lifts x 10 reps, then standing steady with self-correction on board, 1-2 times reaching for  UE support to bars Lateral work x 10 with ankle/hip strategy Standing midline with alt UE lifts x 10, then standing steady with self-correction on board, 1-2 times reaching for UE support to bars   Access Code: [email or web address] URL: [email or web address] Date: 01/30/2022 (most recent update) Prepared by: [facility]  Program Notes Do exercises without glasses on; you will stand 2-3 ft from target; with the head movements, keep the target in focus.  You will likely bring on your symptoms to a 4-5/10 level or below.  Rest between sets to bring symptoms down to 0/10.  Exercises - Standing Gaze Stabilization with Head Rotation  - 3 x daily - 7 x weekly - 2 reps - 30 seconds hold - Standing Gaze Stabilization with Head Nod  - 3 x daily - 7 x weekly - 2 reps - 30 sec hold - Standing Quarter Turn with Counter Support  - 2 x daily - 7 x  weekly - 2 reps - Feet Together Balance with Head Rotation  - 2 x daily - 7 x weekly - 1 sets - 5 reps - Romberg Stance with Head Nods  - 2 x daily - 7 x weekly - 1 sets - 5 reps - Heel Toe Raises with Counter Support  - 1 x daily - 7 x weekly - 2 sets - 10 reps - Standing Lumbar Spine Flexion Stretch Counter  - 1 x daily - 7 x weekly - 2 sets - 10 reps - Alternating Side Step  - 1 x daily - 7 x weekly - 2 sets - 10 reps - Alternating Step Backward with Support  - 1 x daily - 7 x weekly - 3 sets - 10 reps - Alternating Step Forward with Support  - 1 x daily - 7 x weekly - 2 sets - 10 reps  PATIENT EDUCATION: Education details: Updates to HEP, discussion on sensory/perception mismatch in Parkinson's disease Person educated: Patient Education method: Explanation, Demonstration, and Handouts Education comprehension: verbalized understanding and returned demonstration  ______________________________________________ OBJECTIVE Measures from eval, 01/08/2022: COGNITION: Overall cognitive status: Within functional limits for tasks assessed               Cervical ROM:    Active A/PROM (deg) eval  Flexion    Extension    Right lateral flexion    Left lateral flexion    Right rotation WFL  Left rotation WFL  (Blank rows = not tested)   STRENGTH: NT at eval   BED MOBILITY:  independent   TRANSFERS: Assistive device utilized: None  Sit to stand: Complete Independence Stand to sit: Complete Independence GAIT: Gait pattern:  slowed, guarded, step through pattern, decreased arm swing- Right, and decreased arm swing- Left Distance walked: 60 ft Assistive device utilized: None Level of assistance: Modified independence Comments: Guarded gait pattern   PATIENT SURVEYS:  FOTO Score of 35 at intake/eval; predicted score 51     VESTIBULAR ASSESSMENT              GENERAL OBSERVATION: Moving very slowly                        SYMPTOM BEHAVIOR:                       Subjective history:  Has had hx of lightheadedness in the past.  In the past several weeks, any movement makes me feel lightheaded.  This feels more like when I move my head, my eyes trail, and it makes me nauseous.  Wake up in the morning and it's not bad.  It's more when I'm still.                        Non-Vestibular symptoms: tinnitus and nausea/vomiting (tinnitus is synchronus with heartbeat-this is not new)                       Type of dizziness: Spinning/Vertigo, Unsteady with head/body turns, Lightheadedness/Faint, and "World moves"                       Frequency: multiple episodes/ongoing through the day                       Duration: cumulative throughout the day; lasts through the day                       Aggravating factors: Induced by position change: supine to sit and sit to supine, Induced by motion: turning body quickly, turning head quickly, and sitting in a moving car, Moving eyes, and tends to be worse                        Relieving factors: head stationary, lying supine, medication, slow movements, and sitting still                       Progression of symptoms: worse              OCULOMOTOR EXAM:  Rates symptoms initially as 1/10                       Ocular Alignment: normal                       Ocular ROM: No limitations;  and slight feeling of movement upon return/stop to midline.  Saccades more to R side than L.                       Spontaneous Nystagmus: absent                       Gaze-Induced Nystagmus: absent                       Smooth Pursuits: saccades more to R side than L                       Saccades: intact                                               VESTIBULAR - OCULAR REFLEX:                        Slow VOR: Normal                       VOR Cancellation: Normal                       Head-Impulse Test:  HIT Right: negative HIT Left: positive                       Dynamic Visual Acuity: Static: Line 7 Dynamic: Line 6 with 2-3/10 c/o dizziness                         POSITIONAL TESTING: Right Dix-Hallpike: upbeating, right nystagmus Left Dix-Hallpike: no nystagmus Right Roll Test: no nystagmus Left Roll Test: no nystagmus             RDH:  mild symptoms (eyelids twitch) and no c/o until return up to sit Right roll test:  negative, except brief symptoms upon return to sit.    ____________________________________________________________________________ GOALS: Goals reviewed with patient? Yes   SHORT TERM GOALS: Target date: 01/22/2022   Pt will report decrease in dizziness by at least 50% with functional mobility and throughout daily activities. Baseline:  Reports improvement by 1/3 overall Goal status: GOAL PROGRESSING   2.  Pt will be independent with initial HEP to reduce dizziness symptoms. Baseline:  Goal status: GOAL MET     LONG TERM GOALS: Target date: 02/05/2022   Pt will be independent with progression of HEP for improved dizziness symptoms.  Baseline:  Goal status: INITIAL   2.  FOTO score to improve to at least 51 for demonstration of improved functional outcome measure. Baseline: 35 at eval Goal status: INITIAL   3.  Pt will report no c/o dizziness with functional mobility, turns, gait activities throughout the day. Baseline:  Goal status: INITIAL   4.  FGA score to be assessed and pt to improve to >22/30. Baseline: 18/30 Goal status: INITIAL   5.  MCTSIB testing to be assessed and goal to be written as appropriate.  Perform condition 4 full 30 seconds and minimal sway. Baseline: 26.4 sec, mod sway Goal status: INITIAL     ASSESSMENT:   CLINICAL IMPRESSION: Focused mostly on balance strategy work today, on compliant surfaces.  Pt completed MCTSIB, with minimal to moderate sway noted from PT, but pt reports feeling excessive sway, up to 7/10 lightheadedness/sway on condition 4.  Worked on limits of stability with ankle, hip, step strategies on airex and on rockerboard, to help to address potential sensory perception  mismatch that sometimes occurs with Parkinson's and balance.  Pt able to perform full sets of activities today with no additional dizziness or lightheaded c/o.  Updated HEP to reflect additional balance strategy/limits of stability work.  OBJECTIVE IMPAIRMENTS Abnormal gait, decreased activity tolerance, decreased balance, decreased mobility, difficulty walking, and dizziness.    AREHAB POTENTIAL: Good       PLAN: PT FREQUENCY: 2x/week   PT DURATION: 6 weeks, including eval week   PLANNED INTERVENTIONS: Therapeutic exercises, Therapeutic activity, Neuromuscular re-education, Balance training, Gait training, Patient/Family education, Vestibular training, and Canalith repositioning   PLAN FOR NEXT SESSION: Check LTGs and likely renew next visit.  Continue to progress for gaze stabilization, habituation exercises, and progression of home walking; *work on dynamic and functional gait for return to prior functional status.  Continue balance strategies and limits of stability.      Mady Haagensen, PT 01/30/22 2:13 PM Phone: [phone] Fax: [phone]    [facility] at [facility] [address] [place], [place] [address] Phone # [phone] Fax # [phone]

## 2022-01-30 NOTE — Patient Instructions (Signed)
Access Code: [email or web address] URL: [email or web address] Date: 01/30/2022 Prepared by: [facility]  Program Notes Do exercises without glasses on; you will stand 2-3 ft from target; with the head movements, keep the target in focus.  You will likely bring on your symptoms to a 4-5/10 level or below.  Rest between sets to bring symptoms down to 0/10.  Exercises - Standing Gaze Stabilization with Head Rotation  - 3 x daily - 7 x weekly - 2 reps - 30 seconds hold - Standing Gaze Stabilization with Head Nod  - 3 x daily - 7 x weekly - 2 reps - 30 sec hold - Standing Quarter Turn with Counter Support  - 2 x daily - 7 x weekly - 2 reps - Feet Together Balance with Head Rotation  - 2 x daily - 7 x weekly - 1 sets - 5 reps - Romberg Stance with Head Nods  - 2 x daily - 7 x weekly - 1 sets - 5 reps - Heel Toe Raises with Counter Support  - 1 x daily - 7 x weekly - 2 sets - 10 reps - Standing Lumbar Spine Flexion Stretch Counter  - 1 x daily - 7 x weekly - 2 sets - 10 reps - Alternating Side Step  - 1 x daily - 7 x weekly - 2 sets - 10 reps - Alternating Step Backward with Support  - 1 x daily - 7 x weekly - 3 sets - 10 reps - Alternating Step Forward with Support  - 1 x daily - 7 x weekly - 2 sets - 10 reps

## 2022-02-01 ENCOUNTER — Ambulatory Visit: Payer: Medicare PPO | Admitting: Physical Therapy

## 2022-02-01 ENCOUNTER — Encounter: Payer: Self-pay | Admitting: Physical Therapy

## 2022-02-01 DIAGNOSIS — R42 Dizziness and giddiness: Secondary | ICD-10-CM | POA: Diagnosis not present

## 2022-02-01 DIAGNOSIS — R2681 Unsteadiness on feet: Secondary | ICD-10-CM

## 2022-02-01 NOTE — Therapy (Addendum)
OUTPATIENT PHYSICAL THERAPY VESTIBULAR TREATMENT NOTE/PROGRESS NOTE/RECERT     Patient Name: Dominique Griffin MRN: [id number] DOB:Jan 10, 1951, 71 y.o., female Today's Date: 01/10/2022   PCP: Binnie Rail, MD REFERRING PROVIDER: Amanda Pea, MD   Progress Note Reporting Period 01/08/2022 to 02/01/2022  See note below for Objective Data and Assessment of Progress/Goals.        PT End of Session - 02/01/22 1401     Visit Number 7    Number of Visits 13    Date for PT Re-Evaluation 03/16/22    Authorization Type [employer]    Progress Note Due on Visit --   PN reported at visit 7   PT Start Time 1401    PT Stop Time 1445    PT Time Calculation (min) 44 min    Activity Tolerance Patient tolerated treatment well    Behavior During Therapy [facility] for tasks assessed/performed                 No past medical history on file.      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  Haven't used Meclizine in over a week.     PAIN:  Are you having pain? No       OBJECTIVE:    TODAY'S TREATMENT:  02/01/2022     Balance strategies standing on Airex: Heel/toe raises 2  x10 Hip strategy, ant/posterior direction, 2 x 10 reps, 1 set x 10 on solid surface with added forward/back arms to assist with balance Lateral weightshift x 10 reps, standing on Airex Step strategy-forward, side, back, standing on Airex, intermittent UE support Cues for abdominal activation, cues for glut/quad activation for best tall posture  Rockerboard work for hip/ankle strategy work Ant/post work x 10 with ankles, x 10 with additional hip strategy Standing midline with alt UE lifts x 10 reps, then standing steady with self-correction on board, 1 time reaching for UE support to bars Lateral work x 10 with ankle/hip strategy Standing midline steady with self-correction on board, able to balance and self correct through hip/ankle strategy   [facility] PT Assessment - 02/01/22 0001       Functional Gait  Assessment   Gait assessed  Yes    Gait Level Surface Walks 20 ft in less than 7 sec but greater than 5.5 sec, uses assistive device, slower speed, mild gait deviations, or deviates 6-10 in outside  of the 12 in walkway width.   6.34   Change in Gait Speed Able to change speed, demonstrates mild gait deviations, deviates 6-10 in outside of the 12 in walkway width, or no gait deviations, unable to achieve a major change in velocity, or uses a change in velocity, or uses an assistive device.    Gait with Horizontal Head Turns Performs head turns smoothly with no change in gait. Deviates no more than 6 in outside 12 in walkway width    Gait with Vertical Head Turns Performs head turns with no change in gait. Deviates no more than 6 in outside 12 in walkway width.    Gait and Pivot Turn Pivot turns safely within 3 sec and stops quickly with no loss of balance.    Step Over Obstacle Is able to step over one shoe box (4.5 in total height) without  changing gait speed. No evidence of imbalance.    Gait with Narrow Base of Support Is able to ambulate for 10 steps heel to toe with no staggering.    Gait with Eyes Closed Walks 20 ft, slow speed, abnormal gait pattern, evidence for imbalance, deviates 10-15 in outside 12 in walkway width. Requires more than 9 sec to ambulate 20 ft.    Ambulating Backwards Walks 20 ft, uses assistive device, slower speed, mild gait deviations, deviates 6-10 in outside 12 in walkway width.   22.72 sec   Steps Alternating feet, no rail.    Total Score 24    FGA comment: Improved from 18/30            FOTO assessed today, score 48 (improved from 35)   Access Code: [email or web address] URL: [email or web address] Date: 01/30/2022 (most recent update) Prepared by: [facility]  Program Notes Do exercises without glasses on; you will stand 2-3 ft from target; with the head movements, keep the target in focus.  You will likely bring on your symptoms to a 4-5/10 level or below.  Rest between sets to bring symptoms down to 0/10.  Exercises - Standing Gaze Stabilization with Head Rotation  - 3 x daily - 7 x weekly - 2 reps - 30 seconds hold - Standing Gaze Stabilization with Head Nod  - 3 x daily - 7 x weekly - 2 reps - 30 sec hold - Standing Quarter Turn with Counter Support  - 2 x daily - 7 x weekly - 2 reps - Feet Together Balance with Head Rotation  - 2 x daily - 7 x weekly - 1 sets - 5 reps - Romberg Stance with Head Nods  - 2 x daily - 7 x weekly - 1 sets - 5 reps - Heel Toe Raises with Counter Support  - 1 x daily - 7 x weekly - 2 sets - 10 reps - Standing Lumbar Spine Flexion Stretch Counter  - 1 x daily - 7 x weekly - 2 sets - 10 reps - Alternating Side Step  - 1 x daily - 7 x weekly - 2 sets - 10 reps - Alternating Step Backward with Support  - 1 x daily - 7 x weekly - 3 sets - 10 reps - Alternating Step Forward with Support  - 1 x daily - 7 x weekly - 2 sets -  10 reps PATIENT EDUCATION: Education details: Progress towards goals, POC Person educated: Patient Education method: Explanation Education comprehension: verbalized understanding   ______________________________________________ OBJECTIVE Measures from eval, 01/08/2022: COGNITION: Overall cognitive  status: Within functional limits for tasks assessed               Cervical ROM:    Active A/PROM (deg) eval  Flexion    Extension    Right lateral flexion    Left lateral flexion    Right rotation WFL  Left rotation WFL  (Blank rows = not tested)   STRENGTH: NT at eval   BED MOBILITY:  independent   TRANSFERS: Assistive device utilized: None  Sit to stand: Complete Independence Stand to sit: Complete Independence GAIT: Gait pattern:  slowed, guarded, step through pattern, decreased arm swing- Right, and decreased arm swing- Left Distance walked: 60 ft Assistive device utilized: None Level of assistance: Modified independence Comments: Guarded gait pattern   PATIENT SURVEYS:  FOTO Score of 35 at intake/eval; predicted score 51     VESTIBULAR ASSESSMENT              GENERAL OBSERVATION: Moving very slowly                        SYMPTOM BEHAVIOR:                       Subjective history: Has had hx of lightheadedness in the past.  In the past several weeks, any movement makes me feel lightheaded.  This feels more like when I move my head, my eyes trail, and it makes me nauseous.  Wake up in the morning and it's not bad.  It's more when I'm still.                        Non-Vestibular symptoms: tinnitus and nausea/vomiting (tinnitus is synchronus with heartbeat-this is not new)                       Type of dizziness: Spinning/Vertigo, Unsteady with head/body turns, Lightheadedness/Faint, and "World moves"                       Frequency: multiple episodes/ongoing through the day                       Duration: cumulative throughout the day; lasts through the day                        Aggravating factors: Induced by position change: supine to sit and sit to supine, Induced by motion: turning body quickly, turning head quickly, and sitting in a moving car, Moving eyes, and tends to be worse                        Relieving factors: head stationary, lying supine, medication, slow movements, and sitting still                       Progression of symptoms: worse              OCULOMOTOR EXAM:  Rates symptoms initially as 1/10                       Ocular Alignment: normal                       Ocular ROM: No limitations;  and slight feeling of movement upon return/stop to midline.  Saccades more to R side than L.                       Spontaneous Nystagmus: absent                       Gaze-Induced Nystagmus: absent                       Smooth Pursuits: saccades more to R side than L                       Saccades: intact                                               VESTIBULAR - OCULAR REFLEX:                        Slow VOR: Normal                       VOR Cancellation: Normal                       Head-Impulse Test: HIT Right: negative HIT Left: positive                       Dynamic Visual Acuity: Static: Line 7 Dynamic: Line 6 with 2-3/10 c/o dizziness                        POSITIONAL TESTING: Right Dix-Hallpike: upbeating, right nystagmus Left Dix-Hallpike: no nystagmus Right Roll Test: no nystagmus Left Roll Test: no nystagmus             RDH:  mild symptoms (eyelids twitch) and no c/o until return up to sit Right roll test:  negative, except brief symptoms upon return to sit.    ____________________________________________________________________________ GOALS: Goals reviewed with patient? Yes   SHORT TERM GOALS: Target date: 01/22/2022   Pt will report decrease in dizziness by at least 50% with functional mobility and throughout daily activities. Baseline:  Reports improvement by 1/3 overall Goal status: GOAL PROGRESSING   2.  Pt will be  independent with initial HEP to reduce dizziness symptoms. Baseline:  Goal status: GOAL MET     LONG TERM GOALS: Target date: 02/05/2022   Pt will be independent with progression of HEP for improved dizziness symptoms.  Baseline:  Goal status: GOAL MET   2.  FOTO score to improve to at least 51 for demonstration of improved functional outcome measure. Baseline: 35 at eval>48 02/01/2022 Goal status: PARTIALLY MET   3.  Pt will report no c/o dizziness with functional mobility, turns, gait activities throughout the day. Baseline: not yet walking the dog; approx 60% movements during the day do not bring on dizziness Goal status: PARTIALLY MET, 02/01/2022   4.  FGA score to be assessed and pt to improve to >22/30. (Revised to 27/30) Baseline: 18/30> 24/30 02/01/2022 Goal status: GOAL MET, REVISED   5.  MCTSIB testing to be assessed and goal to be written as appropriate.  Perform condition 4 full 30 seconds and minimal sway. Baseline: 26.4 sec, mod sway Goal status: GOAL ONGOING     ASSESSMENT:   CLINICAL  IMPRESSION: Assessed LTGs this visit, with pt meeting 2 of 5 LTGs.  LTG 1 for HEP and LTG 4 for improved FGA score (18/30>24/30) demonstrate improved overall functional mobility.  LTG 2 partially met for improved FOTO score and LTG 3 partially met, with pt reporting approx 60% of the day she is experiencing no dizziness.  She is making good, steady progress, and she continues to demonstrate high level balance deficits.  Her reports of nauseau and lightheadedness have significantly decreased and she is tolerating increased levels of activity in therapy sessions and daily activities.  LTG 5 was just assessed last visit and pt does demonstrate decreased vestibular system use for balance.  She will continue to benefit from skilled PT towards LTGs for improved overall functional mobility and return to independent level of community activity and exercise.  OBJECTIVE IMPAIRMENTS Abnormal gait,  decreased activity tolerance, decreased balance, decreased mobility, difficulty walking, and dizziness.    AREHAB POTENTIAL: Good       PLAN: PT FREQUENCY: 1x/week   PT DURATION: 6 weeks, per recert 09/19/9700   PLANNED INTERVENTIONS: Therapeutic exercises, Therapeutic activity, Neuromuscular re-education, Balance training, Gait training, Patient/Family education, Vestibular training, and Canalith repositioning   PLAN FOR NEXT SESSION: Recert completed.  Continue to progress for gaze stabilization, habituation exercises, and progression of home walking; *work on dynamic and functional gait for return to prior functional status.  Continue balance strategies and limits of stability.      Mady Haagensen, PT 02/01/22 3:55 PM Phone: [phone] Fax: [phone]    [facility] at [facility], [place], [address] [address] Phone # [phone] Fax # [phone]

## 2022-02-05 ENCOUNTER — Ambulatory Visit: Payer: Medicare PPO | Admitting: Physical Therapy

## 2022-02-06 DIAGNOSIS — H2513 Age-related nuclear cataract, bilateral: Secondary | ICD-10-CM | POA: Diagnosis not present

## 2022-02-06 DIAGNOSIS — H5213 Myopia, bilateral: Secondary | ICD-10-CM | POA: Diagnosis not present

## 2022-02-06 DIAGNOSIS — D3132 Benign neoplasm of left choroid: Secondary | ICD-10-CM | POA: Diagnosis not present

## 2022-02-12 ENCOUNTER — Encounter: Payer: Self-pay | Admitting: Physical Therapy

## 2022-02-12 ENCOUNTER — Ambulatory Visit: Payer: Medicare PPO | Attending: Neurology | Admitting: Physical Therapy

## 2022-02-12 DIAGNOSIS — R42 Dizziness and giddiness: Secondary | ICD-10-CM | POA: Diagnosis not present

## 2022-02-12 DIAGNOSIS — R293 Abnormal posture: Secondary | ICD-10-CM | POA: Insufficient documentation

## 2022-02-12 DIAGNOSIS — R2681 Unsteadiness on feet: Secondary | ICD-10-CM | POA: Diagnosis not present

## 2022-02-12 DIAGNOSIS — M5459 Other low back pain: Secondary | ICD-10-CM | POA: Insufficient documentation

## 2022-02-12 NOTE — Therapy (Addendum)
OUTPATIENT PHYSICAL THERAPY VESTIBULAR TREATMENT NOTE    Patient Name: Dominique Griffin MRN: [id number] DOB:1950/08/07, 71 y.o., female Today's Date: 01/10/2022   PCP: Binnie Rail, MD REFERRING PROVIDER: Amanda Pea, MD        PT End of Session - 02/12/22 1231     Visit Number 8    Number of Visits 13    Date for PT Re-Evaluation 03/16/22    Authorization Type [employer]    Authorization Time Period 01/08/2022-02/19/2022    Authorization - Visit Number 8    Authorization - Number of Visits 13    Progress Note Due on Visit --   PN reported at visit 7   PT Start Time 1232    PT Stop Time 1315    PT Time Calculation (min) 43 min    Activity Tolerance Patient tolerated treatment well    Behavior During Therapy [facility] for tasks assessed/performed                  No past medical history on file.      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  Went walking several times per day.  Still a little lightheadedness, but no nausea.   Haven't had to take Meclizine in two weeks.  Still want to work on balance and walking.  PAIN:  Are you having pain? No       OBJECTIVE:    TODAY'S TREATMENT:  02/12/2022   TODAY'S TREATMENT:  Activity Comments  Sit<>stand, 10 reps from chair, then second set standing on Airex Cues for glut, quad activation, initial forward lean  Seated hamstring stretch, 2 reps x 15 sec; then standing hamstring stretch x 2 reps   Standing gastroc stretch: Runner's stretch, 2 x 30 sec Single Foot propped on 4" step, 2 x 30 sec Bilateral feet on step, heels down 2 x 30 sec  Cues for technique for optimal stretch  Standing on balance beam:  Heel/toe raises 10 reps Alt forward step taps x 10 reps Alt back step taps x 10 reps Forward>back step and weightshift x 10 reps, 2nd set with increased speed Tandem gait, then tandem gait with side step taps, BUE support, 3 reps Sidestepping along beam, 3 reps (cues for increased step length and foot clearance) Light UE support throughout  Resisted backwards walking in parallel bars, for glut activation, 4 reps   Resisted forward gait with  blue theraband, 85 ft x 4 reps, then band removed, with cues for upright posture through hips  Quick starts/stops, no LOB  Gait with bilateral walking poles, 85 ft x 3 reps Good form, good sequence   Access Code: [email or web address] URL: [email or web address] Date: 02/12/2022-most recent update Prepared by: [facility]  Program Notes Do exercises without glasses on; you will stand 2-3 ft from target; with the head movements, keep the target in focus.  You will likely bring on your symptoms to a 4-5/10 level or below.  Rest between sets to bring symptoms down to 0/10.  Exercises - Standing Gaze Stabilization with Head Rotation  - 3 x daily - 7 x weekly - 2 reps - 30 seconds hold - Standing Gaze Stabilization with Head Nod  - 3 x daily - 7 x weekly - 2 reps - 30 sec hold - Standing  Quarter Turn with Counter Support  - 2 x daily - 7 x weekly - 2 reps - Feet Together Balance with Head Rotation  - 2 x daily - 7 x weekly - 1 sets - 5 reps - Romberg Stance with Head Nods  - 2 x daily - 7 x weekly - 1 sets - 5 reps - Heel Toe Raises with Counter Support  - 1 x daily - 7 x weekly - 2 sets - 10 reps - Standing Lumbar Spine Flexion Stretch Counter  - 1 x daily - 7 x weekly - 2 sets - 10 reps - Alternating Side Step  - 1 x daily - 7 x weekly - 2 sets - 10 reps - Alternating Step Backward with Support  - 1 x daily - 7 x weekly - 3 sets - 10 reps - Alternating Step Forward with Support  - 1 x daily - 7 x weekly - 2 sets - 10 reps - Standing Gastroc Stretch at Counter  - 2-3 x daily - 7 x weekly - 1 sets - 3 reps - 30 sec hold - Standing Bilateral Gastroc Stretch with Step  - 2-3 x daily - 7 x weekly - 1 sets - 3 reps - 15-30 sec hold    PATIENT EDUCATION: Education details: Additions of stretching to program, cues for full quad/glut activation  Person educated: Patient Education method: Explanation, Demonstration, and Handouts Education comprehension: verbalized understanding and returned demonstration    ______________________________________________ OBJECTIVE Measures from eval, 01/08/2022: COGNITION: Overall cognitive status: Within functional limits for tasks assessed               Cervical ROM:    Active A/PROM (deg) eval  Flexion    Extension    Right lateral flexion    Left lateral flexion    Right rotation WFL  Left rotation WFL  (Blank rows = not tested)   STRENGTH: NT at eval   BED MOBILITY:  independent   TRANSFERS: Assistive device utilized: None  Sit to stand: Complete Independence Stand to sit: Complete Independence GAIT: Gait pattern:  slowed, guarded, step through pattern, decreased arm swing- Right, and decreased arm swing- Left Distance walked: 60 ft Assistive device utilized: None Level of assistance: Modified independence Comments:  Guarded gait pattern   PATIENT SURVEYS:  FOTO Score of 35 at intake/eval; predicted score 51     VESTIBULAR ASSESSMENT              GENERAL OBSERVATION: Moving very slowly  SYMPTOM BEHAVIOR:                       Subjective history: Has had hx of lightheadedness in the past.  In the past several weeks, any movement makes me feel lightheaded.  This feels more like when I move my head, my eyes trail, and it makes me nauseous.  Wake up in the morning and it's not bad.  It's more when I'm still.                        Non-Vestibular symptoms: tinnitus and nausea/vomiting (tinnitus is synchronus with heartbeat-this is not new)                       Type of dizziness: Spinning/Vertigo, Unsteady with head/body turns, Lightheadedness/Faint, and "World moves"                       Frequency: multiple episodes/ongoing through the day                       Duration: cumulative throughout the day; lasts through the day                       Aggravating factors: Induced by position change: supine to sit and sit to supine, Induced by motion: turning body quickly, turning head quickly, and sitting in a moving car, Moving eyes, and tends to be worse                        Relieving factors: head stationary, lying supine, medication, slow movements, and sitting still                       Progression of symptoms: worse              OCULOMOTOR EXAM:  Rates symptoms initially as 1/10                       Ocular Alignment: normal                       Ocular ROM: No limitations;  and slight feeling of movement upon return/stop to midline.  Saccades more to R side than L.                       Spontaneous Nystagmus: absent                       Gaze-Induced Nystagmus: absent                       Smooth Pursuits: saccades more to R side than L                       Saccades: intact                                               VESTIBULAR - OCULAR REFLEX:                        Slow  VOR: Normal  VOR Cancellation: Normal                       Head-Impulse Test: HIT Right: negative HIT Left: positive                       Dynamic Visual Acuity: Static: Line 7 Dynamic: Line 6 with 2-3/10 c/o dizziness                        POSITIONAL TESTING: Right Dix-Hallpike: upbeating, right nystagmus Left Dix-Hallpike: no nystagmus Right Roll Test: no nystagmus Left Roll Test: no nystagmus             RDH:  mild symptoms (eyelids twitch) and no c/o until return up to sit Right roll test:  negative, except brief symptoms upon return to sit.    ____________________________________________________________________________ GOALS: Goals reviewed with patient? Yes   SHORT TERM GOALS: Target date: 01/22/2022   Pt will report decrease in dizziness by at least 50% with functional mobility and throughout daily activities. Baseline:  Reports improvement by 1/3 overall Goal status: GOAL PROGRESSING   2.  Pt will be independent with initial HEP to reduce dizziness symptoms. Baseline:  Goal status: GOAL MET     LONG TERM GOALS: Target date: 02/05/2022   Pt will be independent with progression of HEP for improved dizziness symptoms.  Baseline:  Goal status: GOAL MET   2.  FOTO score to improve to at least 51 for demonstration of improved functional outcome measure. Baseline: 35 at eval>48 02/01/2022 Goal status: PARTIALLY MET   3.  Pt will report no c/o dizziness with functional mobility, turns, gait activities throughout the day. Baseline: not yet walking the dog; approx 60% movements during the day do not bring on dizziness Goal status: PARTIALLY MET, 02/01/2022   4.  FGA score to be assessed and pt to improve to >22/30. (Revised to 27/30) Baseline: 18/30> 24/30 02/01/2022 Goal status: GOAL MET, REVISED   5.  MCTSIB testing to be assessed and goal to be written as appropriate.  Perform condition 4 full 30 seconds and minimal sway. Baseline: 26.4 sec, mod  sway Goal status: GOAL ONGOING     ASSESSMENT:   CLINICAL IMPRESSION: Skilled PT session today focused on functional lower extremity strengthening and stretching for optimal power and muscle activation, as pt tends to stand and ambulate with bilateral knees flexed and with decreased heelstrike.  She is able to achieve these motions with attention to increased amplitude of movement patterns.  She responds well to activation through hip extensor with resisted gait activities.  She reports improved functional mobility and activities day to day, but doesn't feel she is back to baseline yet. She will continue to benefit from skilled PT towards LTGs for improved overall functional mobility and return to independent level of community activity and exercise.  OBJECTIVE IMPAIRMENTS Abnormal gait, decreased activity tolerance, decreased balance, decreased mobility, difficulty walking, and dizziness.    AREHAB POTENTIAL: Good       PLAN: PT FREQUENCY: 1x/week   PT DURATION: 6 weeks, per recert 2/54/2706   PLANNED INTERVENTIONS: Therapeutic exercises, Therapeutic activity, Neuromuscular re-education, Balance training, Gait training, Patient/Family education, Vestibular training, and Canalith repositioning   PLAN FOR NEXT SESSION: Continue balance work, gait with resistance at hips, multi-directional stepping and work on dynamic and functional gait for return to prior functional status.  Continue balance strategies and limits of stability.  Pt to  bring her walking poles next visit to trial walking poles outdoors.      Mady Haagensen, PT 02/12/22 5:12 PM Phone: [phone] Fax: [phone]    [facility] at [facility] [place], [place], [place] [address] Phone # [phone] Fax # [phone]

## 2022-02-20 ENCOUNTER — Encounter: Payer: Self-pay | Admitting: Physical Therapy

## 2022-02-20 ENCOUNTER — Ambulatory Visit: Payer: Medicare PPO | Admitting: Physical Therapy

## 2022-02-20 DIAGNOSIS — R293 Abnormal posture: Secondary | ICD-10-CM | POA: Diagnosis not present

## 2022-02-20 DIAGNOSIS — R2681 Unsteadiness on feet: Secondary | ICD-10-CM | POA: Diagnosis not present

## 2022-02-20 DIAGNOSIS — M5459 Other low back pain: Secondary | ICD-10-CM | POA: Diagnosis not present

## 2022-02-20 DIAGNOSIS — R42 Dizziness and giddiness: Secondary | ICD-10-CM

## 2022-02-20 NOTE — Therapy (Addendum)
OUTPATIENT PHYSICAL THERAPY VESTIBULAR TREATMENT NOTE    Patient Name: Dominique Griffin MRN: [id number] DOB:1951-02-24, 71 y.o., female Today's Date: 01/10/2022   PCP: Binnie Rail, MD REFERRING PROVIDER: Amanda Pea, MD        PT End of Session - 02/20/22 0932     Visit Number 9    Number of Visits 13    Date for PT Re-Evaluation 03/16/22    Authorization Type [employer]-resubmitted to [facility] at 02/20/2022 visit    Authorization Time Period 01/08/2022-02/19/2022    Authorization - Visit Number 9    Authorization - Number of Visits 13    Progress Note Due on Visit --   PN reported at visit 7   PT Start Time 0932    PT Stop Time 1015    PT Time Calculation (min) 43 min    Activity Tolerance Patient tolerated treatment well    Behavior During Therapy [facility] for tasks assessed/performed                   No past medical history on file.      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  Did boxing twice.  Did stretches and did the exercise Have  been energetic, went to boxing.  I have bad days, medium days, good days.  Not a full good day.  Dizziness (unsteadiness and lightheadedness) still persists at times, but is better.  Brought in walking poles today.  PAIN:  Are you having pain? Yes: NPRS scale: 4/10 Pain location: L low back Pain description: soreness, knot of pain Aggravating factors: bending at low back Relieving factors: extending low back        OBJECTIVE:      TODAY'S TREATMENT: 02/20/2022 Activity Comments  Reviewed x 1 gaze stabilization,  initially with feet together-horizontal and vertical   Progressed to partial tandem stance with horizontal and vertical head motions   Reports 3-4/10 wooziness that subsides in <10 sec   5-6/10 unsteadiness/dizziness with partial tandem foot position, resolves in 15 sec or less  Sit<>stand from mat surface, 10 reps, then additional 5 reps standing on [employer] Cues for full upright posture-glut and quad activation in standing  Reviewed gastroc stretches given last visit, with pt return demo understanding Pt needs to prop foot at cabinet shelf versus using step for stretch at home; she demo good understanding of this  Palpation of lumbar spine in sitting  and standing Tenderness to palpation lateral to left SI joint  Postural re-education:  seated and standing lumbar extension (back symptoms improve) and lumbar flexion (brings on increased symptoms); practiced finding lumbar spine neutral with cues for abdominal activaiton Discussed that follow up would be needed from MD to formally assess low back pain and address-would need to add to POC (and likely re-eval)  Gait 30 ft x 6 reps no device, then progressed to use of bilateral walking poles, 85 ft x 4 reps Lowered walking poles slightly; pt has good sequence.  Cues for increased step length and sequence for walking poles to push behind (to facilitate improved posture, deliberate arm motion and step length).  Improved R heelstrike and  foot clearance noted       PATIENT EDUCATION: Education details: Pt asks about options for PT addressing back pain:  discussed PT f/u with Dr. Anna Genre or Dr. Quay Burow to add to [place] for fully assessing back pain.  Briefly addressed back pain today and extension positions seem to improve; educated on standing lumbar extension as well as lumbar spine neutral with abdominal activation; walking pole technique Person educated: Patient Education method: Explanation and Demonstration Education comprehension: verbalized understanding    Access Code: [email or web address] URL: [email or web address] Date: 08/015/2023-most recent (verbal) update Prepared by: [facility]  Program Notes Do exercises without glasses on; you will stand 2-3 ft from target; with the head movements, keep the target in focus.  You will likely bring on your symptoms to a 4-5/10 level or below.  Rest between sets to bring symptoms down to 0/10.  Exercises - Standing Gaze Stabilization with Head Rotation  - 3 x daily - 7 x weekly - 2 reps - 30 seconds hold (*progress to partial tandem stance) - Standing Gaze Stabilization with Head Nod  - 3 x daily - 7 x weekly - 2 reps - 30 sec hold (*progress to partial tandem stance) - Standing Quarter Turn with Counter Support  - 2 x daily - 7 x weekly - 2 reps - Feet Together Balance with Head Rotation  - 2 x daily - 7 x weekly - 1 sets - 5 reps - Romberg Stance with Head Nods  - 2 x daily - 7 x weekly - 1 sets - 5 reps - Heel Toe Raises with Counter Support  - 1 x daily - 7 x weekly - 2 sets - 10 reps - Standing Lumbar Spine Flexion Stretch Counter  - 1 x daily - 7 x weekly - 2 sets - 10 reps - Alternating Side Step  - 1 x daily - 7 x weekly - 2 sets - 10 reps - Alternating Step Backward with Support  - 1 x daily - 7 x weekly - 3 sets - 10 reps - Alternating Step Forward with Support  - 1 x daily - 7 x weekly - 2 sets - 10 reps - Standing Gastroc  Stretch at Counter  - 2-3 x daily - 7 x weekly - 1 sets - 3 reps - 30 sec hold - Standing Bilateral Gastroc Stretch with Step  - 2-3 x daily - 7 x weekly - 1 sets - 3 reps - 15-30 sec hold    ______________________________________________ OBJECTIVE Measures from eval, 01/08/2022: COGNITION: Overall cognitive status: Within functional limits for tasks assessed               Cervical ROM:    Active A/PROM (deg) eval  Flexion  Extension    Right lateral flexion    Left lateral flexion    Right rotation WFL  Left rotation WFL  (Blank rows = not tested)   STRENGTH: NT at eval   BED MOBILITY:  independent   TRANSFERS: Assistive device utilized: None  Sit to stand: Complete Independence Stand to sit: Complete Independence GAIT: Gait pattern:  slowed, guarded, step through pattern, decreased arm swing- Right, and decreased arm swing- Left Distance walked: 60 ft Assistive device utilized: None Level of assistance: Modified independence Comments: Guarded gait pattern   PATIENT SURVEYS:  FOTO Score of 35 at intake/eval; predicted score 51     VESTIBULAR ASSESSMENT              GENERAL OBSERVATION: Moving very slowly                        SYMPTOM BEHAVIOR:                       Subjective history: Has had hx of lightheadedness in the past.  In the past several weeks, any movement makes me feel lightheaded.  This feels more like when I move my head, my eyes trail, and it makes me nauseous.  Wake up in the morning and it's not bad.  It's more when I'm still.                        Non-Vestibular symptoms: tinnitus and nausea/vomiting (tinnitus is synchronus with heartbeat-this is not new)                       Type of dizziness: Spinning/Vertigo, Unsteady with head/body turns, Lightheadedness/Faint, and "World moves"                       Frequency: multiple episodes/ongoing through the day                       Duration: cumulative throughout the day; lasts through the day                        Aggravating factors: Induced by position change: supine to sit and sit to supine, Induced by motion: turning body quickly, turning head quickly, and sitting in a moving car, Moving eyes, and tends to be worse                        Relieving factors: head stationary, lying supine, medication, slow movements, and sitting still                       Progression of symptoms: worse              OCULOMOTOR EXAM:  Rates symptoms initially as 1/10                       Ocular Alignment: normal                       Ocular ROM: No limitations;  and slight feeling of movement upon return/stop to midline.  Saccades more to R side than L.                       Spontaneous Nystagmus: absent  Gaze-Induced Nystagmus: absent                       Smooth Pursuits: saccades more to R side than L                       Saccades: intact                                               VESTIBULAR - OCULAR REFLEX:                        Slow VOR: Normal                       VOR Cancellation: Normal                       Head-Impulse Test: HIT Right: negative HIT Left: positive                       Dynamic Visual Acuity: Static: Line 7 Dynamic: Line 6 with 2-3/10 c/o dizziness                        POSITIONAL TESTING: Right Dix-Hallpike: upbeating, right nystagmus Left Dix-Hallpike: no nystagmus Right Roll Test: no nystagmus Left Roll Test: no nystagmus             RDH:  mild symptoms (eyelids twitch) and no c/o until return up to sit Right roll test:  negative, except brief symptoms upon return to sit.    ____________________________________________________________________________ GOALS: Goals reviewed with patient? Yes   SHORT TERM GOALS: Target date: 01/22/2022   Pt will report decrease in dizziness by at least 50% with functional mobility and throughout daily activities. Baseline:  Reports improvement by 1/3 overall Goal status: GOAL PROGRESSING   2.  Pt will  be independent with initial HEP to reduce dizziness symptoms. Baseline:  Goal status: GOAL MET     LONG TERM GOALS: Target date: 02/05/2022>03/16/2022   Pt will be independent with progression of HEP for improved dizziness symptoms.  Baseline:  Goal status: GOAL MET   2.  FOTO score to improve to at least 51 for demonstration of improved functional outcome measure. Baseline: 35 at eval>48 02/01/2022 Goal status: PARTIALLY MET   3.  Pt will report no c/o dizziness with functional mobility, turns, gait activities throughout the day. Baseline: not yet walking the dog; approx 60% movements during the day do not bring on dizziness Goal status: PARTIALLY MET, 02/01/2022   4.  FGA score to be assessed and pt to improve to >22/30. (Revised to 27/30) Baseline: 18/30> 24/30 02/01/2022 Goal status: GOAL MET, REVISED   5.  MCTSIB testing to be assessed and goal to be written as appropriate.  Perform condition 4 full 30 seconds and minimal sway. Baseline: 26.4 sec, mod sway Goal status: GOAL ONGOING     ASSESSMENT:   CLINICAL IMPRESSION: Skilled PT session today focused on review of gastroc stretch and education in correct/optimal use of bilateral walking poles today, to help achieve optimal step length and heelstrike with gait.  Pt does report increased back pain today (has been underlying and pt asks about full back assessment with PT).  Briefly address  with postural education, with pt feeling that back pain decreases with more lumbar extension positioning.  *Note that we discussed going through more formal full assessment (or PT re-eval) for her low back pain, and would need to include in POC and resend to MD.    Also addressed stationary balance/VOR in standing, progressing difficulty to partial tandem stance, with this foot position bringing on increased symptoms.  Symptoms of dizziness/wooziness subside within 15 seconds, but did upgrade her exercises to include this position for habituation.   She will continue to benefit from skilled PT towards LTGs for improved overall functional mobility and return to independent level of community activity and exercise.  OBJECTIVE IMPAIRMENTS Abnormal gait, decreased activity tolerance, decreased balance, decreased mobility, difficulty walking, and dizziness.    AREHAB POTENTIAL: Good       PLAN: PT FREQUENCY: 1x/week   PT DURATION: 6 weeks, per recert 3/83/8184   PLANNED INTERVENTIONS: Therapeutic exercises, Therapeutic activity, Neuromuscular re-education, Balance training, Gait training, Patient/Family education, Vestibular training, and Canalith repositioning   PLAN FOR NEXT SESSION: Discuss full PT re-eval (and based on pt's comfort level-include in POC to Dr. Anna Genre or could ask Dr. Quay Burow for order for PT for low back for separate POC).  Continue balance work, gait with resistance at hips, multi-directional stepping and work on dynamic and functional gait for return to prior functional status.  Continue balance strategies and limits of stability.       Mady Haagensen, PT 02/20/22 11:03 AM Phone: [phone] Fax: [phone]    [facility] at [facility] [place], [address], [place] [address] Phone # [phone] Fax # [phone]

## 2022-02-22 ENCOUNTER — Ambulatory Visit (HOSPITAL_BASED_OUTPATIENT_CLINIC_OR_DEPARTMENT_OTHER): Payer: Medicare PPO | Admitting: Obstetrics & Gynecology

## 2022-02-22 ENCOUNTER — Encounter (HOSPITAL_BASED_OUTPATIENT_CLINIC_OR_DEPARTMENT_OTHER): Payer: Self-pay

## 2022-02-27 ENCOUNTER — Ambulatory Visit: Payer: Medicare PPO | Admitting: Physical Therapy

## 2022-02-27 ENCOUNTER — Encounter: Payer: Self-pay | Admitting: Physical Therapy

## 2022-02-27 DIAGNOSIS — M5459 Other low back pain: Secondary | ICD-10-CM | POA: Diagnosis not present

## 2022-02-27 DIAGNOSIS — R293 Abnormal posture: Secondary | ICD-10-CM | POA: Diagnosis not present

## 2022-02-27 DIAGNOSIS — R42 Dizziness and giddiness: Secondary | ICD-10-CM

## 2022-02-27 DIAGNOSIS — R2681 Unsteadiness on feet: Secondary | ICD-10-CM

## 2022-02-27 NOTE — Therapy (Addendum)
OUTPATIENT PHYSICAL THERAPY VESTIBULAR TREATMENT NOTE    Patient Name: Dominique Griffin MRN: [id number] DOB:[birth year], 71 y.o., female Today's Date: 01/10/2022   PCP: Binnie Rail, MD REFERRING PROVIDER: Amanda Pea, MD        PT End of Session - 02/27/22 [phone]     Visit Number 10    Number of Visits 13    Date for PT Re-Evaluation 03/16/22    Authorization Type [employer]-    Authorization Time Period 02/20/2022-03/16/2022    Authorization - Visit Number 2    Authorization - Number of Visits 4    Progress Note Due on Visit --   PN reported at visit 7   PT Start Time 0928    PT Stop Time 1015    PT Time Calculation (min) 47 min    Activity Tolerance Patient tolerated treatment well    Behavior During Therapy [facility] for tasks assessed/performed                    No past medical history on file.      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  Have done boxing, [employer] class, and walked the  dog several times (!).    PAIN:  Are you having pain? Yes: NPRS scale: 4/10 Pain location: L low back Pain description: soreness, knot of pain Aggravating factors: bending at low back Relieving factors: extending low back, Aleve        OBJECTIVE:    Lightheadedness is only there when head is moving.  Subtle, but more intense when walking or standing.  Rate as 6/10.  Not having to rest as much in dark rooms, not as much nausea.  Stopping moving, sitting down settles symptoms.  Tinnitus will develop during the day.  TODAY'S TREATMENT: 02/27/2022 Activity Comments  partial tandem stance with horizontal and vertical head motions, 2 reps x 30 sec Reviewed addition to HEP last visit, with pt return demo understanding.  Rates symptoms as close to 6/10; symptoms subside in 5-10 sec  Standing on Airex:  feet apart,  EO 30 sec, EC 30 seconds, 2 reps With EC, 3 episodes/3 episodes of forward LOB, needing UE support at counter  On Airex:  lateral weightshifting x 10 reps, then heel/toe raises x 10 reps; marching in place 2 x 10 reps, forward>back step and weightshift x 12 reps  On blue mat surface:  sidestepping R and L, with addition of head turns; tandem gait forward x 4 reps, then forward tandem gait with lateral sidesteps to correct balance, x 4 reps; wide BOS holding ball with diagonals to targets, 5 reps each side, then 5 reps/midline/5 reps each side Slowed head turns to coordinate with side step movements  Intermittent UE support for tandem gait activities   Rates symptoms as 2/10 with diagonals  Gait 60 ft x 6 reps with trunk rotation giving/taking ball (trunk height, diagonals high/low) Rates symptoms as 1-2/10  Discussed using walking, looking ahead at target or walking with gentle head motions/diagonals as way to "settle" dizziness and to continue to increase physical activity    Intermittent cues provided in session for static/dynamic standing activities to activate abdominal muscles  at neutral spine position to lessen L low back pain    Access Code: [email or web address] URL: [email or web address] Date: 08/015/2023-most recent (verbal) update Prepared by: [facility]  Program Notes Do exercises without glasses on; you will stand 2-3 ft from target; with the head movements, keep the target in focus.  You will likely bring on your symptoms to a 4-5/10 level or below.  Rest between sets to bring symptoms down to 0/10.  Exercises - Standing Gaze Stabilization with Head Rotation  - 3 x daily - 7 x weekly - 2 reps - 30 seconds hold (*progress to partial tandem stance) - Standing Gaze Stabilization with Head Nod  - 3 x daily - 7 x weekly - 2 reps - 30 sec hold (*progress to partial tandem stance) - Standing Quarter Turn with Counter Support  - 2 x daily - 7 x weekly - 2 reps - Feet Together Balance with Head Rotation  - 2 x daily - 7 x weekly - 1 sets - 5 reps - Romberg Stance with Head Nods  - 2 x daily - 7 x weekly - 1 sets - 5 reps - Heel Toe Raises with Counter Support  - 1 x daily - 7 x weekly - 2 sets - 10 reps - Standing Lumbar Spine Flexion Stretch Counter  - 1 x daily - 7 x weekly - 2 sets - 10 reps - Alternating Side Step  - 1 x daily - 7 x weekly - 2 sets - 10 reps - Alternating Step Backward with Support  - 1 x daily - 7 x weekly - 3 sets - 10 reps - Alternating Step Forward with Support  - 1 x daily - 7 x weekly - 2 sets - 10 reps - Standing Gastroc Stretch at Counter  - 2-3 x daily - 7 x weekly - 1 sets - 3 reps - 30 sec hold - Standing Bilateral Gastroc Stretch with Step  - 2-3 x daily - 7 x weekly - 1 sets - 3 reps - 15-30 sec hold    ______________________________________________ OBJECTIVE Measures from eval, 01/08/2022: COGNITION: Overall cognitive status: Within functional limits for tasks assessed               Cervical ROM:    Active A/PROM (deg) eval  Flexion    Extension    Right lateral flexion    Left  lateral flexion    Right rotation WFL  Left rotation WFL  (Blank rows = not tested)   STRENGTH: NT at eval   BED MOBILITY:  independent   TRANSFERS: Assistive device utilized: None  Sit to stand: Complete Independence Stand  to sit: Complete Independence GAIT: Gait pattern:  slowed, guarded, step through pattern, decreased arm swing- Right, and decreased arm swing- Left Distance walked: 60 ft Assistive device utilized: None Level of assistance: Modified independence Comments: Guarded gait pattern   PATIENT SURVEYS:  FOTO Score of 35 at intake/eval; predicted score 51     VESTIBULAR ASSESSMENT              GENERAL OBSERVATION: Moving very slowly                        SYMPTOM BEHAVIOR:                       Subjective history: Has had hx of lightheadedness in the past.  In the past several weeks, any movement makes me feel lightheaded.  This feels more like when I move my head, my eyes trail, and it makes me nauseous.  Wake up in the morning and it's not bad.  It's more when I'm still.                        Non-Vestibular symptoms: tinnitus and nausea/vomiting (tinnitus is synchronus with heartbeat-this is not new)                       Type of dizziness: Spinning/Vertigo, Unsteady with head/body turns, Lightheadedness/Faint, and "World moves"                       Frequency: multiple episodes/ongoing through the day                       Duration: cumulative throughout the day; lasts through the day                       Aggravating factors: Induced by position change: supine to sit and sit to supine, Induced by motion: turning body quickly, turning head quickly, and sitting in a moving car, Moving eyes, and tends to be worse                        Relieving factors: head stationary, lying supine, medication, slow movements, and sitting still                       Progression of symptoms: worse              OCULOMOTOR EXAM:  Rates symptoms initially as 1/10                        Ocular Alignment: normal                       Ocular ROM: No limitations;  and slight feeling of movement upon return/stop to midline.  Saccades more to R side than L.                       Spontaneous Nystagmus: absent                       Gaze-Induced Nystagmus: absent                       Smooth Pursuits: saccades more to R side than L  Saccades: intact                                               VESTIBULAR - OCULAR REFLEX:                        Slow VOR: Normal                       VOR Cancellation: Normal                       Head-Impulse Test: HIT Right: negative HIT Left: positive                       Dynamic Visual Acuity: Static: Line 7 Dynamic: Line 6 with 2-3/10 c/o dizziness                        POSITIONAL TESTING: Right Dix-Hallpike: upbeating, right nystagmus Left Dix-Hallpike: no nystagmus Right Roll Test: no nystagmus Left Roll Test: no nystagmus             RDH:  mild symptoms (eyelids twitch) and no c/o until return up to sit Right roll test:  negative, except brief symptoms upon return to sit.    ____________________________________________________________________________ GOALS: Goals reviewed with patient? Yes   SHORT TERM GOALS: Target date: 01/22/2022   Pt will report decrease in dizziness by at least 50% with functional mobility and throughout daily activities. Baseline:  Reports improvement by 1/3 overall Goal status: GOAL PROGRESSING   2.  Pt will be independent with initial HEP to reduce dizziness symptoms. Baseline:  Goal status: GOAL MET     LONG TERM GOALS: Target date: 02/05/2022>03/16/2022   Pt will be independent with progression of HEP for improved dizziness symptoms.  Baseline:  Goal status: GOAL MET   2.  FOTO score to improve to at least 51 for demonstration of improved functional outcome measure. Baseline: 35 at eval>48 02/01/2022 Goal status: PARTIALLY MET   3.  Pt will report no c/o dizziness with  functional mobility, turns, gait activities throughout the day. Baseline: not yet walking the dog; approx 60% movements during the day do not bring on dizziness Goal status: PARTIALLY MET, 02/01/2022   4.  FGA score to be assessed and pt to improve to >22/30. (Revised to 27/30) Baseline: 18/30> 24/30 02/01/2022 Goal status: GOAL MET, REVISED   5.  MCTSIB testing to be assessed and goal to be written as appropriate.  Perform condition 4 full 30 seconds and minimal sway. Baseline: 26.4 sec, mod sway Goal status: GOAL ONGOING     ASSESSMENT:   CLINICAL IMPRESSION: Addressed compliant surface balance, dynamic balance activities with addition of head motions and narrowed BOS/balance correction today.  Worked with gait on compliant and on level surfaces with head motions, with symptoms increasing to 1-2/10.  With quick head motions while static standing, pt's symptoms continue to increased to 6/10, with quick settling to baseline after 5-10 seconds.  With looking at conditions 3 and 4 of MCTSIB (standing on foam EO and EC), she has increased forward LOB with EC, needing UE support at counter to stabilize.   She will continue to benefit from skilled PT towards goals for improved overall vestibular function, improved mobility and decreased  fall risk.  OBJECTIVE IMPAIRMENTS Abnormal gait, decreased activity tolerance, decreased balance, decreased mobility, difficulty walking, and dizziness.    REHAB POTENTIAL: Good      PLAN: PT FREQUENCY: 1x/week   PT DURATION: 6 weeks, per recert 8/38/1840   PLANNED INTERVENTIONS: Therapeutic exercises, Therapeutic activity, Neuromuscular re-education, Balance training, Gait training, Patient/Family education, Vestibular training, and Canalith repositioning   PLAN FOR NEXT SESSION: Check LTGs for vestibular/balance.  Discuss again full PT re-eval (and based on pt's comfort level-include in POC to Dr. Anna Genre or could ask Dr. Quay Burow for order for PT for low back  for separate POC).  Continue balance work, balance strategies and limits of stability.       Mady Haagensen, PT 02/27/22 10:25 AM Phone: [phone] Fax: [phone]    [facility] at [facility] [place], [place], [place] [address] Phone # [phone] Fax # [phone]

## 2022-02-28 ENCOUNTER — Encounter: Payer: Self-pay | Admitting: Physical Therapy

## 2022-03-05 ENCOUNTER — Ambulatory Visit: Payer: Medicare PPO | Admitting: Physical Therapy

## 2022-03-05 ENCOUNTER — Encounter: Payer: Self-pay | Admitting: Physical Therapy

## 2022-03-05 DIAGNOSIS — R293 Abnormal posture: Secondary | ICD-10-CM

## 2022-03-05 DIAGNOSIS — M5459 Other low back pain: Secondary | ICD-10-CM

## 2022-03-05 DIAGNOSIS — R2681 Unsteadiness on feet: Secondary | ICD-10-CM

## 2022-03-05 DIAGNOSIS — R42 Dizziness and giddiness: Secondary | ICD-10-CM

## 2022-03-05 NOTE — Therapy (Addendum)
OUTPATIENT PHYSICAL THERAPY VESTIBULAR TREATMENT NOTE/RECERT/RE-EVALUATION   Patient Name: Dominique Griffin MRN: [id number] DOB:02/16/1951, 71 y.o., female Today's Date: 01/10/2022   PCP: Binnie Rail, MD REFERRING PROVIDER: Amanda Pea, MD        PT End of Session - 03/06/22 [phone]     Visit Number 11    Number of Visits 19    Date for PT Re-Evaluation 29/19/16   per recert 12/13/43 visit   [place] additional auth after 03/05/2022 visit    Authorization Time Period 02/20/2022-03/16/2022    Authorization - Visit Number 3    Authorization - Number of Visits 4    Progress Note Due on Visit 17   PN reported at visit 7   PT Start Time 1232    PT Stop Time 1317    PT Time Calculation (min) 45 min    Activity Tolerance Patient tolerated treatment well    Behavior During Therapy [facility] for tasks assessed/performed                     No past medical history on file.      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   Coccyx pain 09/27/2020   Parkinson's disease ([place]) 12/02/2019   Tremor 05/29/2019   Chronic fatigue 05/29/2019      ONSET DATE: 01/04/2022 MD referral   REFERRING DIAG: Dizziness and Giddiness   THERAPY DIAG:   Unsteadiness on feet  Other low back pain  Abnormal posture  Dizziness and giddiness   Rationale for Evaluation and Treatment Rehabilitation   PERTINENT HISTORY:  Parkinson's disease dx 2020, hx of low back pain; arthritis, chronic fatigue, Gilbert disease, osteopenia   PRECAUTIONS:  none   SUBJECTIVE: To go to the vestibular clinic at [facility] to assess further-to see about the possibility of Meneire's, since I feel the heartbeat and tinnitus.  Been boxing about 3 days/week.  Pulled weeds yesterday. *Pt reports her back pain seems to be limiting her with progressing of activities towards less dizziness.*  PAIN:  Are you having pain? Yes: NPRS scale: 4/10 Pain location: L low back Pain description: soreness, knot of pain Aggravating factors: bending at low back Relieving factors: extending low back, Aleve        OBJECTIVE: (RE-EVAL completed 03/05/2022 to address pt's c/o low back pain)  Posture Assessment: R shoulder lower than L shoulder Increased trunk lateral flexion at right trunk  Palpation: Tender to palpation at L   Lumbar ROM:  (Assessed in standing) Flexion:  25% limitation; initial pain with flexion-decreased pain at end range Extension:  WFL; no pain and pt reports improved pain in standing extension Right sidebending:  WFL Left sidebending:  25% limitation, mild c/o pain Trunk rotation:  slight limitation with mild c/o pain to L side  Repeated movements -pt reports decreased pain with standing repeated extension   Prone position: pt reports no c/o pain in prone position  Prone on elbows:  increased pain along bilateral low back musculature  Passive extension overpressures with pt in prone improve pain, pt reports relief   No radicular symptoms noted throughout exam  SLR position: negative LLE  Hamstring flexibility in supine with 90/90 start position: L:  -22 passively from full knee extension R -25 passively from full knee extension  MMT: R hip abduction:  4/5 L hip abduction:  4/5 R gluts/hip extension 3+/5 L gluts/hip extension 3+/5  Worked in quadruped for core stability, to find neutral spine and cues for abdominal activation with alt UE lifts x 5 reps, alt leg lifts x 5 reps, opposite arm/leg lifts x 2 reps.  Decreased stability  noted at hips with opposite arm/leg lifts.    PATIENT EDUCATION: Education details: Discussed findings assessment of low back pain and addition to POC to address low back pain in addition to progression with vestibular exercises, as pt feels back pain is limiting functional mobility; additions to HEP Person educated: Patient Education method: Explanation and Demonstration Education comprehension: verbalized understanding   Access Code: [email or web address] URL: [email or web address] Date: 03/06/2022-most recent additions Prepared by: [facility]     ______________________________________________ OBJECTIVE Measures from eval, 01/08/2022: COGNITION: Overall cognitive status: Within functional limits for tasks assessed               Cervical ROM:    Active A/PROM (deg) eval  Flexion    Extension    Right lateral flexion    Left lateral flexion    Right rotation WFL  Left rotation WFL  (Blank rows = not tested)   STRENGTH: NT at eval   BED MOBILITY:  independent   TRANSFERS: Assistive device utilized: None  Sit to stand: Complete Independence Stand to sit: Complete Independence GAIT: Gait pattern:  slowed, guarded, step through pattern, decreased arm swing- Right, and decreased arm swing- Left Distance walked: 60 ft Assistive device utilized: None Level of assistance: Modified independence Comments: Guarded gait pattern   PATIENT SURVEYS:  FOTO Score of 35 at intake/eval; predicted score 51     VESTIBULAR ASSESSMENT              GENERAL OBSERVATION: Moving very slowly                        SYMPTOM BEHAVIOR:                       Subjective history: Has had hx of lightheadedness in the past.  In the past several weeks, any movement makes me feel lightheaded.  This feels more like when I move my head, my eyes trail, and it makes me nauseous.  Wake up in the morning and it's not bad.  It's more when I'm still.                         Non-Vestibular symptoms: tinnitus and nausea/vomiting (tinnitus is synchronus with heartbeat-this is not new)                       Type of dizziness: Spinning/Vertigo, Unsteady with head/body turns, Lightheadedness/Faint, and "World moves"                       Frequency: multiple episodes/ongoing through the day  Duration: cumulative throughout the day; lasts through the day                       Aggravating factors: Induced by position change: supine to sit and sit to supine, Induced by motion: turning body quickly, turning head quickly, and sitting in a moving car, Moving eyes, and tends to be worse                        Relieving factors: head stationary, lying supine, medication, slow movements, and sitting still                       Progression of symptoms: worse              OCULOMOTOR EXAM:  Rates symptoms initially as 1/10                       Ocular Alignment: normal                       Ocular ROM: No limitations;  and slight feeling of movement upon return/stop to midline.  Saccades more to R side than L.                       Spontaneous Nystagmus: absent                       Gaze-Induced Nystagmus: absent                       Smooth Pursuits: saccades more to R side than L                       Saccades: intact                                               VESTIBULAR - OCULAR REFLEX:                        Slow VOR: Normal                       VOR Cancellation: Normal                       Head-Impulse Test: HIT Right: negative HIT Left: positive                       Dynamic Visual Acuity: Static: Line 7 Dynamic: Line 6 with 2-3/10 c/o dizziness                        POSITIONAL TESTING: Right Dix-Hallpike: upbeating, right nystagmus Left Dix-Hallpike: no nystagmus Right Roll Test: no nystagmus Left Roll Test: no nystagmus             RDH:  mild symptoms (eyelids twitch) and no c/o until return up to sit Right roll test:  negative,  except brief symptoms upon return to sit.    ____________________________________________________________________________ GOALS: Goals reviewed with patient? Yes   SHORT TERM GOALS: = LTGs (per recert/reassessment 3/78/5885)     LONG TERM GOALS: Updated Target  date: 04/06/2022   Pt will be independent with progression of HEP for improved dizziness symptoms and decreased low back pain.  Baseline:  Goal status: REVISED   2.  FOTO score to improve to at least 51 for demonstration of improved functional outcome measure. Baseline: 35 at eval>48 02/01/2022 Goal status: PARTIALLY MET, ONGOING   3.  Pt will report no c/o dizziness with functional mobility, turns, gait activities throughout the day. Baseline: not yet walking the dog; approx 60% movements during the day do not bring on dizziness Goal status: PARTIALLY MET/ONGOING, 02/01/2022   4.  FGA score to improve to >27/30 for decreased fall risk.  Baseline: 18/30> 24/30 02/01/2022 Goal status: GOAL REVISED   5.  MCTSIB testing to be assessed and goal to be written as appropriate.  Perform condition 4 full 30 seconds and minimal sway. Baseline: 26.4 sec, mod sway Goal status: GOAL ONGOING  6.  Pt will improve glut strength bilaterally to at least 4/5 to demonstrate improved stability and posture for decreased pain. Baseline:  4/5 Goal status:  INITIAL  7.  Pt will report pain as less than or equal to 2/10 during the day for improved participation in functional mobility, community exercise, progression to full return to community fitness Baseline:  4/10 Goal status:  INITIAL      ASSESSMENT:   CLINICAL IMPRESSION: Re-evaluation completed today to assess low back pain, as pt has consistently reported pain as at least 4/10 and it is interfering with pt's progression of vestibular exercises and overall functional mobility.  Pt with pain in L low back just laterally to L5/S1, abnormal posture, decreased glut strength, decreased  flexibility in lumbar spine.  Pt does not have any complaints of radicular symptoms and pain appears to improve with extensor movements.  Feel that addition to POC to address low back pain, lumbar and core stability will assist with improved posture, balance, and progression of vestibular, balance and gait activities.  Note additional goals and recert completed/sent to Dr. Anna Genre today.  OBJECTIVE IMPAIRMENTS Abnormal gait, decreased activity tolerance, decreased balance, decreased mobility, difficulty walking, and dizziness, pain, abnormal posture, decreased flexibility, decreased strength.    REHAB POTENTIAL: Good      PLAN: PT FREQUENCY: 2x/week   PT DURATION: 4 weeks, per recert 12/07/5613   PLANNED INTERVENTIONS: Therapeutic exercises, Therapeutic activity, Neuromuscular re-education, Balance training, Gait training, Patient/Family education, Vestibular training, and Canalith repositioning, Manual therapy, joint mobilization, iontophoresis, ultrasound, cryotherapy, moist heat   PLAN FOR NEXT SESSION: Review lumbar exercises and can work in supine for core stabilization and glut strengthening.  Look at full HEP (and consolidate/progress exercises for balance/vestibular system).  Continue to work on compliant surfaces, head motions, gait on unlevel surfaces       Mady Haagensen, PT 03/06/22 9:06 AM Phone: [phone] Fax: [place] [facility] at [facility] [place], [place],  [address] Phone # [phone] Fax # [phone]

## 2022-03-07 DIAGNOSIS — R42 Dizziness and giddiness: Secondary | ICD-10-CM | POA: Diagnosis not present

## 2022-03-14 ENCOUNTER — Ambulatory Visit: Payer: Medicare PPO | Attending: Neurology | Admitting: Physical Therapy

## 2022-03-14 ENCOUNTER — Encounter: Payer: Self-pay | Admitting: Physical Therapy

## 2022-03-14 DIAGNOSIS — M5459 Other low back pain: Secondary | ICD-10-CM | POA: Insufficient documentation

## 2022-03-14 DIAGNOSIS — R42 Dizziness and giddiness: Secondary | ICD-10-CM | POA: Diagnosis not present

## 2022-03-14 DIAGNOSIS — R293 Abnormal posture: Secondary | ICD-10-CM | POA: Diagnosis not present

## 2022-03-14 DIAGNOSIS — R2681 Unsteadiness on feet: Secondary | ICD-10-CM | POA: Insufficient documentation

## 2022-03-14 NOTE — Therapy (Addendum)
OUTPATIENT PHYSICAL THERAPY VESTIBULAR TREATMENT NOTE/RECERT/RE-EVALUATION   Patient Name: Dominique Griffin MRN: [id number] DOB:Mar 21, 1951, 71 y.o., female Today's Date: 01/10/2022   PCP: Binnie Rail, MD REFERRING PROVIDER: Amanda Pea, MD        PT End of Session - 03/14/22 1318     Visit Number 12    Number of Visits 19    Date for PT Re-Evaluation 18/28/83   per recert 3/74/4514 visit   [place] additional auth after 03/05/2022 visit    Authorization Time Period 03/05/2022-04/06/2022    Authorization - Visit Number 1    Authorization - Number of Visits 8    Progress Note Due on Visit 17   PN reported at visit 7   PT Start Time 1316    PT Stop Time 1358    PT Time Calculation (min) 42 min    Activity Tolerance Patient tolerated treatment well    Behavior During Therapy [facility] for tasks assessed/performed                      No past medical history on file.      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  Still would rate it as a 5/10.  It's more lightheadedness and swaying feeling. Pain is no where intense as it was.   PAIN:  Are you having pain? Yes: NPRS scale: 3-4/10 Pain location: L low back Pain description: soreness, knot of pain Aggravating factors: bending at low back Relieving factors: extending low back, Aleve        OBJECTIVE:    TODAY'S TREATMENT: 03/14/2022 Activity Comments  Bridging, 3 sec hold, 10 reps Cues for abdominal activation  Bridging with hip abduction/adduction 2 x 5 reps   Bridging with marching 5 reps Bridge position march each side and brief rest  SKTC 3 reps 10 sec; hip abduction/external rotation   Reviewed quadruped exercises from last visit:  alternating UE lifts x 3 reps, alt leg lifts x 5 reps each; progressed to opposite arm/leg lifts x 3 reps Improved stability with arm/leg lifts from last visit.  Seated ant/posterior (to neutral) pelvic tilts 10 reps   Core stabilization:  sitting with opposite leg lifts 2 x 5 reps;  Cues for small movement patterns to maintain abdominal activation  REviewed HEP: Semitandem position with head motions,  2 x 30 sec Standing on Airex feet apart head motions Standing on Airex:  forward step and weightshift (cues for increased step length, heelstrike, foot clearance); side step and weightshift, back step and weightshift Heel/toe raises (used as strategy for balance recovery while standing on Airex and head motions) Cues for intermittent UE support as needed for improved stability.  Cues throughout for abdominal activation between sets for more neutral posture, less reps if needed to offset back pain and fatigue in lumbar musculature         PATIENT EDUCATION: Education details: Continue current HEP with focus on abdominal activation, upright posture in standing exercises.  Can progress to opp arm/leg lifts in quadruped at home Person  educated: Patient Education method: Explanation, Demonstration, and Verbal cues Education comprehension: verbalized understanding and returned demonstration   ----------------------------------------------------------------------------- (RE-EVAL completed 03/05/2022 to address pt's c/o low back pain)  Posture Assessment: R shoulder lower than L shoulder Increased trunk lateral flexion at right trunk  Palpation: Tender to palpation at L   Lumbar ROM:  (Assessed in standing) Flexion:  25% limitation; initial pain with flexion-decreased pain at end range Extension:  WFL; no pain and pt reports improved pain in standing extension Right sidebending:  WFL Left sidebending:  25% limitation, mild c/o pain Trunk rotation:  slight limitation with mild c/o pain to L side  Repeated movements -pt reports decreased pain with standing repeated extension   Prone position: pt reports no c/o pain in prone position Prone on elbows:  increased pain along bilateral low back musculature  Passive extension overpressures with pt in prone improve pain, pt reports relief   No radicular symptoms noted throughout exam  SLR position: negative LLE  Hamstring flexibility in supine with 90/90 start position: L:  -22 passively from full knee extension R -25 passively from full knee extension  MMT: R hip abduction:  4/5 L hip abduction:  4/5 R gluts/hip extension 3+/5 L gluts/hip extension 3+/5  Worked in quadruped for core stability, to find neutral spine and cues for abdominal activation with alt UE lifts x 5 reps, alt leg lifts x 5 reps, opposite arm/leg lifts x 2 reps.  Decreased stability noted at hips with opposite arm/leg lifts.    PATIENT EDUCATION: Education details: Discussed findings assessment of low back pain and addition to POC to address low back pain in addition to progression with vestibular exercises, as pt feels back pain is limiting functional mobility; additions to HEP Person  educated: Patient Education method: Explanation and Demonstration Education comprehension: verbalized understanding   Access Code: [email or web address] URL: [email or web address] Date: 03/06/2022-most recent additions Prepared by: [facility]     ______________________________________________ OBJECTIVE Measures from eval, 01/08/2022: COGNITION: Overall cognitive status: Within functional limits for tasks assessed               Cervical ROM:    Active A/PROM (deg) eval  Flexion    Extension    Right lateral flexion    Left lateral flexion    Right rotation WFL  Left rotation WFL  (Blank rows = not tested)   STRENGTH: NT at eval   BED MOBILITY:  independent   TRANSFERS: Assistive device utilized: None  Sit to stand: Complete Independence Stand to sit: Complete Independence GAIT: Gait pattern:  slowed, guarded, step through pattern, decreased arm swing- Right, and decreased arm swing- Left Distance walked: 60 ft Assistive device utilized: None Level of assistance: Modified independence Comments: Guarded gait pattern  PATIENT SURVEYS:  FOTO Score of 35 at intake/eval; predicted score 51     VESTIBULAR ASSESSMENT              GENERAL OBSERVATION: Moving very slowly                        SYMPTOM BEHAVIOR:                       Subjective history: Has had hx of lightheadedness in the past.  In the past several weeks, any movement makes me feel lightheaded.  This feels more like when I move my head, my eyes trail, and it makes me nauseous.  Wake up in the morning and it's not bad.  It's more when I'm still.                        Non-Vestibular symptoms: tinnitus and nausea/vomiting (tinnitus is synchronus with heartbeat-this is not new)                       Type of dizziness: Spinning/Vertigo, Unsteady with head/body turns, Lightheadedness/Faint, and "World moves"                       Frequency: multiple episodes/ongoing  through the day                       Duration: cumulative throughout the day; lasts through the day                       Aggravating factors: Induced by position change: supine to sit and sit to supine, Induced by motion: turning body quickly, turning head quickly, and sitting in a moving car, Moving eyes, and tends to be worse                        Relieving factors: head stationary, lying supine, medication, slow movements, and sitting still                       Progression of symptoms: worse              OCULOMOTOR EXAM:  Rates symptoms initially as 1/10                       Ocular Alignment: normal                       Ocular ROM: No limitations;  and slight feeling of movement upon return/stop to midline.  Saccades more to R side than L.                       Spontaneous Nystagmus: absent                       Gaze-Induced Nystagmus: absent                       Smooth Pursuits: saccades more to R side than L                       Saccades: intact  VESTIBULAR - OCULAR REFLEX:                        Slow VOR: Normal                       VOR Cancellation: Normal                       Head-Impulse Test: HIT Right: negative HIT Left: positive                       Dynamic Visual Acuity: Static: Line 7 Dynamic: Line 6 with 2-3/10 c/o dizziness                        POSITIONAL TESTING: Right Dix-Hallpike: upbeating, right nystagmus Left Dix-Hallpike: no nystagmus Right Roll Test: no nystagmus Left Roll Test: no nystagmus             RDH:  mild symptoms (eyelids twitch) and no c/o until return up to sit Right roll test:  negative, except brief symptoms upon return to sit.    ____________________________________________________________________________ GOALS: Goals reviewed with patient? Yes   SHORT TERM GOALS: = LTGs (per recert/reassessment 02/19/4817)     LONG TERM GOALS: Updated Target date: 04/06/2022   Pt will be independent  with progression of HEP for improved dizziness symptoms and decreased low back pain.  Baseline:  Goal status: REVISED   2.  FOTO score to improve to at least 51 for demonstration of improved functional outcome measure. Baseline: 35 at eval>48 02/01/2022 Goal status: PARTIALLY MET, ONGOING   3.  Pt will report no c/o dizziness with functional mobility, turns, gait activities throughout the day. Baseline: not yet walking the dog; approx 60% movements during the day do not bring on dizziness Goal status: PARTIALLY MET/ONGOING, 02/01/2022   4.  FGA score to improve to >27/30 for decreased fall risk.  Baseline: 18/30> 24/30 02/01/2022 Goal status: GOAL REVISED   5.  MCTSIB testing to be assessed and goal to be written as appropriate.  Perform condition 4 full 30 seconds and minimal sway. Baseline: 26.4 sec, mod sway Goal status: GOAL ONGOING  6.  Pt will improve glut strength bilaterally to at least 4/5 to demonstrate improved stability and posture for decreased pain. Baseline:  4/5 Goal status:  INITIAL  7.  Pt will report pain as less than or equal to 2/10 during the day for improved participation in functional mobility, community exercise, progression to full return to community fitness Baseline:  4/10 Goal status:  INITIAL      ASSESSMENT:   CLINICAL IMPRESSION: Skilled PT session today focused on core stabilization with abdominal activation in varied positions, including supine, seated, quadruped.  Pt with improved stability in quadruped alternating UE/leg lifts compared to last visit.  She responds well to cues for initial abdominal activation and does better with less reps in order to hold abdominal activation.  Also reviewed HEP for balance/vestibular symptoms, with pt's most significant complaint being unsteadiness/mild lightheadedness.  With standing balance exercises, with cues for abdominal/glut activation, pt reports more steadiness.  She reports no change in back pain with  multiple positional activiteis today, but overall decrease in pain since onset.  She will continue to benefit from skilled PT towards goals addressing core and lumbar stability for decreased back pain and overall improved vestibular/balance symptoms for full return to community activities.  OBJECTIVE IMPAIRMENTS Abnormal gait, decreased activity tolerance, decreased balance, decreased mobility, difficulty walking, and dizziness, pain, abnormal posture, decreased flexibility, decreased strength.    REHAB POTENTIAL: Good      PLAN: PT FREQUENCY: 2x/week   PT DURATION: 4 weeks, per recert 8/47/2072   PLANNED INTERVENTIONS: Therapeutic exercises, Therapeutic activity, Neuromuscular re-education, Balance training, Gait training, Patient/Family education, Vestibular training, and Canalith repositioning, Manual therapy, joint mobilization, iontophoresis, ultrasound, cryotherapy, moist heat   PLAN FOR NEXT SESSION: Continue to work on lumbar stabilization/abdominal activation exercises and can work in supine for core stabilization and glut strengthening.  Progress quadruped/prone exercises as appropriate.  Continue to work on compliant surfaces, head motions, gait on unlevel surfaces       Mady Haagensen, PT 03/14/22 4:39 PM Phone: [phone] Fax: [place] [facility] [address], [place], [place] [address] Phone # [phone] Fax # [phone]

## 2022-03-15 ENCOUNTER — Encounter: Payer: Self-pay | Admitting: Internal Medicine

## 2022-03-19 ENCOUNTER — Ambulatory Visit: Payer: Medicare PPO

## 2022-03-19 DIAGNOSIS — M5459 Other low back pain: Secondary | ICD-10-CM

## 2022-03-19 DIAGNOSIS — R293 Abnormal posture: Secondary | ICD-10-CM

## 2022-03-19 DIAGNOSIS — R2681 Unsteadiness on feet: Secondary | ICD-10-CM | POA: Diagnosis not present

## 2022-03-19 DIAGNOSIS — R42 Dizziness and giddiness: Secondary | ICD-10-CM

## 2022-03-19 NOTE — Therapy (Addendum)
OUTPATIENT PHYSICAL THERAPY VESTIBULAR/NEURO TREATMENT    Patient Name: Dominique Griffin MRN: [id number] DOB:[birth year], 71 y.o., female Today's Date: 01/10/2022   PCP: Binnie Rail, MD REFERRING PROVIDER: Amanda Pea, MD        PT End of Session - 03/19/22 1356     Visit Number 13    Number of Visits 19    Date for PT Re-Evaluation 21/82/88   per recert 3/37/4451 visit   [place] additional auth after 03/05/2022 visit    Authorization Time Period 03/05/2022-04/06/2022    Authorization - Visit Number 2    Authorization - Number of Visits 8    Progress Note Due on Visit 17   PN reported at visit 7   PT Start Time 1400    PT Stop Time 1445    PT Time Calculation (min) 45 min    Equipment Utilized During Treatment Gait belt    Activity Tolerance Patient tolerated treatment well    Behavior During Therapy [facility] for tasks assessed/performed                      No past medical history on file.      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   Coccyx pain 09/27/2020   Parkinson's disease ([place]) 12/02/2019   Tremor 05/29/2019   Chronic fatigue 05/29/2019      ONSET DATE: 01/04/2022 MD referral   REFERRING DIAG: Dizziness and Giddiness   THERAPY DIAG:  Abnormal posture Other low back pain Unsteadiness on feet Dizziness and giddiness   Rationale for Evaluation and Treatment Rehabilitation   PERTINENT HISTORY:  Parkinson's disease dx 2020, hx of low back pain; arthritis, chronic fatigue, Gilbert disease,  osteopenia   PRECAUTIONS: none   SUBJECTIVE: Back is feeling more localized and the balance issues/disequilibrium seems to be stable and not as disorienting  PAIN:  Are you having pain? Yes: NPRS scale: 3-4/10 Pain location: L low back Pain description: soreness, knot of pain Aggravating factors: bending at low back Relieving factors: extending low back, Aleve        OBJECTIVE:   TODAY'S TREATMENT: 03/19/22 Activity Comments  Farmer's march 4x20 ft 8 lbs Cues for core activation and single limb balance  Sit-stand 3x5 8 lbs goblet 1x5 ipsilat hurdle tap, 1x5 contralat  Standing on foam -throwing lettered ball up and fixating on target 20x -VOR cancellation 10x -VOR x 1 horizontal: central target, left-bias, right-bias x 30 sec  Bird-dog 2x10 Modified standing at counter  Paloff press 2x10 Green t-band/loop  Hip hinge/deadlift Use of dowel rod for spine posture/cues     TODAY'S TREATMENT: 03/14/2022 Activity Comments  Bridging, 3 sec hold, 10 reps Cues for abdominal activation  Bridging with hip abduction/adduction 2 x 5 reps   Bridging with marching 5 reps Bridge position march each side and brief rest  SKTC 3 reps 10 sec; hip abduction/external rotation   Reviewed quadruped  exercises from last visit:  alternating UE lifts x 3 reps, alt leg lifts x 5 reps each; progressed to opposite arm/leg lifts x 3 reps Improved stability with arm/leg lifts from last visit.  Seated ant/posterior (to neutral) pelvic tilts 10 reps   Core stabilization:  sitting with opposite leg lifts 2 x 5 reps;  Cues for small movement patterns to maintain abdominal activation  REviewed HEP: Semitandem position with head motions, 2 x 30 sec Standing on Airex feet apart head motions Standing on Airex:  forward step and weightshift (cues for increased step length, heelstrike, foot clearance); side step and weightshift, back step and weightshift Heel/toe raises (used as strategy for balance recovery while  standing on Airex and head motions) Cues for intermittent UE support as needed for improved stability.  Cues throughout for abdominal activation between sets for more neutral posture, less reps if needed to offset back pain and fatigue in lumbar musculature         PATIENT EDUCATION: Education details: Continue current HEP with focus on abdominal activation, upright posture in standing exercises.  Can progress to opp arm/leg lifts in quadruped at home Person educated: Patient Education method: Explanation, Demonstration, and Verbal cues Education comprehension: verbalized understanding and returned demonstration   ----------------------------------------------------------------------------- (RE-EVAL completed 03/05/2022 to address pt's c/o low back pain)  Posture Assessment: R shoulder lower than L shoulder Increased trunk lateral flexion at right trunk  Palpation: Tender to palpation at L   Lumbar ROM:  (Assessed in standing) Flexion:  25% limitation; initial pain with flexion-decreased pain at end range Extension:  WFL; no pain and pt reports improved pain in standing extension Right sidebending:  WFL Left sidebending:  25% limitation, mild c/o pain Trunk rotation:  slight limitation with mild c/o pain to L side  Repeated movements -pt reports decreased pain with standing repeated extension   Prone position: pt reports no c/o pain in prone position Prone on elbows:  increased pain along bilateral low back musculature  Passive extension overpressures with pt in prone improve pain, pt reports relief   No radicular symptoms noted throughout exam  SLR position: negative LLE  Hamstring flexibility in supine with 90/90 start position: L:  -22 passively from full knee extension R -25 passively from full knee extension  MMT: R hip abduction:  4/5 L hip abduction:  4/5 R gluts/hip extension 3+/5 L gluts/hip extension 3+/5  Worked in quadruped for core stability, to find  neutral spine and cues for abdominal activation with alt UE lifts x 5 reps, alt leg lifts x 5 reps, opposite arm/leg lifts x 2 reps.  Decreased stability noted at hips with opposite arm/leg lifts.    PATIENT EDUCATION: Education details: Discussed findings assessment of low back pain and addition to POC to address low back pain in addition to progression with vestibular exercises, as pt feels back pain is limiting functional mobility; additions to HEP Person educated: Patient Education method: Explanation and Demonstration Education comprehension: verbalized understanding   Access Code: [email or web address] URL: [email or web address] Date: 03/06/2022-most recent additions Prepared by: [facility]     ______________________________________________ OBJECTIVE Measures from eval, 01/08/2022: COGNITION: Overall cognitive status: Within functional limits for tasks assessed               Cervical ROM:    Active A/PROM (deg) eval  Flexion    Extension    Right lateral flexion    Left lateral flexion    Right rotation [facility]  Left rotation WFL  (Blank rows = not tested)   STRENGTH: NT at eval   BED MOBILITY:  independent   TRANSFERS: Assistive device utilized: None  Sit to stand: Complete Independence Stand to sit: Complete Independence GAIT: Gait pattern:  slowed, guarded, step through pattern, decreased arm swing- Right, and decreased arm swing- Left Distance walked: 60 ft Assistive device utilized: None Level of assistance: Modified independence Comments: Guarded gait pattern   PATIENT SURVEYS:  FOTO Score of 35 at intake/eval; predicted score 51     VESTIBULAR ASSESSMENT              GENERAL OBSERVATION: Moving very slowly                        SYMPTOM BEHAVIOR:                       Subjective history: Has had hx of lightheadedness in the past.  In the past several weeks, any movement makes me feel lightheaded.  This feels  more like when I move my head, my eyes trail, and it makes me nauseous.  Wake up in the morning and it's not bad.  It's more when I'm still.                        Non-Vestibular symptoms: tinnitus and nausea/vomiting (tinnitus is synchronus with heartbeat-this is not new)                       Type of dizziness: Spinning/Vertigo, Unsteady with head/body turns, Lightheadedness/Faint, and "World moves"                       Frequency: multiple episodes/ongoing through the day                       Duration: cumulative throughout the day; lasts through the day                       Aggravating factors: Induced by position change: supine to sit and sit to supine, Induced by motion: turning body quickly, turning head quickly, and sitting in a moving car, Moving eyes, and tends to be worse                        Relieving factors: head stationary, lying supine, medication, slow movements, and sitting still                       Progression of symptoms: worse              OCULOMOTOR EXAM:  Rates symptoms initially as 1/10                       Ocular Alignment: normal                       Ocular ROM: No limitations;  and slight feeling of movement upon return/stop to midline.  Saccades more to R side than L.                       Spontaneous Nystagmus: absent  Gaze-Induced Nystagmus: absent                       Smooth Pursuits: saccades more to R side than L                       Saccades: intact                                               VESTIBULAR - OCULAR REFLEX:                        Slow VOR: Normal                       VOR Cancellation: Normal                       Head-Impulse Test: HIT Right: negative HIT Left: positive                       Dynamic Visual Acuity: Static: Line 7 Dynamic: Line 6 with 2-3/10 c/o dizziness                        POSITIONAL TESTING: Right Dix-Hallpike: upbeating, right nystagmus Left Dix-Hallpike: no nystagmus Right Roll Test: no  nystagmus Left Roll Test: no nystagmus             RDH:  mild symptoms (eyelids twitch) and no c/o until return up to sit Right roll test:  negative, except brief symptoms upon return to sit.    ____________________________________________________________________________ GOALS: Goals reviewed with patient? Yes   SHORT TERM GOALS: = LTGs (per recert/reassessment 6/59/9357)     LONG TERM GOALS: Updated Target date: 04/06/2022   Pt will be independent with progression of HEP for improved dizziness symptoms and decreased low back pain.  Baseline:  Goal status: REVISED   2.  FOTO score to improve to at least 51 for demonstration of improved functional outcome measure. Baseline: 35 at eval>48 02/01/2022 Goal status: PARTIALLY MET, ONGOING   3.  Pt will report no c/o dizziness with functional mobility, turns, gait activities throughout the day. Baseline: not yet walking the dog; approx 60% movements during the day do not bring on dizziness Goal status: PARTIALLY MET/ONGOING, 02/01/2022   4.  FGA score to improve to >27/30 for decreased fall risk.  Baseline: 18/30> 24/30 02/01/2022 Goal status: GOAL REVISED   5.  MCTSIB testing to be assessed and goal to be written as appropriate.  Perform condition 4 full 30 seconds and minimal sway. Baseline: 26.4 sec, mod sway Goal status: GOAL ONGOING  6.  Pt will improve glut strength bilaterally to at least 4/5 to demonstrate improved stability and posture for decreased pain. Baseline:  4/5 Goal status:  INITIAL  7.  Pt will report pain as less than or equal to 2/10 during the day for improved participation in functional mobility, community exercise, progression to full return to community fitness Baseline:  4/10 Goal status:  INITIAL      ASSESSMENT:   CLINICAL IMPRESSION: Pt notes pain and dysfunction when performing upper body activities with trunk flexion bias upon observation demo excessive use of trunk flexion vs hip engagement.  Good  tolerance and ability  to reconfigure body mechanics using dowel rod for external cue in practiced hip hinge which improved hip extension moment vs lumbar flexion with pt reporting minimal discomfort in lumbar spine.  Difficulty with single limb support during activities requiring default to wide BOS and postural instability with single leg bias, especially during transitional movements, e.g. sit to stand to cone tap requiring increased double limb support for motor planning. Continued sessions indicated to progress HEP and improve pain-minimized activity participation  OBJECTIVE IMPAIRMENTS Abnormal gait, decreased activity tolerance, decreased balance, decreased mobility, difficulty walking, and dizziness, pain, abnormal posture, decreased flexibility, decreased strength.    REHAB POTENTIAL: Good      PLAN: PT FREQUENCY: 2x/week   PT DURATION: 4 weeks, per recert 02/18/8870   PLANNED INTERVENTIONS: Therapeutic exercises, Therapeutic activity, Neuromuscular re-education, Balance training, Gait training, Patient/Family education, Vestibular training, and Canalith repositioning, Manual therapy, joint mobilization, iontophoresis, ultrasound, cryotherapy, moist heat   PLAN FOR NEXT SESSION: Continue to work on lumbar stabilization/abdominal activation exercises and can work in supine for core stabilization and glut strengthening.  Progress quadruped/prone exercises as appropriate.  Continue to work on compliant surfaces, head motions, gait on unlevel surfaces      2:57 PM, 03/19/22 M. Sherlyn Lees, PT, DPT Physical Therapist- [place] Office Number: [phone]   [facility] [address] [place], [place] [address] Phone # [phone] Fax # [phone]

## 2022-03-21 ENCOUNTER — Ambulatory Visit: Payer: Medicare PPO

## 2022-03-21 DIAGNOSIS — R2681 Unsteadiness on feet: Secondary | ICD-10-CM | POA: Diagnosis not present

## 2022-03-21 DIAGNOSIS — M5459 Other low back pain: Secondary | ICD-10-CM | POA: Diagnosis not present

## 2022-03-21 DIAGNOSIS — R293 Abnormal posture: Secondary | ICD-10-CM

## 2022-03-21 DIAGNOSIS — R42 Dizziness and giddiness: Secondary | ICD-10-CM

## 2022-03-21 NOTE — Therapy (Addendum)
OUTPATIENT PHYSICAL THERAPY VESTIBULAR/NEURO TREATMENT    Patient Name: Dominique Griffin MRN: [id number] DOB:Nov 25, 1950, 71 y.o., female Today's Date: 01/10/2022   PCP: Binnie Rail, MD REFERRING PROVIDER: Amanda Pea, MD        PT End of Session - 03/21/22 1407     Visit Number 14    Number of Visits 19    Date for PT Re-Evaluation 65/03/54   per recert 6/56/8127 visit   [place] additional auth after 03/05/2022 visit    Authorization Time Period 03/05/2022-04/06/2022    Authorization - Visit Number 3    Authorization - Number of Visits 8    Progress Note Due on Visit 17   PN reported at visit 7   PT Start Time 1406    PT Stop Time 1445    PT Time Calculation (min) 39 min    Equipment Utilized During Treatment Gait belt    Activity Tolerance Patient tolerated treatment well    Behavior During Therapy [facility] for tasks assessed/performed                      No past medical history on file.      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   Coccyx pain 09/27/2020   Parkinson's disease ([place]) 12/02/2019   Tremor 05/29/2019   Chronic fatigue 05/29/2019      ONSET DATE: 01/04/2022 MD referral   REFERRING DIAG: Dizziness and Giddiness   THERAPY DIAG:  Abnormal posture Other low back pain Unsteadiness on feet Dizziness and giddiness   Rationale for Evaluation and Treatment Rehabilitation   PERTINENT HISTORY:  Parkinson's disease dx 2020, hx of low back pain; arthritis, chronic fatigue, Gilbert disease,  osteopenia   PRECAUTIONS: none   SUBJECTIVE: low back is feeling stiff and sore  PAIN:  Are you having pain? Yes: NPRS scale: 3-4/10 Pain location: L low back Pain description: soreness, knot of pain Aggravating factors: bending at low back Relieving factors: extending low back, Aleve        OBJECTIVE:   TODAY'S TREATMENT: 03/21/22 Activity Comments  Lumbar trunk rotations x 2 min W/ MHP  Bridges 1x10 Foot flat, heel bridge, butterfly  Dying bug 10x   Anti-rotation with bicycle leg kick Green band  Prone press-ups/elbows   Paloff press walk-outs      TODAY'S TREATMENT: 03/19/22 Activity Comments  Farmer's march 4x20 ft 8 lbs Cues for core activation and single limb balance  Sit-stand 3x5 8 lbs goblet 1x5 ipsilat hurdle tap, 1x5 contralat  Standing on foam -throwing lettered ball up and fixating on target 20x -VOR cancellation 10x -VOR x 1 horizontal: central target, left-bias, right-bias x 30 sec  Bird-dog 2x10 Modified standing at counter  Paloff press 2x10 Green t-band/loop  Hip hinge/deadlift Use of dowel rod for spine posture/cues     TODAY'S TREATMENT: 03/14/2022 Activity Comments  Bridging, 3 sec hold, 10 reps Cues for abdominal  activation  Bridging with hip abduction/adduction 2 x 5 reps   Bridging with marching 5 reps Bridge position march each side and brief rest  SKTC 3 reps 10 sec; hip abduction/external rotation   Reviewed quadruped exercises from last visit:  alternating UE lifts x 3 reps, alt leg lifts x 5 reps each; progressed to opposite arm/leg lifts x 3 reps Improved stability with arm/leg lifts from last visit.  Seated ant/posterior (to neutral) pelvic tilts 10 reps   Core stabilization:  sitting with opposite leg lifts 2 x 5 reps;  Cues for small movement patterns to maintain abdominal activation  REviewed HEP: Semitandem position with head motions, 2 x 30 sec Standing on Airex feet apart head motions Standing on Airex:  forward step and weightshift  (cues for increased step length, heelstrike, foot clearance); side step and weightshift, back step and weightshift Heel/toe raises (used as strategy for balance recovery while standing on Airex and head motions) Cues for intermittent UE support as needed for improved stability.  Cues throughout for abdominal activation between sets for more neutral posture, less reps if needed to offset back pain and fatigue in lumbar musculature         PATIENT EDUCATION: Education details: Continue current HEP with focus on abdominal activation, upright posture in standing exercises.  Can progress to opp arm/leg lifts in quadruped at home Person educated: Patient Education method: Explanation, Demonstration, and Verbal cues Education comprehension: verbalized understanding and returned demonstration   ----------------------------------------------------------------------------- (RE-EVAL completed 03/05/2022 to address pt's c/o low back pain)  Posture Assessment: R shoulder lower than L shoulder Increased trunk lateral flexion at right trunk  Palpation: Tender to palpation at L   Lumbar ROM:  (Assessed in standing) Flexion:  25% limitation; initial pain with flexion-decreased pain at end range Extension:  WFL; no pain and pt reports improved pain in standing extension Right sidebending:  WFL Left sidebending:  25% limitation, mild c/o pain Trunk rotation:  slight limitation with mild c/o pain to L side  Repeated movements -pt reports decreased pain with standing repeated extension   Prone position: pt reports no c/o pain in prone position Prone on elbows:  increased pain along bilateral low back musculature  Passive extension overpressures with pt in prone improve pain, pt reports relief   No radicular symptoms noted throughout exam  SLR position: negative LLE  Hamstring flexibility in supine with 90/90 start position: L:  -22 passively from full knee extension R -25 passively from full  knee extension  MMT: R hip abduction:  4/5 L hip abduction:  4/5 R gluts/hip extension 3+/5 L gluts/hip extension 3+/5  Worked in quadruped for core stability, to find neutral spine and cues for abdominal activation with alt UE lifts x 5 reps, alt leg lifts x 5 reps, opposite arm/leg lifts x 2 reps.  Decreased stability noted at hips with opposite arm/leg lifts.    PATIENT EDUCATION: Education details: Discussed findings assessment of low back pain and addition to POC to address low back pain in addition to progression with vestibular exercises, as pt feels back pain is limiting functional mobility; additions to HEP Person educated: Patient Education method: Explanation and Demonstration Education comprehension: verbalized understanding   Access Code: [email or web address] URL: [email or web address] Date: 03/06/2022-most recent additions Prepared by: [facility]     ______________________________________________ OBJECTIVE Measures from eval, 01/08/2022: COGNITION: Overall cognitive status: Within functional limits for tasks assessed  Cervical ROM:    Active A/PROM (deg) eval  Flexion    Extension    Right lateral flexion    Left lateral flexion    Right rotation WFL  Left rotation WFL  (Blank rows = not tested)   STRENGTH: NT at eval   BED MOBILITY:  independent   TRANSFERS: Assistive device utilized: None  Sit to stand: Complete Independence Stand to sit: Complete Independence GAIT: Gait pattern:  slowed, guarded, step through pattern, decreased arm swing- Right, and decreased arm swing- Left Distance walked: 60 ft Assistive device utilized: None Level of assistance: Modified independence Comments: Guarded gait pattern   PATIENT SURVEYS:  FOTO Score of 35 at intake/eval; predicted score 51     VESTIBULAR ASSESSMENT              GENERAL OBSERVATION: Moving very slowly                        SYMPTOM  BEHAVIOR:                       Subjective history: Has had hx of lightheadedness in the past.  In the past several weeks, any movement makes me feel lightheaded.  This feels more like when I move my head, my eyes trail, and it makes me nauseous.  Wake up in the morning and it's not bad.  It's more when I'm still.                        Non-Vestibular symptoms: tinnitus and nausea/vomiting (tinnitus is synchronus with heartbeat-this is not new)                       Type of dizziness: Spinning/Vertigo, Unsteady with head/body turns, Lightheadedness/Faint, and "World moves"                       Frequency: multiple episodes/ongoing through the day                       Duration: cumulative throughout the day; lasts through the day                       Aggravating factors: Induced by position change: supine to sit and sit to supine, Induced by motion: turning body quickly, turning head quickly, and sitting in a moving car, Moving eyes, and tends to be worse                        Relieving factors: head stationary, lying supine, medication, slow movements, and sitting still                       Progression of symptoms: worse              OCULOMOTOR EXAM:  Rates symptoms initially as 1/10                       Ocular Alignment: normal                       Ocular ROM: No limitations;  and slight feeling of movement upon return/stop to midline.  Saccades more to R side than L.  Spontaneous Nystagmus: absent                       Gaze-Induced Nystagmus: absent                       Smooth Pursuits: saccades more to R side than L                       Saccades: intact                                               VESTIBULAR - OCULAR REFLEX:                        Slow VOR: Normal                       VOR Cancellation: Normal                       Head-Impulse Test: HIT Right: negative HIT Left: positive                       Dynamic Visual Acuity: Static: Line 7 Dynamic:  Line 6 with 2-3/10 c/o dizziness                        POSITIONAL TESTING: Right Dix-Hallpike: upbeating, right nystagmus Left Dix-Hallpike: no nystagmus Right Roll Test: no nystagmus Left Roll Test: no nystagmus             RDH:  mild symptoms (eyelids twitch) and no c/o until return up to sit Right roll test:  negative, except brief symptoms upon return to sit.    ____________________________________________________________________________ GOALS: Goals reviewed with patient? Yes   SHORT TERM GOALS: = LTGs (per recert/reassessment 0/25/4270)     LONG TERM GOALS: Updated Target date: 04/06/2022   Pt will be independent with progression of HEP for improved dizziness symptoms and decreased low back pain.  Baseline:  Goal status: REVISED   2.  FOTO score to improve to at least 51 for demonstration of improved functional outcome measure. Baseline: 35 at eval>48 02/01/2022 Goal status: PARTIALLY MET, ONGOING   3.  Pt will report no c/o dizziness with functional mobility, turns, gait activities throughout the day. Baseline: not yet walking the dog; approx 60% movements during the day do not bring on dizziness Goal status: PARTIALLY MET/ONGOING, 02/01/2022   4.  FGA score to improve to >27/30 for decreased fall risk.  Baseline: 18/30> 24/30 02/01/2022 Goal status: GOAL REVISED   5.  MCTSIB testing to be assessed and goal to be written as appropriate.  Perform condition 4 full 30 seconds and minimal sway. Baseline: 26.4 sec, mod sway Goal status: GOAL ONGOING  6.  Pt will improve glut strength bilaterally to at least 4/5 to demonstrate improved stability and posture for decreased pain. Baseline:  4/5 Goal status:  INITIAL  7.  Pt will report pain as less than or equal to 2/10 during the day for improved participation in functional mobility, community exercise, progression to full return to community fitness Baseline:  4/10 Goal status:  INITIAL      ASSESSMENT:   CLINICAL  IMPRESSION: Pt reports no positional vertigo,  lightheadedness/dizziness when upright/moving. Progressing with core stabilization activities with emphasis on anti-rotation resistance and progressing to generalize this to compound lifts.  Demo limited isolated/articulated lumabr extension during prone movements, exhibits > 45 degrees internal hip rotation and tenders to palpation left piriformis which may be from lack of strength/stability for external rotation. Continued sessions to progress balance, core strength, and functional lifts to reduce risk for back injury   OBJECTIVE IMPAIRMENTS Abnormal gait, decreased activity tolerance, decreased balance, decreased mobility, difficulty walking, and dizziness, pain, abnormal posture, decreased flexibility, decreased strength.    REHAB POTENTIAL: Good      PLAN: PT FREQUENCY: 2x/week   PT DURATION: 4 weeks, per recert 5/59/7416   PLANNED INTERVENTIONS: Therapeutic exercises, Therapeutic activity, Neuromuscular re-education, Balance training, Gait training, Patient/Family education, Vestibular training, and Canalith repositioning, Manual therapy, joint mobilization, iontophoresis, ultrasound, cryotherapy, moist heat   PLAN FOR NEXT SESSION: deadlift, front squat, squat-to-press?    2:07 PM, 03/21/22 M. Sherlyn Lees, PT, DPT Physical Therapist- [place] Office Number: [phone]   [place] at [facility] [address] [place], [place] [address] Phone # [phone] Fax # [phone]

## 2022-03-26 ENCOUNTER — Encounter: Payer: Self-pay | Admitting: Physical Therapy

## 2022-03-26 ENCOUNTER — Ambulatory Visit: Payer: Medicare PPO | Admitting: Physical Therapy

## 2022-03-26 DIAGNOSIS — M5459 Other low back pain: Secondary | ICD-10-CM

## 2022-03-26 DIAGNOSIS — R2681 Unsteadiness on feet: Secondary | ICD-10-CM

## 2022-03-26 DIAGNOSIS — R42 Dizziness and giddiness: Secondary | ICD-10-CM

## 2022-03-26 DIAGNOSIS — R293 Abnormal posture: Secondary | ICD-10-CM | POA: Diagnosis not present

## 2022-03-26 NOTE — Therapy (Addendum)
OUTPATIENT PHYSICAL THERAPY VESTIBULAR/NEURO TREATMENT    Patient Name: Dominique Griffin MRN: [id number] DOB:Apr 08, 1951, 71 y.o., female Today's Date: 01/10/2022   PCP: Binnie Rail, MD REFERRING PROVIDER: Amanda Pea, MD        PT End of Session - 03/26/22 1230     Visit Number 15    Number of Visits 19    Date for PT Re-Evaluation 88/11/03   per recert 1/59/4585 visit   Authorization Type [employer]    Authorization Time Period 03/05/2022-04/06/2022    Authorization - Visit Number 4    Authorization - Number of Visits 8    Progress Note Due on Visit 17   PN reported at visit 7   PT Start Time 1234    PT Stop Time 1315    PT Time Calculation (min) 41 min    Equipment Utilized During Treatment Gait belt    Activity Tolerance Patient tolerated treatment well    Behavior During Therapy [facility] for tasks assessed/performed                       No past medical history on file.      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  Do my exercises.  Pain is so much better than it has been.  Back pain is improving but not reliably gone.  PAIN:  Are you having pain? Yes: NPRS scale: 3-4/10 Pain location: L low back Pain description: soreness, knot of pain Aggravating factors: bending at low back Relieving factors: extending low back, Aleve        OBJECTIVE:    TODAY'S TREATMENT: 03/26/2022 Activity Comments  SLS, unsupported, up to 10 sec Cues for tall posture through stance hip  Modified bird-dog at counter, 10 reps, then progressed to bird-dog 5 reps in quadruped at mat   Seated core stabilization with alt UE lifts/LAQ 10 reps, 2-3 sec hold   Prone>POE position   Seated piriformis stretch, 3 x 30 sec   Step up and back with look to target/also with single arm swing Initial unsteadiness, improved with repetition  Single limb stance with step taps to 6" step, standing on solid ground and Airex, intermittent UE support   Single limb step>step behind, 10 reps each leg Intermittent UE support  Supine trunk rotation, then ant/posterior pelvic tilts,  lying on MHP X 3  minutes  Supine gentle distraction, 5 reps each leg, then self-distraction with leg lengthening 5 reps    Pt reports slightly less pain at end of session.   ----------------------------------------------------------------------------- (RE-EVAL completed 03/05/2022 to address pt's c/o low back pain)  Posture Assessment: R shoulder lower than L shoulder Increased trunk lateral flexion at right trunk  Palpation: Tender to palpation at L   Lumbar ROM:  (Assessed in standing) Flexion:  25% limitation; initial pain with flexion-decreased pain at end range Extension:  WFL; no pain and pt reports improved pain in standing extension Right sidebending:  WFL Left sidebending:  25% limitation, mild c/o pain Trunk rotation:  slight limitation with mild c/o pain to L side  Repeated  movements -pt reports decreased pain with standing repeated extension   Prone position: pt reports no c/o pain in prone position Prone on elbows:  increased pain along bilateral low back musculature  Passive extension overpressures with pt in prone improve pain, pt reports relief   No radicular symptoms noted throughout exam  SLR position: negative LLE  Hamstring flexibility in supine with 90/90 start position: L:  -22 passively from full knee extension R -25 passively from full knee extension  MMT: R hip abduction:  4/5 L hip abduction:  4/5 R gluts/hip extension 3+/5 L gluts/hip extension 3+/5  Worked in quadruped for core stability, to find neutral spine and cues for abdominal activation with alt UE lifts x 5 reps, alt leg lifts x 5 reps, opposite arm/leg lifts x 2 reps.  Decreased stability noted at hips with opposite arm/leg lifts.    PATIENT EDUCATION: Education details: Discussed findings assessment of low back pain and addition to POC to address low back pain in addition to progression with vestibular exercises, as pt feels back pain is limiting functional mobility; additions to HEP Person educated: Patient Education method: Explanation and Demonstration Education comprehension: verbalized understanding   Access Code: [email or web address] URL: [email or web address] Date: 03/06/2022-most recent additions Prepared by: [facility]     ______________________________________________ OBJECTIVE Measures from eval, 01/08/2022: COGNITION: Overall cognitive status: Within functional limits for tasks assessed               Cervical ROM:    Active A/PROM (deg) eval  Flexion    Extension    Right lateral flexion    Left lateral flexion    Right rotation WFL  Left rotation WFL  (Blank rows = not tested)   STRENGTH: NT at eval   BED MOBILITY:  independent   TRANSFERS: Assistive device utilized: None  Sit to stand:  Complete Independence Stand to sit: Complete Independence GAIT: Gait pattern:  slowed, guarded, step through pattern, decreased arm swing- Right, and decreased arm swing- Left Distance walked: 60 ft Assistive device utilized: None Level of assistance: Modified independence Comments: Guarded gait pattern   PATIENT SURVEYS:  FOTO Score of 35 at intake/eval; predicted score 51     VESTIBULAR ASSESSMENT              GENERAL OBSERVATION: Moving very slowly                        SYMPTOM BEHAVIOR:                       Subjective history: Has had hx of lightheadedness in the past.  In the past several weeks, any movement makes  me feel lightheaded.  This feels more like when I move my head, my eyes trail, and it makes me nauseous.  Wake up in the morning and it's not bad.  It's more when I'm still.                        Non-Vestibular symptoms: tinnitus and nausea/vomiting (tinnitus is synchronus with heartbeat-this is not new)                       Type of dizziness: Spinning/Vertigo, Unsteady with head/body turns, Lightheadedness/Faint, and "World moves"                       Frequency: multiple episodes/ongoing through the day                       Duration: cumulative throughout the day; lasts through the day                       Aggravating factors: Induced by position change: supine to sit and sit to supine, Induced by motion: turning body quickly, turning head quickly, and sitting in a moving car, Moving eyes, and tends to be worse                        Relieving factors: head stationary, lying supine, medication, slow movements, and sitting still                       Progression of symptoms: worse              OCULOMOTOR EXAM:  Rates symptoms initially as 1/10                       Ocular Alignment: normal                       Ocular ROM: No limitations;  and slight feeling of movement upon return/stop to midline.  Saccades more to R side than L.                       Spontaneous  Nystagmus: absent                       Gaze-Induced Nystagmus: absent                       Smooth Pursuits: saccades more to R side than L                       Saccades: intact                                               VESTIBULAR - OCULAR REFLEX:                        Slow VOR: Normal                       VOR Cancellation: Normal  Head-Impulse Test: HIT Right: negative HIT Left: positive                       Dynamic Visual Acuity: Static: Line 7 Dynamic: Line 6 with 2-3/10 c/o dizziness                        POSITIONAL TESTING: Right Dix-Hallpike: upbeating, right nystagmus Left Dix-Hallpike: no nystagmus Right Roll Test: no nystagmus Left Roll Test: no nystagmus             RDH:  mild symptoms (eyelids twitch) and no c/o until return up to sit Right roll test:  negative, except brief symptoms upon return to sit.    ____________________________________________________________________________ GOALS: Goals reviewed with patient? Yes   SHORT TERM GOALS: = LTGs (per recert/reassessment 0/03/2329)     LONG TERM GOALS: Updated Target date: 04/06/2022   Pt will be independent with progression of HEP for improved dizziness symptoms and decreased low back pain.  Baseline:  Goal status: REVISED   2.  FOTO score to improve to at least 51 for demonstration of improved functional outcome measure. Baseline: 35 at eval>48 02/01/2022 Goal status: PARTIALLY MET, ONGOING   3.  Pt will report no c/o dizziness with functional mobility, turns, gait activities throughout the day. Baseline: not yet walking the dog; approx 60% movements during the day do not bring on dizziness Goal status: PARTIALLY MET/ONGOING, 02/01/2022   4.  FGA score to improve to >27/30 for decreased fall risk.  Baseline: 18/30> 24/30 02/01/2022 Goal status: GOAL REVISED   5.  MCTSIB testing to be assessed and goal to be written as appropriate.  Perform condition 4 full 30 seconds and minimal  sway. Baseline: 26.4 sec, mod sway Goal status: GOAL ONGOING  6.  Pt will improve glut strength bilaterally to at least 4/5 to demonstrate improved stability and posture for decreased pain. Baseline:  4/5 Goal status:  INITIAL  7.  Pt will report pain as less than or equal to 2/10 during the day for improved participation in functional mobility, community exercise, progression to full return to community fitness Baseline:  4/10 Goal status:  INITIAL      ASSESSMENT:   CLINICAL IMPRESSION: Skilled PT session focused on balance activities, single limb stance as well as core stability and lumbar flexibility exercises.  Pt with mild unsteadiness with forward/back step and weightshift with head motions, improved with added arm swing.  Pt continues with some tenderness near L SI joint, and continues to respond well to core stabilization exercises and glut strengthening/activation.  She continues to progress towards goals, especially with balance, as she is not reporting vertigo-type dizziness, rather more balance/unsteadiness.  OBJECTIVE IMPAIRMENTS Abnormal gait, decreased activity tolerance, decreased balance, decreased mobility, difficulty walking, and dizziness, pain, abnormal posture, decreased flexibility, decreased strength.    REHAB POTENTIAL: Good      PLAN: PT FREQUENCY: 2x/week   PT DURATION: 4 weeks, per recert 0/76/2263   PLANNED INTERVENTIONS: Therapeutic exercises, Therapeutic activity, Neuromuscular re-education, Balance training, Gait training, Patient/Family education, Vestibular training, and Canalith repositioning, Manual therapy, joint mobilization, iontophoresis, ultrasound, cryotherapy, moist heat   PLAN FOR NEXT SESSION: Continue core stabilization, hip strengthening, balance exercises towards LTGs.     Mady Haagensen, PT 03/26/22 1:18 PM Phone: [phone] Fax: [phone]  [facility] at [facility] [place], [place], [place] [address] Phone # [phone] Fax # [phone]

## 2022-03-28 ENCOUNTER — Ambulatory Visit: Payer: Medicare PPO | Admitting: Physical Therapy

## 2022-03-28 ENCOUNTER — Encounter: Payer: Self-pay | Admitting: Physical Therapy

## 2022-03-28 DIAGNOSIS — R293 Abnormal posture: Secondary | ICD-10-CM

## 2022-03-28 DIAGNOSIS — M5459 Other low back pain: Secondary | ICD-10-CM

## 2022-03-28 DIAGNOSIS — R42 Dizziness and giddiness: Secondary | ICD-10-CM

## 2022-03-28 DIAGNOSIS — R2681 Unsteadiness on feet: Secondary | ICD-10-CM

## 2022-03-28 NOTE — Therapy (Addendum)
OUTPATIENT PHYSICAL THERAPY VESTIBULAR/NEURO TREATMENT    Patient Name: Dominique Griffin MRN: [id number] DOB:27-Sep-1950, 71 y.o., female Today's Date: 01/10/2022   PCP: Binnie Rail, MD REFERRING PROVIDER: Amanda Pea, MD        PT End of Session - 03/28/22 1407     Visit Number 16    Number of Visits 19    Date for PT Re-Evaluation 94/32/00   per recert 3/79/4446 visit   Authorization Type [employer]    Authorization Time Period 03/05/2022-04/06/2022    Authorization - Visit Number 5    Authorization - Number of Visits 8    Progress Note Due on Visit 17   PN reported at visit 7   PT Start Time 1405    PT Stop Time 1450    PT Time Calculation (min) 45 min    Equipment Utilized During Treatment Gait belt    Activity Tolerance Patient tolerated treatment well    Behavior During Therapy [facility] for tasks assessed/performed                        No past medical history on file.      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   Coccyx pain 09/27/2020   Parkinson's disease ([place]) 12/02/2019   Tremor 05/29/2019   Chronic fatigue 05/29/2019      ONSET DATE: 01/04/2022 MD referral   REFERRING DIAG: Dizziness and Giddiness   THERAPY DIAG:   Unsteadiness on feet  Dizziness and giddiness  Abnormal posture  Other low back pain   Rationale for Evaluation and Treatment Rehabilitation   PERTINENT HISTORY:  Parkinson's disease dx 2020, hx of low back pain; arthritis, chronic fatigue, Gilbert disease, osteopenia   PRECAUTIONS: none    SUBJECTIVE: Vestibular system is a mess. Reports more lightheadedness in the last few days.   PAIN:  Are you having pain? Yes: NPRS scale: 3/10 Pain location: L low back Pain description: soreness, knot of pain Aggravating factors: bending at low back Relieving factors: extending low back, Aleve        OBJECTIVE:    TODAY'S TREATMENT: 03/28/2022 Activity Comments  VOR cancellation Standing  horizontal and vertical Head and target moving together, Symptoms rated 3-4/10  Head and eye moving to 2 targets-horizontal, then vertical Symptoms rated 2-3/10; noted some saccadic eye movements in vertical and horizontal directions; pt rates significant postural sway, but pt appears steady while performing exercises  Discussed progression of HEP away from gaze stabilization, to more functional, head/eye moving exercises Pt verbalizes understanding  Standing on [employer]ire[employer]:  EO x 30 sec EC 4.84 sec, 17.22 sec, >30 sec x 2 reps Better able to hold position with less postural sway with cues for full upright posture  Bird dog modified at counter, 2 x 5 reps with alt extended arms/legs; then performed x 5 reps with flexed knee/hip extended with opposite reaching arm Cues for abdominal activation  Access Code: [email or web address] URL: [email or web address] Date: 03/28/2022 Prepared by: [facility]  Program Notes Do exercises without glasses on; you will stand 2-3 ft from target; with the head movements, keep the target in focus.  You will likely bring on your symptoms to a 4-5/10 level or below.  Rest between sets to bring symptoms down to 0/10.  Exercises - Feet Together Balance with Head Rotation  - 2 x daily - 7 x weekly - 1 sets - 5 reps - Romberg Stance with Head Nods  - 2 x daily - 7 x weekly - 1 sets - 5 reps - Heel Toe Raises with Counter Support  - 1 x daily - 7 x weekly - 2 sets - 10 reps - Standing Lumbar Spine Flexion Stretch Counter  - 1 x daily -  7 x weekly - 2 sets - 10 reps - Alternating Side Step  - 1 x daily - 7 x weekly - 2 sets - 10 reps - Alternating Step Backward with Support  - 1 x daily - 7 x weekly - 3 sets - 10 reps - Alternating Step Forward with Support  - 1 x daily - 7 x weekly - 2 sets - 10 reps - Standing Gastroc Stretch at Counter  - 2-3 x daily - 7 x weekly - 1 sets - 3 reps - 30 sec hold - Standing Bilateral Gastroc Stretch with Step  - 2-3 x daily - 7 x weekly - 1 sets - 3 reps - 15-30 sec hold - Beginner Front Arm Support  - 1 x daily - 5 x weekly - 1-2 sets - 5 reps - Quadruped Alternating Arm Lift  - 1 x daily - 5 x weekly - 1-2 sets - 5 reps - Standing VOR Cancellation  - 1 x daily - 7 x weekly - 1-2 sets - 30 hold - Standing Gaze Stabilization with Two Near Targets and Head Rotation  - 1 x daily - 7 x weekly - 1-2 sets - 30 sec hold ----------------------------------------------------------------------------- (RE-EVAL completed 03/05/2022 to address pt's c/o low back pain)  Posture Assessment: R shoulder lower than L shoulder Increased trunk lateral flexion at right trunk  Palpation: Tender to palpation at L   Lumbar ROM:  (Assessed in standing) Flexion:  25% limitation; initial pain with flexion-decreased pain at end range Extension:  WFL; no pain and pt reports improved pain in standing extension Right sidebending:  WFL Left sidebending:  25% limitation, mild c/o pain Trunk rotation:  slight limitation with mild c/o pain to L side  Repeated movements -pt reports decreased pain with standing repeated extension   Prone position: pt reports no c/o pain in prone position Prone on elbows:  increased pain along bilateral low back musculature  Passive extension overpressures with pt in prone improve pain, pt reports relief   No radicular symptoms noted throughout exam  SLR position: negative LLE  Hamstring flexibility in supine with 90/90 start position: L:  -22 passively from full knee extension R  -25 passively from full knee extension  MMT: R hip abduction:  4/5 L hip abduction:  4/5 R gluts/hip extension 3+/5 L gluts/hip extension 3+/5  Worked in quadruped for core stability, to find neutral spine and cues for abdominal activation with alt UE lifts x 5 reps, alt leg lifts x 5 reps, opposite arm/leg lifts x 2 reps.  Decreased stability noted at hips with opposite arm/leg lifts.    PATIENT EDUCATION:  Education details: Discussed findings assessment of low back pain and addition to POC to address low back pain in addition to progression with vestibular exercises, as pt feels back pain is limiting functional mobility; additions to HEP Person educated: Patient Education method: Explanation and Demonstration Education comprehension: verbalized understanding   Access Code: [email or web address] URL: [email or web address] Date: 03/06/2022-most recent additions Prepared by: [facility]     ______________________________________________ OBJECTIVE Measures from eval, 01/08/2022: COGNITION: Overall cognitive status: Within functional limits for tasks assessed               Cervical ROM:    Active A/PROM (deg) eval  Flexion    Extension    Right lateral flexion    Left lateral flexion    Right rotation WFL  Left rotation WFL  (Blank rows = not tested)   STRENGTH: NT at eval   BED MOBILITY:  independent   TRANSFERS: Assistive device utilized: None  Sit to stand: Complete Independence Stand to sit: Complete Independence GAIT: Gait pattern:  slowed, guarded, step through pattern, decreased arm swing- Right, and decreased arm swing- Left Distance walked: 60 ft Assistive device utilized: None Level of assistance: Modified independence Comments: Guarded gait pattern   PATIENT SURVEYS:  FOTO Score of 35 at intake/eval; predicted score 51     VESTIBULAR ASSESSMENT              GENERAL OBSERVATION: Moving very slowly                         SYMPTOM BEHAVIOR:                       Subjective history: Has had hx of lightheadedness in the past.  In the past several weeks, any movement makes me feel lightheaded.  This feels more like when I move my head, my eyes trail, and it makes me nauseous.  Wake up in the morning and it's not bad.  It's more when I'm still.                        Non-Vestibular symptoms: tinnitus and nausea/vomiting (tinnitus is synchronus with heartbeat-this is not new)                       Type of dizziness: Spinning/Vertigo, Unsteady with head/body turns, Lightheadedness/Faint, and "World moves"                       Frequency: multiple episodes/ongoing through the day                       Duration: cumulative throughout the day; lasts through the day                       Aggravating factors: Induced by position change: supine to sit and sit to supine, Induced by motion: turning body quickly, turning head quickly, and sitting in a moving car, Moving eyes, and tends to be worse                        Relieving factors: head stationary, lying supine, medication, slow movements, and sitting still                       Progression of  symptoms: worse              OCULOMOTOR EXAM:  Rates symptoms initially as 1/10                       Ocular Alignment: normal                       Ocular ROM: No limitations;  and slight feeling of movement upon return/stop to midline.  Saccades more to R side than L.                       Spontaneous Nystagmus: absent                       Gaze-Induced Nystagmus: absent                       Smooth Pursuits: saccades more to R side than L                       Saccades: intact                                               VESTIBULAR - OCULAR REFLEX:                        Slow VOR: Normal                       VOR Cancellation: Normal                       Head-Impulse Test: HIT Right: negative HIT Left: positive                       Dynamic Visual Acuity:  Static: Line 7 Dynamic: Line 6 with 2-3/10 c/o dizziness                        POSITIONAL TESTING: Right Dix-Hallpike: upbeating, right nystagmus Left Dix-Hallpike: no nystagmus Right Roll Test: no nystagmus Left Roll Test: no nystagmus             RDH:  mild symptoms (eyelids twitch) and no c/o until return up to sit Right roll test:  negative, except brief symptoms upon return to sit.    ____________________________________________________________________________ GOALS: Goals reviewed with patient? Yes   SHORT TERM GOALS: = LTGs (per recert/reassessment 5/88/5027)     LONG TERM GOALS: Updated Target date: 04/06/2022   Pt will be independent with progression of HEP for improved dizziness symptoms and decreased low back pain.  Baseline:  Goal status: REVISED   2.  FOTO score to improve to at least 51 for demonstration of improved functional outcome measure. Baseline: 35 at eval>48 02/01/2022 Goal status: PARTIALLY MET, ONGOING   3.  Pt will report no c/o dizziness with functional mobility, turns, gait activities throughout the day. Baseline: not yet walking the dog; approx 60% movements during the day do not bring on dizziness Goal status: PARTIALLY MET/ONGOING, 02/01/2022   4.  FGA score to improve to >27/30 for decreased fall risk.  Baseline: 18/30> 24/30 02/01/2022 Goal status: GOAL REVISED   5.  MCTSIB  testing to be assessed and goal to be written as appropriate.  Perform condition 4 full 30 seconds and minimal sway. Baseline: 26.4 sec, mod sway; 30 sec  03/28/2022 Goal status: GOAL MET  6.  Pt will improve glut strength bilaterally to at least 4/5 to demonstrate improved stability and posture for decreased pain. Baseline:  4/5 Goal status:  INITIAL  7.  Pt will report pain as less than or equal to 2/10 during the day for improved participation in functional mobility, community exercise, progression to full return to community fitness Baseline:  4/10 Goal status:   INITIAL      ASSESSMENT:   CLINICAL IMPRESSION: Began looking at [place] today, with pt meeting LTG 5 for improved condition 4 on MCTSIB.  She does improve ability for holding this position with cues for upright posture, engaging gluts and extensors for more postural stability.  Progressed pt's HEP for more head and eye motion exercises, with pt noted to have some saccade/lag with eye movements to targets, c/o 3-4/10 dizziness.  Overall, she is progressing well since eval in regards to both vestibular and back pain symptoms.  She does continue to have some lightheadedness and reports of increased postural sway, again, improving with cues for activation of extensor musculature.  OBJECTIVE IMPAIRMENTS Abnormal gait, decreased activity tolerance, decreased balance, decreased mobility, difficulty walking, and dizziness, pain, abnormal posture, decreased flexibility, decreased strength.    REHAB POTENTIAL: Good      PLAN: PT FREQUENCY: 2x/week   PT DURATION: 4 weeks, per recert 7/74/1423   PLANNED INTERVENTIONS: Therapeutic exercises, Therapeutic activity, Neuromuscular re-education, Balance training, Gait training, Patient/Family education, Vestibular training, and Canalith repositioning, Manual therapy, joint mobilization, iontophoresis, ultrasound, cryotherapy, moist heat   PLAN FOR NEXT SESSION: Begin assessing remaining LTGs, with plans to d/c PT next week.  Need to complete FOTO and discuss return PT screen.  Continue core stabilization, hip strengthening, balance exercises towards LTGs.     Mady Haagensen, PT 03/28/22 3:17 PM Phone: [phone] Fax: [phone]  [facility] at [facility] [place], [address] [place], [place] [address] Phone # [phone] Fax # [phone]

## 2022-04-02 ENCOUNTER — Ambulatory Visit: Payer: Medicare PPO | Admitting: Physical Therapy

## 2022-04-02 ENCOUNTER — Encounter: Payer: Self-pay | Admitting: Physical Therapy

## 2022-04-02 DIAGNOSIS — R293 Abnormal posture: Secondary | ICD-10-CM | POA: Diagnosis not present

## 2022-04-02 DIAGNOSIS — R42 Dizziness and giddiness: Secondary | ICD-10-CM | POA: Diagnosis not present

## 2022-04-02 DIAGNOSIS — R2681 Unsteadiness on feet: Secondary | ICD-10-CM | POA: Diagnosis not present

## 2022-04-02 DIAGNOSIS — M5459 Other low back pain: Secondary | ICD-10-CM | POA: Diagnosis not present

## 2022-04-02 NOTE — Therapy (Incomplete)
OUTPATIENT PHYSICAL THERAPY VESTIBULAR/NEURO TREATMENT/PROGRESS NOTE   Patient Name: Dominique Griffin MRN: [id number] DOB:Apr 25, 1951, 71 y.o., female Today's Date: 01/10/2022   PCP: Binnie Rail, MD REFERRING PROVIDER: Amanda Pea, MD        PT End of Session - 04/02/22 1534     Visit Number 17    Number of Visits 19    Date for PT Re-Evaluation 14/10/30   per recert 08/08/4386 visit   Authorization Type [employer]    Authorization Time Period 03/05/2022-04/06/2022    Authorization - Visit Number 6    Authorization - Number of Visits 8    Progress Note Due on Visit 17   PN reported at visit 7   PT Start Time 1534    PT Stop Time 1615    PT Time Calculation (min) 41 min    Equipment Utilized During Treatment Gait belt    Activity Tolerance Patient tolerated treatment well    Behavior During Therapy [facility] for tasks assessed/performed            Progress Note Reporting Period 02/01/2022 to 04/02/2022  See note below for Objective Data and Assessment of Progress/Goals.                 No past medical history on file.      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   Coccyx pain 09/27/2020   Parkinson's disease ([place]) 12/02/2019   Tremor 05/29/2019   Chronic fatigue 05/29/2019      ONSET DATE: 01/04/2022 MD referral   REFERRING DIAG: Dizziness and Giddiness   THERAPY DIAG:   Unsteadiness on feet  Dizziness and giddiness  Abnormal posture   Rationale for Evaluation and Treatment Rehabilitation   PERTINENT HISTORY:  Parkinson's  disease dx 2020, hx of low back pain; arthritis, chronic fatigue, Gilbert disease, osteopenia   PRECAUTIONS: none   SUBJECTIVE: The new exercises are working pretty well.  Sometimes bring on the lightheadedness.  Back overall feeling well.  Maybe the spinal rotation (with Boxing) brings back pain on more.  PAIN:  Are you having pain? Yes: NPRS scale: 3/10 Pain location: L low back Pain description: soreness, knot of pain Aggravating factors: bending at low back Relieving factors: extending low back, Aleve        OBJECTIVE:    TODAY'S TREATMENT: 04/02/2022 Activity Comments  Reviewed HEP additions last visit:   VOR cancellation Standing  horizontal and vertical Head and eye moving to 2 targets-horizontal, then vertical Cues for correct technique with VOR cancellation, then pt able to return demo understanding  FGA score:  24/30 See below  Minisquat activity, simulating gardening Good form with wide BOS and with   Standing with UE Diagonals; V-movment UE pattern holding letter patterned ball   FOTO:  score 52  Improved from 34 (eval)> 47.8 (visit 7)  Standing on foam EO, EC with feet apart, feet together Increased postural sway with EC; improved stability  with fingertip UE support       [facility] PT Assessment - 04/02/22 1535       Functional Gait  Assessment   Gait assessed  Yes    Gait Level Surface Walks 20 ft in less than 7 sec but greater than 5.5 sec, uses assistive device, slower speed, mild gait deviations, or deviates 6-10 in outside of the 12 in walkway width.    Change in Gait Speed Able to change speed, demonstrates mild gait deviations, deviates 6-10 in outside of the 12 in walkway width, or no gait deviations, unable to achieve a major change in velocity, or uses a change in velocity, or uses an assistive device.    Gait with Horizontal Head Turns Performs head turns smoothly with no change in gait. Deviates no more than 6 in outside 12 in walkway width    Gait with  Vertical Head Turns Performs head turns with no change in gait. Deviates no more than 6 in outside 12 in walkway width.    Gait and Pivot Turn Pivot turns safely within 3 sec and stops quickly with no loss of balance.    Step Over Obstacle Is able to step over 2 stacked shoe boxes taped together (9 in total height) without changing gait speed. No evidence of imbalance.    Gait with Narrow Base of Support Is able to ambulate for 10 steps heel to toe with no staggering.    Gait with Eyes Closed Walks 20 ft, slow speed, abnormal gait pattern, evidence for imbalance, deviates 10-15 in outside 12 in walkway width. Requires more than 9 sec to ambulate 20 ft.   11.91   Ambulating Backwards Walks 20 ft, uses assistive device, slower speed, mild gait deviations, deviates 6-10 in outside 12 in walkway width.   21.07   Steps Alternating feet, must use rail.    Total Score 24              PATIENT EDUCATION: Education details: Progress towards goals, POC and plans for d/c next week Person educated: Patient Education method: Explanation Education comprehension: verbalized understanding   Access Code: [email or web address] URL: [email or web address] Date: 03/28/2022 Prepared by: [facility]  Program Notes Do exercises without glasses on; you will stand 2-3 ft from target; with the head movements, keep the target in focus.  You will likely bring on your symptoms to a 4-5/10 level or below.  Rest between sets to bring symptoms down to 0/10.  Exercises - Feet Together Balance with Head Rotation  - 2 x daily - 7 x weekly - 1 sets - 5 reps - Romberg Stance with Head Nods  - 2 x daily - 7 x weekly - 1 sets - 5 reps - Heel Toe Raises with Counter Support  - 1 x daily - 7 x weekly - 2 sets - 10 reps - Standing Lumbar Spine Flexion Stretch Counter  - 1 x daily - 7 x weekly - 2 sets - 10 reps - Alternating Side Step  - 1 x daily - 7 x weekly - 2 sets - 10 reps -  Alternating Step Backward with Support  - 1 x daily - 7 x weekly - 3 sets - 10 reps - Alternating Step Forward with Support  - 1 x daily - 7 x weekly - 2 sets - 10 reps - Standing Gastroc Stretch at Counter  - 2-3 x daily - 7 x weekly - 1 sets - 3  reps - 30 sec hold - Standing Bilateral Gastroc Stretch with Step  - 2-3 x daily - 7 x weekly - 1 sets - 3 reps - 15-30 sec hold - Beginner Front Arm Support  - 1 x daily - 5 x weekly - 1-2 sets - 5 reps - Quadruped Alternating Arm Lift  - 1 x daily - 5 x weekly - 1-2 sets - 5 reps - Standing VOR Cancellation  - 1 x daily - 7 x weekly - 1-2 sets - 30 hold - Standing Gaze Stabilization with Two Near Targets and Head Rotation  - 1 x daily - 7 x weekly - 1-2 sets - 30 sec hold ----------------------------------------------------------------------------- (RE-EVAL completed 03/05/2022 to address pt's c/o low back pain)  Posture Assessment: R shoulder lower than L shoulder Increased trunk lateral flexion at right trunk  Palpation: Tender to palpation at L   Lumbar ROM:  (Assessed in standing) Flexion:  25% limitation; initial pain with flexion-decreased pain at end range Extension:  WFL; no pain and pt reports improved pain in standing extension Right sidebending:  WFL Left sidebending:  25% limitation, mild c/o pain Trunk rotation:  slight limitation with mild c/o pain to L side  Repeated movements -pt reports decreased pain with standing repeated extension   Prone position: pt reports no c/o pain in prone position Prone on elbows:  increased pain along bilateral low back musculature  Passive extension overpressures with pt in prone improve pain, pt reports relief   No radicular symptoms noted throughout exam  SLR position: negative LLE  Hamstring flexibility in supine with 90/90 start position: L:  -22 passively from full knee extension R -25 passively from full knee extension  MMT: R hip abduction:  4/5 L hip abduction:  4/5 R  gluts/hip extension 3+/5 L gluts/hip extension 3+/5  Worked in quadruped for core stability, to find neutral spine and cues for abdominal activation with alt UE lifts x 5 reps, alt leg lifts x 5 reps, opposite arm/leg lifts x 2 reps.  Decreased stability noted at hips with opposite arm/leg lifts.    PATIENT EDUCATION: Education details: Discussed findings assessment of low back pain and addition to POC to address low back pain in addition to progression with vestibular exercises, as pt feels back pain is limiting functional mobility; additions to HEP Person educated: Patient Education method: Explanation and Demonstration Education comprehension: verbalized understanding   Access Code: [email or web address] URL: [email or web address] Date: 03/06/2022-most recent additions Prepared by: [facility]     ______________________________________________ OBJECTIVE Measures from eval, 01/08/2022: COGNITION: Overall cognitive status: Within functional limits for tasks assessed               Cervical ROM:    Active A/PROM (deg) eval  Flexion    Extension    Right lateral flexion    Left lateral flexion    Right rotation WFL  Left rotation WFL  (Blank rows = not tested)   STRENGTH: NT at eval   BED MOBILITY:  independent   TRANSFERS: Assistive device utilized: None  Sit to stand: Complete Independence Stand to sit: Complete Independence GAIT: Gait pattern:  slowed, guarded, step through pattern, decreased arm swing- Right, and decreased arm swing- Left Distance walked: 60 ft Assistive device utilized: None Level of assistance: Modified independence Comments: Guarded gait pattern   PATIENT SURVEYS:  FOTO Score of 35 at intake/eval; predicted score 51     VESTIBULAR ASSESSMENT  GENERAL OBSERVATION: Moving very slowly                        SYMPTOM BEHAVIOR:                       Subjective history: Has had hx of  lightheadedness in the past.  In the past several weeks, any movement makes me feel lightheaded.  This feels more like when I move my head, my eyes trail, and it makes me nauseous.  Wake up in the morning and it's not bad.  It's more when I'm still.                        Non-Vestibular symptoms: tinnitus and nausea/vomiting (tinnitus is synchronus with heartbeat-this is not new)                       Type of dizziness: Spinning/Vertigo, Unsteady with head/body turns, Lightheadedness/Faint, and "World moves"                       Frequency: multiple episodes/ongoing through the day                       Duration: cumulative throughout the day; lasts through the day                       Aggravating factors: Induced by position change: supine to sit and sit to supine, Induced by motion: turning body quickly, turning head quickly, and sitting in a moving car, Moving eyes, and tends to be worse                        Relieving factors: head stationary, lying supine, medication, slow movements, and sitting still                       Progression of symptoms: worse              OCULOMOTOR EXAM:  Rates symptoms initially as 1/10                       Ocular Alignment: normal                       Ocular ROM: No limitations;  and slight feeling of movement upon return/stop to midline.  Saccades more to R side than L.                       Spontaneous Nystagmus: absent                       Gaze-Induced Nystagmus: absent                       Smooth Pursuits: saccades more to R side than L                       Saccades: intact                                               VESTIBULAR - OCULAR  REFLEX:                        Slow VOR: Normal                       VOR Cancellation: Normal                       Head-Impulse Test: HIT Right: negative HIT Left: positive                       Dynamic Visual Acuity: Static: Line 7 Dynamic: Line 6 with 2-3/10 c/o dizziness                        POSITIONAL  TESTING: Right Dix-Hallpike: upbeating, right nystagmus Left Dix-Hallpike: no nystagmus Right Roll Test: no nystagmus Left Roll Test: no nystagmus             RDH:  mild symptoms (eyelids twitch) and no c/o until return up to sit Right roll test:  negative, except brief symptoms upon return to sit.    ____________________________________________________________________________ GOALS: Goals reviewed with patient? Yes   SHORT TERM GOALS: = LTGs (per recert/reassessment 1/32/4401)     LONG TERM GOALS: Updated Target date: 04/06/2022   Pt will be independent with progression of HEP for improved dizziness symptoms and decreased low back pain.  Baseline:  Goal status: GOAL MET   2.  FOTO score to improve to at least 51 for demonstration of improved functional outcome measure. Baseline: 35 at eval>48 02/01/2022>52 04/02/2022 Goal status: GOAL MET   3.  Pt will report no c/o dizziness with functional mobility, turns, gait activities throughout the day. Baseline: not yet walking the dog; approx 60% movements during the day do not bring on dizziness; 04/02/2022:  reports lightheadedness anytime upon standing; less frequency and total duration Goal status: GOAL NOT met 04/02/2022   4.  FGA score to improve to >27/30 for decreased fall risk.  Baseline: 18/30> 24/30 02/01/2022; 04/02/2022 04/02/2022 Goal status: GOAL NOT MET   5.  MCTSIB testing to be assessed and goal to be written as appropriate.  Perform condition 4 full 30 seconds and minimal sway. Baseline: 26.4 sec, mod sway; 30 sec  03/28/2022 Goal status: GOAL MET  6.  Pt will improve glut strength bilaterally to at least 4/5 to demonstrate improved stability and posture for decreased pain. Baseline:  4/5 Goal status:  INITIAL  7.  Pt will report pain as less than or equal to 2/10 during the day for improved participation in functional mobility, community exercise, progression to full return to community fitness Baseline:  4/10 Goal  status:  INITIAL      ASSESSMENT:   CLINICAL IMPRESSION: PROGRESS NOTE completed today:  Pt reports improved functional mobility since beginning physical therapy.  She does report continued lightheadedness, occurring with standing and with gait.  She also has increased postural sway, especially with EC.  Assessed additional LTGs, with pt meeting LTG 1 and 2.  LTG 3 not met and LTG 4 not met.  Pt is now walking her dog and has returned to [employer].  Discussed ongoing progress with activity level and vestibular exercises; also to continue to work on core stability exercises for low back (to be fully addressed next visit).  Plan to discharge next visit, as pt has exercises to continue to address vestibular symptoms and  back pain.    OBJECTIVE IMPAIRMENTS Abnormal gait, decreased activity tolerance, decreased balance, decreased mobility, difficulty walking, and dizziness, pain, abnormal posture, decreased flexibility, decreased strength.    REHAB POTENTIAL: Good      PLAN: PT FREQUENCY: 2x/week   PT DURATION: 4 weeks, per recert 7/42/5525   PLANNED INTERVENTIONS: Therapeutic exercises, Therapeutic activity, Neuromuscular re-education, Balance training, Gait training, Patient/Family education, Vestibular training, and Canalith repositioning, Manual therapy, joint mobilization, iontophoresis, ultrasound, cryotherapy, moist heat   PLAN FOR NEXT SESSION: Check remaining LTGs (associated with back pain) and make any updates to HEP for back as needed.  Also provide pictures for EC on compliant surface (feet apart/together), D/C next visit.  Have pt schedule return PT screen for 6 months from now.   Mady Haagensen, PT 04/03/22 3:44 PM Phone: [phone] Fax: [phone]  [facility] at [facility] [place], [address], [place] [address] Phone # [phone] Fax # [phone]

## 2022-04-04 ENCOUNTER — Ambulatory Visit: Payer: Medicare PPO

## 2022-04-04 DIAGNOSIS — R42 Dizziness and giddiness: Secondary | ICD-10-CM | POA: Diagnosis not present

## 2022-04-04 DIAGNOSIS — R293 Abnormal posture: Secondary | ICD-10-CM | POA: Diagnosis not present

## 2022-04-04 DIAGNOSIS — M5459 Other low back pain: Secondary | ICD-10-CM | POA: Diagnosis not present

## 2022-04-04 DIAGNOSIS — R2681 Unsteadiness on feet: Secondary | ICD-10-CM

## 2022-04-04 NOTE — Therapy (Signed)
OUTPATIENT PHYSICAL THERAPY VESTIBULAR/NEURO TREATMENT /Discharge Patient Name: Dominique Griffin MRN: [id number] DOB:Aug 03, 1950, 71 y.o., female Today's Date: 01/10/2022   PCP: Binnie Rail, MD REFERRING PROVIDER: Amanda Pea, MD        PT End of Session - 04/04/22 1416     Visit Number 18    Number of Visits 19    Date for PT Re-Evaluation 28/78/67   per recert 6/72/0947 visit   Authorization Type [employer]    Authorization Time Period 03/05/2022-04/06/2022    Authorization - Visit Number 7    Authorization - Number of Visits 8    Progress Note Due on Visit 17   PN reported at visit 7   PT Start Time 1415   late arrival   PT Stop Time 1450    PT Time Calculation (min) 35 min    Equipment Utilized During Treatment Gait belt    Activity Tolerance Patient tolerated treatment well    Behavior During Therapy [facility] for tasks assessed/performed                 No past medical history on file.      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 Yes: NPRS scale: 3/10 Pain location: L low back Pain description: soreness, knot of pain Aggravating factors: bending at low back Relieving factors: extending low back, Aleve        OBJECTIVE:   04/04/22 Treatment for HEP review of LBP tx and vestibular techniques.  Slight +Head Impulse Test left ear Pt education regarding techniques for modified quadruped (forearms on counter)      PATIENT EDUCATION: Education details: Progress towards goals, POC and plans for d/c next week Person educated: Patient Education method: Explanation Education comprehension: verbalized understanding   Access Code: [email or web address] URL: [email or web address] Date: 03/28/2022 Prepared by: [facility]  Program Notes Do exercises without glasses on; you will stand 2-3 ft from target; with the head movements, keep the target in focus.  You will likely bring on your symptoms to a 4-5/10 level or below.  Rest between sets to bring symptoms down to 0/10.  Exercises - Feet Together Balance with Head Rotation  - 2 x daily - 7 x weekly - 1 sets - 5 reps - Romberg Stance with Head Nods  - 2  x daily - 7 x weekly - 1 sets - 5 reps - Heel Toe Raises with Counter Support  - 1 x daily - 7 x weekly - 2 sets - 10 reps - Standing Lumbar Spine Flexion Stretch Counter  - 1 x daily - 7 x weekly - 2 sets - 10 reps - Alternating Side Step  - 1 x daily - 7 x weekly - 2 sets - 10 reps - Alternating Step Backward with Support  - 1 x daily - 7 x weekly - 3 sets - 10 reps - Alternating Step Forward with Support  - 1 x daily - 7 x weekly - 2 sets - 10 reps - Standing Gastroc Stretch at Counter  - 2-3 x daily - 7 x weekly - 1 sets - 3 reps - 30 sec hold - Standing Bilateral Gastroc Stretch with Step  - 2-3 x daily - 7 x weekly - 1 sets - 3 reps - 15-30 sec hold - Beginner Front Arm Support  -  1 x daily - 5 x weekly - 1-2 sets - 5 reps - Quadruped Alternating Arm Lift  - 1 x daily - 5 x weekly - 1-2 sets - 5 reps - Standing VOR Cancellation  - 1 x daily - 7 x weekly - 1-2 sets - 30 hold - Standing Gaze Stabilization with Two Near Targets and Head Rotation  - 1 x daily - 7 x weekly - 1-2 sets - 30 sec hold ----------------------------------------------------------------------------- (RE-EVAL completed 03/05/2022 to address pt's c/o low back pain)  Posture Assessment: R shoulder lower than L shoulder Increased trunk lateral flexion at right trunk  Palpation: Tender to palpation at L   Lumbar ROM:  (Assessed in standing) Flexion:  25% limitation; initial pain with flexion-decreased pain at end range Extension:  WFL; no pain and pt reports improved pain in standing extension Right sidebending:  WFL Left sidebending:  25% limitation, mild c/o pain Trunk rotation:  slight limitation with mild c/o pain to L side  Repeated movements -pt reports decreased pain with standing repeated extension   Prone position: pt reports no c/o pain in prone position Prone on elbows:  increased pain along bilateral low back musculature  Passive extension overpressures with pt in prone improve pain, pt reports relief   No radicular symptoms noted throughout exam  SLR position: negative LLE  Hamstring flexibility in supine with 90/90 start position: L:  -22 passively from full knee extension R -25 passively from full knee extension  MMT: R hip abduction:  4/5 L hip abduction:  4/5 R gluts/hip extension 3+/5 L gluts/hip extension 3+/5  Worked in quadruped for core stability, to find neutral spine and cues for abdominal activation with alt UE lifts x 5 reps, alt leg lifts x 5 reps, opposite arm/leg lifts x 2 reps.  Decreased stability noted at hips with opposite arm/leg lifts.    PATIENT EDUCATION: Education details: Discussed findings assessment of low back pain and addition to POC to  address low back pain in addition to progression with vestibular exercises, as pt feels back pain is limiting functional mobility; additions to HEP Person educated: Patient Education method: Explanation and Demonstration Education comprehension: verbalized understanding   Access Code: [email or web address] URL: [email or web address] Date: 03/06/2022-most recent additions Prepared by: [facility]     ______________________________________________ OBJECTIVE Measures from eval, 01/08/2022: COGNITION: Overall cognitive status: Within functional limits for tasks assessed  Cervical ROM:    Active A/PROM (deg) eval  Flexion    Extension    Right lateral flexion    Left lateral flexion    Right rotation WFL  Left rotation WFL  (Blank rows = not tested)   STRENGTH: NT at eval   BED MOBILITY:  independent   TRANSFERS: Assistive device utilized: None  Sit to stand: Complete Independence Stand to sit: Complete Independence GAIT: Gait pattern:  slowed, guarded, step through pattern, decreased arm swing- Right, and decreased arm swing- Left Distance walked: 60 ft Assistive device utilized: None Level of assistance: Modified independence Comments: Guarded gait pattern   PATIENT SURVEYS:  FOTO Score of 35 at intake/eval; predicted score 51     VESTIBULAR ASSESSMENT              GENERAL OBSERVATION: Moving very slowly                        SYMPTOM BEHAVIOR:                       Subjective history: Has had hx of lightheadedness in the past.  In the past several weeks, any movement makes me feel lightheaded.  This feels more like when I move my head, my eyes trail, and it makes me nauseous.  Wake up in the morning and it's not bad.  It's more when I'm still.                        Non-Vestibular symptoms: tinnitus and nausea/vomiting (tinnitus is synchronus with heartbeat-this is not new)                       Type of  dizziness: Spinning/Vertigo, Unsteady with head/body turns, Lightheadedness/Faint, and "World moves"                       Frequency: multiple episodes/ongoing through the day                       Duration: cumulative throughout the day; lasts through the day                       Aggravating factors: Induced by position change: supine to sit and sit to supine, Induced by motion: turning body quickly, turning head quickly, and sitting in a moving car, Moving eyes, and tends to be worse                        Relieving factors: head stationary, lying supine, medication, slow movements, and sitting still                       Progression of symptoms: worse              OCULOMOTOR EXAM:  Rates symptoms initially as 1/10                       Ocular Alignment: normal                       Ocular ROM: No limitations;  and slight feeling of movement upon return/stop to midline.  Saccades more to R side than L.  Spontaneous Nystagmus: absent                       Gaze-Induced Nystagmus: absent                       Smooth Pursuits: saccades more to R side than L                       Saccades: intact                                               VESTIBULAR - OCULAR REFLEX:                        Slow VOR: Normal                       VOR Cancellation: Normal                       Head-Impulse Test: HIT Right: negative HIT Left: positive                       Dynamic Visual Acuity: Static: Line 7 Dynamic: Line 6 with 2-3/10 c/o dizziness                        POSITIONAL TESTING: Right Dix-Hallpike: upbeating, right nystagmus Left Dix-Hallpike: no nystagmus Right Roll Test: no nystagmus Left Roll Test: no nystagmus             RDH:  mild symptoms (eyelids twitch) and no c/o until return up to sit Right roll test:  negative, except brief symptoms upon return to sit.    ____________________________________________________________________________ GOALS: Goals reviewed  with patient? Yes   SHORT TERM GOALS: = LTGs (per recert/reassessment 2/69/4854)     LONG TERM GOALS: Updated Target date: 04/06/2022   Pt will be independent with progression of HEP for improved dizziness symptoms and decreased low back pain.  Baseline:  Goal status: GOAL MET   2.  FOTO score to improve to at least 51 for demonstration of improved functional outcome measure. Baseline: 35 at eval>48 02/01/2022>52 04/02/2022 Goal status: GOAL MET   3.  Pt will report no c/o dizziness with functional mobility, turns, gait activities throughout the day. Baseline: not yet walking the dog; approx 60% movements during the day do not bring on dizziness; 04/02/2022:  reports lightheadedness anytime upon standing; less frequency and total duration Goal status: GOAL NOT met 04/02/2022   4.  FGA score to improve to >27/30 for decreased fall risk.  Baseline: 18/30> 24/30 02/01/2022; 04/02/2022 04/02/2022 Goal status: GOAL NOT MET   5.  MCTSIB testing to be assessed and goal to be written as appropriate.  Perform condition 4 full 30 seconds and minimal sway. Baseline: 26.4 sec, mod sway; 30 sec  03/28/2022 Goal status: GOAL MET  6.  Pt will improve glut strength bilaterally to at least 4/5 to demonstrate improved stability and posture for decreased pain. Baseline:  4/5; 3+/5 RLE Goal status:  NOT MET  7.  Pt will report pain as less than or equal to 2/10 during the day for improved participation in functional mobility, community exercise, progression to full return to community  fitness Baseline:  4/10; 3/10 generalized Goal status:  NOT MET      ASSESSMENT:   CLINICAL IMPRESSION: Demonstrates independence and proficiency with HEP activities for vestibular, orthopedic, and neurologic considerations and demonstrates highest practical level at this time and will D/C to HEP and community-based programs for continued maintenance.   OBJECTIVE IMPAIRMENTS Abnormal gait, decreased activity tolerance,  decreased balance, decreased mobility, difficulty walking, and dizziness, pain, abnormal posture, decreased flexibility, decreased strength.    REHAB POTENTIAL: Good      PLAN: PT FREQUENCY: 2x/week   PT DURATION: 4 weeks, per recert 6/44/0347   PLANNED INTERVENTIONS: Therapeutic exercises, Therapeutic activity, Neuromuscular re-education, Balance training, Gait training, Patient/Family education, Vestibular training, and Canalith repositioning, Manual therapy, joint mobilization, iontophoresis, ultrasound, cryotherapy, moist heat   PLAN FOR NEXT SESSION: D/C to HEP   2:20 PM, 04/04/22 M. Sherlyn Lees, PT, DPT Physical Therapist- [place] Office Number: [phone]   [place] [facility] [address] [place], [place] [address] Phone # [phone] Fax # [phone]

## 2022-04-10 NOTE — Progress Notes (Unsigned)
Subjective:   Dominique Griffin is a 71 y.o. female who presents for an Initial Medicare Annual Wellness Visit. I connected with  Dominique Griffin on 04/10/22 by a audio enabled telemedicine application and verified that I am speaking with the correct person using two identifiers.  Patient Location: Home  Provider Location: Home Office  I discussed the limitations of evaluation and management by telemedicine. The patient expressed understanding and agreed to proceed.  Review of Systems    Deferred to PCP       Objective:    There were no vitals filed for this visit. There is no height or weight on file to calculate BMI.     01/08/2022    8:08 AM  Advanced Directives  Does Patient Have a Medical Advance Directive? Yes  Type of Estate agent of [place];Living will  Does patient want to make changes to medical advance directive? No - Patient declined    Current Medications (verified) Outpatient Encounter Medications as of 04/11/2022  Medication Sig   calcium carbonate (TUMS - DOSED IN MG ELEMENTAL CALCIUM) 500 MG chewable tablet Chew 1 tablet by mouth daily.   Cholecalciferol 50 MCG (2000 UT) TABS Take by mouth.   ibuprofen (ADVIL) 200 MG tablet Take by mouth.   meclizine (ANTIVERT) 12.5 MG tablet Take 12.5 mg by mouth 3 (three) times daily as needed for dizziness.   Multiple Vitamin (MULTIVITAMIN ADULT PO) Take by mouth.   pseudoephedrine (SUDAFED) 120 MG 12 hr tablet Take 120 mg by mouth 2 (two) times daily.   No facility-administered encounter medications on file as of 04/11/2022.    Allergies (verified) Patient has no known allergies.   History: No past medical history on file. Past Surgical History:  Procedure Laterality Date   ABDOMINAL HYSTERECTOMY     partial - still has ovaries   BREAST EXCISIONAL BIOPSY Left    skin bbiopsy   Family History  Problem Relation Age of Onset   Breast cancer Mother    Stroke Mother    Breast cancer  Maternal Aunt    Social History   Socioeconomic History   Marital status: Married    Spouse name: Not on file   Number of children: 0   Years of education: Not on file   Highest education level: Not on file  Occupational History   Not on file  Tobacco Use   Smoking status: Former   Smokeless tobacco: Never   Tobacco comments:    smoked minimally  Substance and Sexual Activity   Alcohol use: Not on file   Drug use: Not on file   Sexual activity: Not on file  Other Topics Concern   Not on file  Social History Narrative   Retired Building surveyor professor - Systems developer   Social Determinants of Corporate investment banker Strain: Not on file  Food Insecurity: Not on file  Transportation Needs: Not on file  Physical Activity: Not on file  Stress: Not on file  Social Connections: Not on file    Tobacco Counseling Counseling given: Not Answered Tobacco comments: smoked minimally   Clinical Intake:              How often do you need to have someone help you when you read instructions, pamphlets, or other written materials from your doctor or pharmacy?: (P) 1 - Never  Diabetic?No         Activities of Daily Living    04/07/2022   10:17 AM  In your present state of health, do you have any difficulty performing the following activities:  Hearing? 0  Vision? 0  Difficulty concentrating or making decisions? 0  Walking or climbing stairs? 0  Dressing or bathing? 0  Doing errands, shopping? 0  Preparing Food and eating ? N  Using the Toilet? N  In the past six months, have you accidently leaked urine? N  Do you have problems with loss of bowel control? N  Managing your Medications? N  Managing your Finances? N  Housekeeping or managing your Housekeeping? N    Patient Care Team: Pincus Sanes, MD as PCP - General (Internal Medicine)  Indicate any recent Medical Services you may have received from other than [facility] providers in the past year (date may be  approximate).     Assessment:   This is a routine wellness examination for Dominique Griffin.  Hearing/Vision screen No results found.  Dietary issues and exercise activities discussed:     Goals Addressed   None   Depression Screen    10/02/2021   10:45 AM 09/27/2020    7:21 PM  PHQ 2/9 Scores  PHQ - 2 Score 0 0  PHQ- 9 Score  1    Fall Risk    04/07/2022   10:17 AM 10/02/2021   10:44 AM  Fall Risk   Falls in the past year? 0 0  Number falls in past yr: 0 0  Injury with Fall? 0 0  Risk for fall due to :  No Fall Risks  Follow up  Falls evaluation completed    FALL RISK PREVENTION PERTAINING TO THE HOME:  Any stairs in or around the home? {YES/NO:21197} If so, are there any without handrails? {YES/NO:21197} Home free of loose throw rugs in walkways, pet beds, electrical cords, etc? {YES/NO:21197} Adequate lighting in your home to reduce risk of falls? {YES/NO:21197}  ASSISTIVE DEVICES UTILIZED TO PREVENT FALLS:  Life alert? {YES/NO:21197} Use of a cane, walker or w/c? {YES/NO:21197} Grab bars in the bathroom? {YES/NO:21197} Shower chair or bench in shower? {YES/NO:21197} Elevated toilet seat or a handicapped toilet? {YES/NO:21197}  Cognitive Function:        Immunizations Immunization History  Administered Date(s) Administered   Influenza, High Dose Seasonal PF 04/08/2019   Influenza-Unspecified 04/06/2015, 04/10/2016, 03/10/2019, 04/15/2021   PFIZER(Purple Top)SARS-COV-2 Vaccination 07/30/2019, 08/20/2019, 04/11/2020, 10/28/2021   Pfizer Covid-19 Vaccine Bivalent Booster 32yrs & up 03/23/2021, 12/21/2021   Tdap 01/24/2008   Zoster Recombinat (Shingrix) 06/09/2019, 10/26/2019    {TDAP status:2101805}  {Flu Vaccine status:2101806}  {Pneumococcal vaccine status:2101807}  {Covid-19 vaccine status:2101808}  Qualifies for Shingles Vaccine? {YES/NO:21197}  Zostavax completed {YES/NO:21197}  {Shingrix Completed?:2101804}  Screening Tests Health Maintenance   Topic Date Due   INFLUENZA VACCINE  02/06/2022   Pneumonia Vaccine 15+ Years old (1 - PCV) 10/03/2022 (Originally 11/25/2016)   TETANUS/TDAP  10/03/2022 (Originally 01/23/2018)   COVID-19 Vaccine (5 - Pfizer series) 04/22/2022   DEXA SCAN  11/11/2022   MAMMOGRAM  05/09/2023   COLONOSCOPY (Pts 45-38yrs Insurance coverage will need to be confirmed)  12/14/2029   Hepatitis C Screening  Completed   Zoster Vaccines- Shingrix  Completed   HPV VACCINES  Aged Out    Health Maintenance  Health Maintenance Due  Topic Date Due   INFLUENZA VACCINE  02/06/2022    Colorectal cancer screening: Type of screening: Colonoscopy. Completed 12/15/19. Repeat every 10 years  Mammogram status: Completed 05/08/21. Repeat every year  Bone Density status: Completed 11/10/20. Results reflect: Bone density  results: OSTEOPENIA. Repeat every 2 years.  Lung Cancer Screening: (Low Dose CT Chest recommended if Age 12-80 years, 30 pack-year currently smoking OR have quit w/in 15years.) does not qualify.   Additional Screening:  Hepatitis C Screening: does qualify; Completed 10/02/21  Vision Screening: Recommended annual ophthalmology exams for early detection of glaucoma and other disorders of the eye. Is the patient up to date with their annual eye exam?  Yes  Who is the provider or what is the name of the office in which the patient attends annual eye exams? Dr. Cathey Endow If pt is not established with a provider, would they like to be referred to a provider to establish care?  N/A .   Dental Screening: Recommended annual dental exams for proper oral hygiene  Community Resource Referral / Chronic Care Management: CRR required this visit?  {YES/NO:21197}  CCM required this visit?  {YES/NO:21197}     Plan:     I have personally reviewed and noted the following in the patient's chart:   Medical and social history Use of alcohol, tobacco or illicit drugs  Current medications and supplements including opioid  prescriptions. Patient is not currently taking opioid prescriptions. Functional ability and status Nutritional status Physical activity Advanced directives List of other physicians Hospitalizations, surgeries, and ER visits in previous 12 months Vitals Screenings to include cognitive, depression, and falls Referrals and appointments  In addition, I have reviewed and discussed with patient certain preventive protocols, quality metrics, and best practice recommendations. A written personalized care plan for preventive services as well as general preventive health recommendations were provided to patient.     Wanda Plump, RN   04/10/2022   Nurse Notes:  Ms. Huebert , Thank you for taking time to come for your Medicare Wellness Visit. I appreciate your ongoing commitment to your health goals. Please review the following plan we discussed and let me know if I can assist you in the future.   These are the goals we discussed:  Goals   None     This is a list of the screening recommended for you and due dates:  Health Maintenance  Topic Date Due   Flu Shot  02/06/2022   Pneumonia Vaccine (1 - PCV) 10/03/2022*   Tetanus Vaccine  10/03/2022*   COVID-19 Vaccine (5 - Pfizer series) 04/22/2022   DEXA scan (bone density measurement)  11/11/2022   Mammogram  05/09/2023   Colon Cancer Screening  12/14/2029   Hepatitis C Screening: USPSTF Recommendation to screen - Ages 18-79 yo.  Completed   Zoster (Shingles) Vaccine  Completed   HPV Vaccine  Aged Out  *Topic was postponed. The date shown is not the original due date.

## 2022-04-10 NOTE — Patient Instructions (Signed)

## 2022-04-11 ENCOUNTER — Ambulatory Visit: Payer: Medicare PPO | Admitting: *Deleted

## 2022-04-11 DIAGNOSIS — Z0000 Encounter for general adult medical examination without abnormal findings: Secondary | ICD-10-CM

## 2022-04-18 ENCOUNTER — Ambulatory Visit (INDEPENDENT_AMBULATORY_CARE_PROVIDER_SITE_OTHER): Payer: Medicare PPO

## 2022-04-18 VITALS — Ht 66.0 in

## 2022-04-18 DIAGNOSIS — Z0000 Encounter for general adult medical examination without abnormal findings: Secondary | ICD-10-CM | POA: Diagnosis not present

## 2022-04-18 NOTE — Patient Instructions (Signed)
Dominique Griffin , Thank you for taking time to come for your Medicare Wellness Visit. I appreciate your ongoing commitment to your health goals. Please review the following plan we discussed and let me know if I can assist you in the future.   These are the goals we discussed:  Goals      My goal is to maintain my health, get more stronger and flexible.        This is a list of the screening recommended for you and due dates:  Health Maintenance  Topic Date Due   Pneumonia Vaccine (1 - PCV) 10/03/2022*   Tetanus Vaccine  10/03/2022*   COVID-19 Vaccine (5 - Pfizer series) 08/03/2022   DEXA scan (bone density measurement)  11/11/2022   Mammogram  05/09/2023   Colon Cancer Screening  12/14/2029   Flu Shot  Completed   Hepatitis C Screening: USPSTF Recommendation to screen - Ages 18-79 yo.  Completed   Zoster (Shingles) Vaccine  Completed   HPV Vaccine  Aged Out  *Topic was postponed. The date shown is not the original due date.    Advanced directives: YES  Conditions/risks identified: YES  Next appointment: Follow up in one year for your annual wellness visit.   Preventive Care 39 Years and Older, Female Preventive care refers to lifestyle choices and visits with your health care provider that can promote health and wellness. What does preventive care include? A yearly physical exam. This is also called an annual well check. Dental exams once or twice a year. Routine eye exams. Ask your health care provider how often you should have your eyes checked. Personal lifestyle choices, including: Daily care of your teeth and gums. Regular physical activity. Eating a healthy diet. Avoiding tobacco and drug use. Limiting alcohol use. Practicing safe sex. Taking low-dose aspirin every day. Taking vitamin and mineral supplements as recommended by your health care provider. What happens during an annual well check? The services and screenings done by your health care provider during your  annual well check will depend on your age, overall health, lifestyle risk factors, and family history of disease. Counseling  Your health care provider may ask you questions about your: Alcohol use. Tobacco use. Drug use. Emotional well-being. Home and relationship well-being. Sexual activity. Eating habits. History of falls. Memory and ability to understand (cognition). Work and work Statistician. Reproductive health. Screening  You may have the following tests or measurements: Height, weight, and BMI. Blood pressure. Lipid and cholesterol levels. These may be checked every 5 years, or more frequently if you are over 60 years old. Skin check. Lung cancer screening. You may have this screening every year starting at age 70 if you have a 30-pack-year history of smoking and currently smoke or have quit within the past 15 years. Fecal occult blood test (FOBT) of the stool. You may have this test every year starting at age 70. Flexible sigmoidoscopy or colonoscopy. You may have a sigmoidoscopy every 5 years or a colonoscopy every 10 years starting at age 70. Hepatitis C blood test. Hepatitis B blood test. Sexually transmitted disease (STD) testing. Diabetes screening. This is done by checking your blood sugar (glucose) after you have not eaten for a while (fasting). You may have this done every 1-3 years. Bone density scan. This is done to screen for osteoporosis. You may have this done starting at age 70. Mammogram. This may be done every 1-2 years. Talk to your health care provider about how often you should have  regular mammograms. Talk with your health care provider about your test results, treatment options, and if necessary, the need for more tests. Vaccines  Your health care provider may recommend certain vaccines, such as: Influenza vaccine. This is recommended every year. Tetanus, diphtheria, and acellular pertussis (Tdap, Td) vaccine. You may need a Td booster every 10  years. Zoster vaccine. You may need this after age 70. Pneumococcal 13-valent conjugate (PCV13) vaccine. One dose is recommended after age 70. Pneumococcal polysaccharide (PPSV23) vaccine. One dose is recommended after age 70. Talk to your health care provider about which screenings and vaccines you need and how often you need them. This information is not intended to replace advice given to you by your health care provider. Make sure you discuss any questions you have with your health care provider. Document Released: 07/22/2015 Document Revised: 03/14/2016 Document Reviewed: 04/26/2015 Elsevier Interactive Patient Education  2017 [place] Prevention in the Home Falls can cause injuries. They can happen to people of all ages. There are many things you can do to make your home safe and to help prevent falls. What can I do on the outside of my home? Regularly fix the edges of walkways and driveways and fix any cracks. Remove anything that might make you trip as you walk through a door, such as a raised step or threshold. Trim any bushes or trees on the path to your home. Use bright outdoor lighting. Clear any walking paths of anything that might make someone trip, such as rocks or tools. Regularly check to see if handrails are loose or broken. Make sure that both sides of any steps have handrails. Any raised decks and porches should have guardrails on the edges. Have any leaves, snow, or ice cleared regularly. Use sand or salt on walking paths during winter. Clean up any spills in your garage right away. This includes oil or grease spills. What can I do in the bathroom? Use night lights. Install grab bars by the toilet and in the tub and shower. Do not use towel bars as grab bars. Use non-skid mats or decals in the tub or shower. If you need to sit down in the shower, use a plastic, non-slip stool. Keep the floor dry. Clean up any water that spills on the floor as soon as it  happens. Remove soap buildup in the tub or shower regularly. Attach bath mats securely with double-sided non-slip rug tape. Do not have throw rugs and other things on the floor that can make you trip. What can I do in the bedroom? Use night lights. Make sure that you have a light by your bed that is easy to reach. Do not use any sheets or blankets that are too big for your bed. They should not hang down onto the floor. Have a firm chair that has side arms. You can use this for support while you get dressed. Do not have throw rugs and other things on the floor that can make you trip. What can I do in the kitchen? Clean up any spills right away. Avoid walking on wet floors. Keep items that you use a lot in easy-to-reach places. If you need to reach something above you, use a strong step stool that has a grab bar. Keep electrical cords out of the way. Do not use floor polish or wax that makes floors slippery. If you must use wax, use non-skid floor wax. Do not have throw rugs and other things on the floor that  can make you trip. What can I do with my stairs? Do not leave any items on the stairs. Make sure that there are handrails on both sides of the stairs and use them. Fix handrails that are broken or loose. Make sure that handrails are as long as the stairways. Check any carpeting to make sure that it is firmly attached to the stairs. Fix any carpet that is loose or worn. Avoid having throw rugs at the top or bottom of the stairs. If you do have throw rugs, attach them to the floor with carpet tape. Make sure that you have a light switch at the top of the stairs and the bottom of the stairs. If you do not have them, ask someone to add them for you. What else can I do to help prevent falls? Wear shoes that: Do not have high heels. Have rubber bottoms. Are comfortable and fit you well. Are closed at the toe. Do not wear sandals. If you use a stepladder: Make sure that it is fully opened.  Do not climb a closed stepladder. Make sure that both sides of the stepladder are locked into place. Ask someone to hold it for you, if possible. Clearly mark and make sure that you can see: Any grab bars or handrails. First and last steps. Where the edge of each step is. Use tools that help you move around (mobility aids) if they are needed. These include: Canes. Walkers. Scooters. Crutches. Turn on the lights when you go into a dark area. Replace any light bulbs as soon as they burn out. Set up your furniture so you have a clear path. Avoid moving your furniture around. If any of your floors are uneven, fix them. If there are any pets around you, be aware of where they are. Review your medicines with your doctor. Some medicines can make you feel dizzy. This can increase your chance of falling. Ask your doctor what other things that you can do to help prevent falls. This information is not intended to replace advice given to you by your health care provider. Make sure you discuss any questions you have with your health care provider. Document Released: 04/21/2009 Document Revised: 12/01/2015 Document Reviewed: 07/30/2014 Elsevier Interactive Patient Education  2017 [employer].

## 2022-04-18 NOTE — Progress Notes (Addendum)
Virtual Visit via Telephone Note  I connected with  Dominique Griffin on 04/18/22 at  3:30 PM EDT by telephone and verified that I am speaking with the correct person using two identifiers.  Location: Patient: HOME Provider: [facility] Persons participating in the virtual visit: patient/Nurse Health Advisor   I discussed the limitations, risks, security and privacy concerns of performing an evaluation and management service by telephone and the availability of in person appointments. The patient expressed understanding and agreed to proceed.  Interactive audio and video telecommunications were attempted between this nurse and patient, however failed, due to patient having technical difficulties OR patient did not have access to video capability.  We continued and completed visit with audio only.  Some vital signs may be absent or patient reported.   Mickeal Needy, LPN  Subjective:   Dominique Griffin is a 71 y.o. female who presents for an Initial Medicare Annual Wellness Visit.  Review of Systems     Cardiac Risk Factors include: advanced age (>62men, >53 women);family history of premature cardiovascular disease     Objective:    Today's Vitals   04/18/22 1551  Height: 5\' 6"  (1.676 m)  PainSc: 0-No pain   Body mass index is 31.8 kg/m.     04/18/2022    3:35 PM 01/08/2022    8:08 AM  Advanced Directives  Does Patient Have a Medical Advance Directive? Yes Yes  Type of 03/11/2022 of [place];Living will Healthcare Power of [place];Living will  Does patient want to make changes to medical advance directive?  No - Patient declined  Copy of Healthcare Power of Attorney in Chart? No - copy requested     Current Medications (verified) Outpatient Encounter Medications as of 04/18/2022  Medication Sig   calcium carbonate (TUMS - DOSED IN MG ELEMENTAL CALCIUM) 500 MG chewable tablet Chew 1 tablet by mouth daily.   Cholecalciferol 50 MCG (2000  UT) TABS Take by mouth.   ibuprofen (ADVIL) 200 MG tablet Take by mouth.   meclizine (ANTIVERT) 12.5 MG tablet Take 12.5 mg by mouth 3 (three) times daily as needed for dizziness.   Multiple Vitamin (MULTIVITAMIN ADULT PO) Take by mouth.   pseudoephedrine (SUDAFED) 120 MG 12 hr tablet Take 120 mg by mouth 2 (two) times daily.   No facility-administered encounter medications on file as of 04/18/2022.    Allergies (verified) Patient has no known allergies.   History: No past medical history on file. Past Surgical History:  Procedure Laterality Date   ABDOMINAL HYSTERECTOMY     partial - still has ovaries   BREAST EXCISIONAL BIOPSY Left    skin bbiopsy   Family History  Problem Relation Age of Onset   Breast cancer Mother    Stroke Mother    Breast cancer Maternal Aunt    Social History   Socioeconomic History   Marital status: Married    Spouse name: Not on file   Number of children: 0   Years of education: Not on file   Highest education level: Not on file  Occupational History   Not on file  Tobacco Use   Smoking status: Former   Smokeless tobacco: Never   Tobacco comments:    smoked minimally  Substance and Sexual Activity   Alcohol use: Not on file   Drug use: Not on file   Sexual activity: Not on file  Other Topics Concern   Not on file  Social History Narrative   Retired  biology professor - [employer]   Social Determinants of Health   Financial Resource Strain: Low Risk  (04/18/2022)   Overall Financial Resource Strain (CARDIA)    Difficulty of Paying Living Expenses: Not hard at all  Food Insecurity: No Food Insecurity (04/18/2022)   Hunger Vital Sign    Worried About Running Out of Food in the Last Year: Never true    Ran Out of Food in the Last Year: Never true  Transportation Needs: No Transportation Needs (04/18/2022)   PRAPARE - Administrator, Civil Service (Medical): No    Lack of Transportation (Non-Medical): No  Physical Activity:  Sufficiently Active (04/18/2022)   Exercise Vital Sign    Days of Exercise per Week: 7 days    Minutes of Exercise per Session: 40 min  Stress: No Stress Concern Present (04/18/2022)   [employer] of Occupational Health - Occupational Stress Questionnaire    Feeling of Stress : Not at all  Social Connections: Unknown (04/18/2022)   Social Connection and Isolation Panel [NHANES]    Frequency of Communication with Friends and Family: More than three times a week    Frequency of Social Gatherings with Friends and Family: More than three times a week    Attends Religious Services: Not on Marketing executive or Organizations: Yes    Attends Engineer, structural: More than 4 times per year    Marital Status: Married    Tobacco Counseling Counseling given: Not Answered Tobacco comments: smoked minimally   Clinical Intake:  Pre-visit preparation completed: Yes  Pain : No/denies pain Pain Score: 0-No pain     BMI - recorded: 31.81 (10/02/2021) Nutritional Status: BMI > 30  Obese Nutritional Risks: None Diabetes: No  How often do you need to have someone help you when you read instructions, pamphlets, or other written materials from your doctor or pharmacy?: 1 - Never What is the last grade level you completed in school?: Post Graduate Degree  Diabetic? NO  Interpreter Needed?: No  Information entered by :: Susie Cassette, LPN.   Activities of Daily Living    04/18/2022    3:45 PM 04/14/2022    9:26 AM  In your present state of health, do you have any difficulty performing the following activities:  Hearing? 0 0  Vision? 0 0  Difficulty concentrating or making decisions? 0 0  Walking or climbing stairs? 0 0  Dressing or bathing? 0 0  Doing errands, shopping? 0 0  Preparing Food and eating ? N N  Using the Toilet? N N  In the past six months, have you accidently leaked urine? N N  Do you have problems with loss of bowel control? N N   Managing your Medications? N N  Managing your Finances? N N  Housekeeping or managing your Housekeeping? N N    Patient Care Team: Pincus Sanes, MD as PCP - General (Internal Medicine) Sinda Du, MD as Consulting Physician (Ophthalmology)  Indicate any recent Medical Services you may have received from other than [facility] providers in the past year (date may be approximate).     Assessment:   This is a routine wellness examination for Eliannah.  Hearing/Vision screen Hearing Screening - Comments:: Denies hearing difficulties.  Vision Screening - Comments:: Wears rx glasses - up to date with routine eye exams with Sinda Du, MD.   Dietary issues and exercise activities discussed: Current Exercise Habits: Home exercise routine, Type of exercise: walking;strength  training/weights;stretching;Other - see comments, Time (Minutes): 40, Frequency (Times/Week): 7, Weekly Exercise (Minutes/Week): 280, Intensity: Moderate, Exercise limited by: orthopedic condition(s)   Goals Addressed             This Visit's Progress    My goal is to maintain my health, get more stronger and flexible.        Depression Screen    04/18/2022    3:38 PM 10/02/2021   10:45 AM 09/27/2020    7:21 PM  PHQ 2/9 Scores  PHQ - 2 Score 0 0 0  PHQ- 9 Score   1    Fall Risk    04/18/2022    3:36 PM 04/14/2022    9:26 AM 04/07/2022   10:17 AM 10/02/2021   10:44 AM  Fall Risk   Falls in the past year? 0 0 0   0 0  Number falls in past yr: 0 0 0   0 0  Injury with Fall? 0 0 0   0 0  Risk for fall due to : No Fall Risks   No Fall Risks  Follow up Falls prevention discussed   Falls evaluation completed    FALL RISK PREVENTION PERTAINING TO THE HOME:  Any stairs in or around the home? No  If so, are there any without handrails? No  Home free of loose throw rugs in walkways, pet beds, electrical cords, etc? Yes  Adequate lighting in your home to reduce risk of falls? Yes   ASSISTIVE DEVICES  UTILIZED TO PREVENT FALLS:  Life alert? No  Use of a cane, walker or w/c? No  Grab bars in the bathroom? Yes  Shower chair or bench in shower? No  Elevated toilet seat or a handicapped toilet? Yes   TIMED UP AND GO:  Was the test performed? No . PHONE VISIT  Cognitive Function:        04/18/2022    3:49 PM  6CIT Screen  What Year? 0 points  What month? 0 points  What time? 0 points  Count back from 20 0 points  Months in reverse 0 points  Repeat phrase 0 points  Total Score 0 points    Immunizations Immunization History  Administered Date(s) Administered   Fluad Quad(high Dose 65+) 04/16/2022   Influenza, High Dose Seasonal PF 04/08/2019   Influenza-Unspecified 04/06/2015, 04/10/2016, 03/10/2019, 04/15/2021   PFIZER(Purple Top)SARS-COV-2 Vaccination 07/30/2019, 08/20/2019, 04/11/2020, 10/28/2021   Pfizer Covid-19 Vaccine Bivalent Booster 22yrs & up 03/23/2021, 12/21/2021, 04/03/2022   Tdap 01/24/2008   Zoster Recombinat (Shingrix) 06/09/2019, 10/26/2019    TDAP status: Due, Education has been provided regarding the importance of this vaccine. Advised may receive this vaccine at local pharmacy or Health Dept. Aware to provide a copy of the vaccination record if obtained from local pharmacy or Health Dept. Verbalized acceptance and understanding.  Flu Vaccine status: Up to date  Pneumococcal vaccine status: Due, Education has been provided regarding the importance of this vaccine. Advised may receive this vaccine at local pharmacy or Health Dept. Aware to provide a copy of the vaccination record if obtained from local pharmacy or Health Dept. Verbalized acceptance and understanding.  Covid-19 vaccine status: Completed vaccines  Qualifies for Shingles Vaccine? Yes   Zostavax completed No   Shingrix Completed?: Yes  Screening Tests Health Maintenance  Topic Date Due   Pneumonia Vaccine 59+ Years old (1 - PCV) 10/03/2022 (Originally 11/25/2016)   TETANUS/TDAP   10/03/2022 (Originally 01/23/2018)   COVID-19 Vaccine (5 - Pfizer series)  08/03/2022   DEXA SCAN  11/11/2022   MAMMOGRAM  05/09/2023   COLONOSCOPY (Pts 45-21yrs Insurance coverage will need to be confirmed)  12/14/2029   INFLUENZA VACCINE  Completed   Hepatitis C Screening  Completed   Zoster Vaccines- Shingrix  Completed   HPV VACCINES  Aged Out    Health Maintenance  There are no preventive care reminders to display for this patient.  Colorectal cancer screening: Type of screening: Colonoscopy. Completed 12/15/2019. Repeat every 5-10 years  Mammogram status: Completed 12/11/2021. Repeat every year (SCHEDULED FOR 05/14/2022)  Bone Density status: Completed 11/10/2020. Results reflect: Bone density results: OSTEOPOROSIS. Repeat every 2 years.  Lung Cancer Screening: (Low Dose CT Chest recommended if Age 30-80 years, 30 pack-year currently smoking OR have quit w/in 15years.) does not qualify.   Lung Cancer Screening Referral: NO  Additional Screening:  Hepatitis C Screening: does qualify; Completed 09/12/2021  Vision Screening: Recommended annual ophthalmology exams for early detection of glaucoma and other disorders of the eye. Is the patient up to date with their annual eye exam?  Yes  Who is the provider or what is the name of the office in which the patient attends annual eye exams? Sinda Du, MD. If pt is not established with a provider, would they like to be referred to a provider to establish care? No .   Dental Screening: Recommended annual dental exams for proper oral hygiene  Community Resource Referral / Chronic Care Management: CRR required this visit?  No   CCM required this visit?  No      Plan:     I have personally reviewed and noted the following in the patient's chart:   Medical and social history Use of alcohol, tobacco or illicit drugs  Current medications and supplements including opioid prescriptions. Patient is not currently taking opioid  prescriptions. Functional ability and status Nutritional status Physical activity Advanced directives List of other physicians Hospitalizations, surgeries, and ER visits in previous 12 months Vitals Screenings to include cognitive, depression, and falls Referrals and appointments  In addition, I have reviewed and discussed with patient certain preventive protocols, quality metrics, and best practice recommendations. A written personalized care plan for preventive services as well as general preventive health recommendations were provided to patient.     Mickeal Needy, LPN   [id number]   Nurse Notes: N/A

## 2022-04-29 ENCOUNTER — Encounter (INDEPENDENT_AMBULATORY_CARE_PROVIDER_SITE_OTHER): Payer: Self-pay

## 2022-05-14 ENCOUNTER — Ambulatory Visit
Admission: RE | Admit: 2022-05-14 | Discharge: 2022-05-14 | Disposition: A | Discharge status: Home / Self Care | Payer: Medicare PPO | Source: Ambulatory Visit | Attending: Internal Medicine | Admitting: Internal Medicine

## 2022-05-14 DIAGNOSIS — Z1231 Encounter for screening mammogram for malignant neoplasm of breast: Secondary | ICD-10-CM

## 2022-05-22 ENCOUNTER — Encounter: Payer: Self-pay | Admitting: Internal Medicine

## 2022-07-05 ENCOUNTER — Encounter (HOSPITAL_BASED_OUTPATIENT_CLINIC_OR_DEPARTMENT_OTHER): Payer: Medicare PPO | Admitting: Obstetrics & Gynecology

## 2022-07-30 DIAGNOSIS — D225 Melanocytic nevi of trunk: Secondary | ICD-10-CM | POA: Diagnosis not present

## 2022-07-30 DIAGNOSIS — L820 Inflamed seborrheic keratosis: Secondary | ICD-10-CM | POA: Diagnosis not present

## 2022-07-30 DIAGNOSIS — L821 Other seborrheic keratosis: Secondary | ICD-10-CM | POA: Diagnosis not present

## 2022-07-30 DIAGNOSIS — D2262 Melanocytic nevi of left upper limb, including shoulder: Secondary | ICD-10-CM | POA: Diagnosis not present

## 2022-07-30 DIAGNOSIS — L218 Other seborrheic dermatitis: Secondary | ICD-10-CM | POA: Diagnosis not present

## 2022-08-29 ENCOUNTER — Encounter (HOSPITAL_BASED_OUTPATIENT_CLINIC_OR_DEPARTMENT_OTHER): Payer: Self-pay | Admitting: Obstetrics & Gynecology

## 2022-08-29 ENCOUNTER — Ambulatory Visit (HOSPITAL_BASED_OUTPATIENT_CLINIC_OR_DEPARTMENT_OTHER): Payer: Medicare PPO | Admitting: Obstetrics & Gynecology

## 2022-08-29 VITALS — BP 134/64 | HR 73 | Ht 66.0 in | Wt 179.8 lb

## 2022-08-29 DIAGNOSIS — M85852 Other specified disorders of bone density and structure, left thigh: Secondary | ICD-10-CM

## 2022-08-29 DIAGNOSIS — Z780 Asymptomatic menopausal state: Secondary | ICD-10-CM | POA: Diagnosis not present

## 2022-08-29 DIAGNOSIS — G20A1 Parkinson's disease without dyskinesia, without mention of fluctuations: Secondary | ICD-10-CM | POA: Diagnosis not present

## 2022-08-29 DIAGNOSIS — T148XXA Other injury of unspecified body region, initial encounter: Secondary | ICD-10-CM

## 2022-08-29 DIAGNOSIS — Z90710 Acquired absence of both cervix and uterus: Secondary | ICD-10-CM

## 2022-08-29 DIAGNOSIS — R5382 Chronic fatigue, unspecified: Secondary | ICD-10-CM | POA: Diagnosis not present

## 2022-08-29 DIAGNOSIS — R233 Spontaneous ecchymoses: Secondary | ICD-10-CM | POA: Diagnosis not present

## 2022-08-29 NOTE — Addendum Note (Signed)
Addended by: Blenda Nicely on: 08/29/2022 05:27 PM   Modules accepted: Orders

## 2022-08-29 NOTE — Progress Notes (Addendum)
72 y.o.  Married White or Caucasian G0 female here for new patient exam.  She has hx of TAH in 1993 due to fibroids and heavy bleeding.  Did well from surgical standpoint.  She has never been on HRT.    She has been diagnosed with Parkinson's.  Followed at [facility].  Has been on a study medication for a clinical trial.  She will be tapering off the trial medication and will transition to sinemet.  She is having some issues with light headed changed.    Having some significant bruising.  Did communicate with neurologist at [facility] and has been advised this is not relate to parkinson's medication.  Almost anything is causing bruising.  No gum bleeding with tooth brushing.    No LMP recorded. Patient is postmenopausal.          Sexually active: No. But intimate with spouse. The current method of family planning is status post hysterectomy.    Exercising: Doing rock steady boxing and exercise with [facility] Smoker:  for a couple of years in grad school  Health Maintenance: Pap:  not indicated History of abnormal Pap:  if so, it was a long time ago MMG:  05/14/2022 Colonoscopy:  2021, follow up 5 years BMD:   2022, worse t score -1.4 Screening Labs: with Dr. Quay Burow   reports that she has quit smoking. She has never used smokeless tobacco.  No past medical history on file.  Past Surgical History:  Procedure Laterality Date   ABDOMINAL HYSTERECTOMY  1993   fibroids, ovaries remain   BREAST EXCISIONAL BIOPSY Left    skin bbiopsy    Current Outpatient Medications  Medication Sig Dispense Refill   calcium carbonate (TUMS - DOSED IN MG ELEMENTAL CALCIUM) 500 MG chewable tablet Chew 1 tablet by mouth daily.     camphor-menthol (SARNA) lotion Apply 1 Application topically as needed for itching.     carbidopa-levodopa (PARCOPA) 25-100 MG disintegrating tablet Take 1 tablet by mouth 3 (three) times daily.     Cholecalciferol 50 MCG (2000 UT) TABS Take by mouth.     ibuprofen (ADVIL) 200 MG  tablet Take by mouth.     meclizine (ANTIVERT) 12.5 MG tablet Take 12.5 mg by mouth 3 (three) times daily as needed for dizziness.     Multiple Vitamin (MULTIVITAMIN ADULT PO) Take by mouth.     pseudoephedrine (SUDAFED) 120 MG 12 hr tablet Take 120 mg by mouth 2 (two) times daily.     No current facility-administered medications for this visit.    Family History  Problem Relation Age of Onset   Breast cancer Mother    Stroke Mother    Breast cancer Maternal Aunt     ROS: Constitutional: negative Genitourinary:negative  Exam:   BP 134/64 (BP Location: Right Arm, Patient Position: Sitting, Cuff Size: Large)   Pulse 73   Ht '5\' 6"'$  (1.676 m) Comment: Reported  Wt 179 lb 12.8 oz (81.6 kg)   BMI 29.02 kg/m   Height: '5\' 6"'$  (167.6 cm) (Reported)  General appearance: alert, cooperative and appears stated age Head: Normocephalic, without obvious abnormality, atraumatic Neck: no adenopathy, supple, symmetrical, trachea midline and thyroid normal to inspection and palpation Lungs: clear to auscultation bilaterally Breasts: normal appearance, no masses or tenderness Heart: regular rate and rhythm Abdomen: soft, non-tender; bowel sounds normal; no masses,  no organomegaly Extremities: extremities normal, atraumatic, no cyanosis or edema Skin: Skin color, texture, turgor normal. Extensive bruising on arms, legs and hip  region.  Petechia on lower extremities and noted on right breast.   Lymph nodes: Cervical, supraclavicular, and axillary nodes normal. No abnormal inguinal nodes palpated Neurologic: Grossly normal   Pelvic: External genitalia:  no lesions              Urethra:  normal appearing urethra with no masses, tenderness or lesions              Bartholins and Skenes: normal                 Vagina: normal appearing vagina with normal color and no discharge, no lesions              Cervix: absent              Pap taken: No. Bimanual Exam:  Uterus:  uterus absent               Adnexa: no mass, fullness, tenderness               Rectovaginal: Confirms               Anus:  normal sphincter tone, no lesions  Chaperone, Octaviano Batty, CMA, was present for exam.  Assessment/Plan: 1. Postmenopausal - never on HRT  2. H/O abdominal hysterectomy  3.  Bruising - CBC - Protime-INR - may need to review with hematology/oncology as well.  4. Parkinson's disease without dyskinesia or fluctuating manifestations  5. Osteopenia of neck of left femur - plan to repeat next year

## 2022-08-30 LAB — CBC
Hematocrit: 42.6 % (ref 34.0–46.6)
Hemoglobin: 13.9 g/dL (ref 11.1–15.9)
MCH: 29.8 pg (ref 26.6–33.0)
MCHC: 32.6 g/dL (ref 31.5–35.7)
MCV: 91 fL (ref 79–97)
RBC: 4.67 x10E6/uL (ref 3.77–5.28)
RDW: 12 % (ref 11.7–15.4)
WBC: 7 10*3/uL (ref 3.4–10.8)

## 2022-08-30 LAB — PROTIME-INR
INR: 1 (ref 0.9–1.2)
Prothrombin Time: 10.4 s (ref 9.1–12.0)

## 2022-08-31 ENCOUNTER — Other Ambulatory Visit (HOSPITAL_BASED_OUTPATIENT_CLINIC_OR_DEPARTMENT_OTHER): Payer: Self-pay | Admitting: Obstetrics & Gynecology

## 2022-08-31 DIAGNOSIS — T148XXA Other injury of unspecified body region, initial encounter: Secondary | ICD-10-CM

## 2022-09-07 ENCOUNTER — Telehealth: Payer: Self-pay | Admitting: *Deleted

## 2022-09-07 ENCOUNTER — Inpatient Hospital Stay: Payer: Medicare PPO | Attending: Hematology & Oncology

## 2022-09-07 ENCOUNTER — Inpatient Hospital Stay: Payer: Medicare PPO | Admitting: Family

## 2022-09-07 ENCOUNTER — Other Ambulatory Visit: Payer: Self-pay | Admitting: Family

## 2022-09-07 ENCOUNTER — Encounter: Payer: Self-pay | Admitting: Family

## 2022-09-07 VITALS — BP 143/71 | HR 77 | Temp 99.0°F | Resp 17 | Ht 66.0 in | Wt 179.0 lb

## 2022-09-07 DIAGNOSIS — E611 Iron deficiency: Secondary | ICD-10-CM

## 2022-09-07 DIAGNOSIS — K219 Gastro-esophageal reflux disease without esophagitis: Secondary | ICD-10-CM

## 2022-09-07 DIAGNOSIS — T148XXA Other injury of unspecified body region, initial encounter: Secondary | ICD-10-CM

## 2022-09-07 DIAGNOSIS — D693 Immune thrombocytopenic purpura: Secondary | ICD-10-CM

## 2022-09-07 DIAGNOSIS — D696 Thrombocytopenia, unspecified: Secondary | ICD-10-CM

## 2022-09-07 DIAGNOSIS — Z87891 Personal history of nicotine dependence: Secondary | ICD-10-CM

## 2022-09-07 LAB — CBC WITH DIFFERENTIAL (CANCER CENTER ONLY)
Abs Immature Granulocytes: 0.08 10*3/uL — ABNORMAL HIGH (ref 0.00–0.07)
Basophils Absolute: 0.1 10*3/uL (ref 0.0–0.1)
Basophils Relative: 1 %
Eosinophils Absolute: 0.1 10*3/uL (ref 0.0–0.5)
Eosinophils Relative: 2 %
HCT: 40.7 % (ref 36.0–46.0)
Hemoglobin: 13.6 g/dL (ref 12.0–15.0)
Immature Granulocytes: 1 %
Lymphocytes Relative: 17 %
Lymphs Abs: 1.2 10*3/uL (ref 0.7–4.0)
MCH: 30.2 pg (ref 26.0–34.0)
MCHC: 33.4 g/dL (ref 30.0–36.0)
MCV: 90.4 fL (ref 80.0–100.0)
Monocytes Absolute: 0.6 10*3/uL (ref 0.1–1.0)
Monocytes Relative: 9 %
Neutro Abs: 4.8 10*3/uL (ref 1.7–7.7)
Neutrophils Relative %: 70 %
Platelet Count: 8 10*3/uL — CL (ref 150–400)
RBC: 4.5 MIL/uL (ref 3.87–5.11)
RDW: 12 % (ref 11.5–15.5)
WBC Count: 7 10*3/uL (ref 4.0–10.5)
nRBC: 0 % (ref 0.0–0.2)

## 2022-09-07 LAB — CMP (CANCER CENTER ONLY)
ALT: <5 U/L (ref 0–44)
AST: 10 U/L — ABNORMAL LOW (ref 15–41)
Albumin: 4.7 g/dL (ref 3.5–5.0)
Alkaline Phosphatase: 62 U/L (ref 38–126)
Anion gap: 9 (ref 5–15)
BUN: 10 mg/dL (ref 8–23)
CO2: 26 mmol/L (ref 22–32)
Calcium: 9.4 mg/dL (ref 8.9–10.3)
Chloride: 103 mmol/L (ref 98–111)
Creatinine: 0.76 mg/dL (ref 0.44–1.00)
GFR, Estimated: >60 mL/min (ref >60)
Glucose, Bld: 109 mg/dL — ABNORMAL HIGH (ref 70–99)
Potassium: 4.3 mmol/L (ref 3.5–5.1)
Sodium: 138 mmol/L (ref 135–145)
Total Bilirubin: 2 mg/dL — ABNORMAL HIGH (ref 0.3–1.2)
Total Protein: 6.4 g/dL — ABNORMAL LOW (ref 6.5–8.1)

## 2022-09-07 LAB — LACTATE DEHYDROGENASE: LDH: 130 U/L (ref 98–192)

## 2022-09-07 LAB — FERRITIN: Ferritin: 41 ng/mL (ref 11–307)

## 2022-09-07 LAB — IRON AND IRON BINDING CAPACITY (CC-WL,HP ONLY)
Iron: 120 ug/dL (ref 28–170)
Saturation Ratios: 30 % (ref 10.4–31.8)
TIBC: 396 ug/dL (ref 250–450)
UIBC: 276 ug/dL (ref 148–442)

## 2022-09-07 LAB — RETICULOCYTES
Immature Retic Fract: 7.2 % (ref 2.3–15.9)
RBC.: 4.47 MIL/uL (ref 3.87–5.11)
Retic Count, Absolute: 68.4 10*3/uL (ref 19.0–186.0)
Retic Ct Pct: 1.5 % (ref 0.4–3.1)

## 2022-09-07 MED ORDER — DEXAMETHASONE 4 MG PO TABS: 40.0000 mg | ORAL_TABLET | Freq: Every day | ORAL | 0 refills | Status: DC

## 2022-09-07 MED ORDER — PANTOPRAZOLE SODIUM 40 MG PO TBEC: 40.0000 mg | DELAYED_RELEASE_TABLET | Freq: Two times a day (BID) | ORAL | 0 refills | Status: DC

## 2022-09-07 MED ORDER — FLUCONAZOLE 100 MG PO TABS: 100.0000 mg | ORAL_TABLET | Freq: Every day | ORAL | 0 refills | Status: DC

## 2022-09-07 NOTE — Telephone Encounter (Signed)
Vale Haven NP notified of platelet count of 8.  Orders received for pt to get IVIG on Monday and Tuesday next week per Vale Haven NP.

## 2022-09-07 NOTE — Progress Notes (Signed)
Hematology/Oncology Consultation   Name: Dominique Griffin      MRN: [id number]    Location: Room/bed info not found  Date: 09/07/2022 Time:10:55 AM   REFERRING PHYSICIAN:  Megan Salon, MD  REASON FOR CONSULT:  Bruising    DIAGNOSIS: ITP   HISTORY OF PRESENT ILLNESS: Dominique Griffin is a pleasant 72 yo caucasian female with recent excessive bruising on the abdomen, arms and legs first noted on 08/13/2022. She has also noted petechiae of the chest and lower extremities. She has not prior history of low platelets.  Platelet count is down to 8, Hgb 13.6, MCV 90 and WBC count 7.0.  PT INR last week were within normal limits.  She has not noted any blood loss.  She has history of Parkinson's and is currently in a clinical trial with Neurologist Dominique Griffin at [facility]. She started a Dopamine agonist back in 03/2021. In May 2023, She she titrated to a clinical dose.  In June/July 2023 she developed severe lightheadedness. She has tried Meclizine, Sinemet and PT for balance without relief.  She is currently tapering off the Dopamine agonist and will finish on 09/11/2022.  Spleen is in and she has not known history of spleen or liver disease.  No organomegaly noted on exam today.  No known familial history of ITP or thrombotic event.  She stays quite active and enjoys Dominique Griffin, Dominique Griffin, stretching and strength training. She first noted her bruising with working out.  She has history of Gilbert's disease. Bilirubin today is 2.0.  No history of diabetes or thyroid disease.  No personal history of cancer. Dominique Griffin had colon (smoker) and Dominique Griffin bad breast (hormone replacement therapy).  She had her colonoscopy 12/2019. She states that she had 2 colonic polyps removed and will be due again at the 7 year mark.  Mammogram in 05/2022 was negative.  Past surgical history was partial hysterectomy at age 31.  No fever, n/v, cough, SOB, chest pain, palpitations, abdominal pain or changes in bowel or  bladder habits.  No swelling, tenderness, numbness or tingling in her extremities.  No falls or syncope reported.  Appetite and hydration have been good. Weight is 179 lbs.  No smoking, ETOH or recreational drug use.  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 She is here today with her husband Dominique Griffin.   ROS: All other 10 point review of systems is negative.   PAST MEDICAL HISTORY:   No past medical history on file.  ALLERGIES: No Known Allergies    MEDICATIONS:  Current Outpatient Medications on File Prior to Visit  Medication Sig Dispense Refill   calcium carbonate (TUMS - DOSED IN MG ELEMENTAL CALCIUM) 500 MG chewable tablet Chew 1 tablet by mouth daily.     camphor-menthol (SARNA) lotion Apply 1 Application topically as needed for itching.     carbidopa-levodopa (SINEMET IR) 25-100 MG tablet Take 1 tablet by mouth 3 (three) times daily.     Cholecalciferol 50 MCG (2000 UT) TABS Take by mouth.     ibuprofen (ADVIL) 200 MG tablet Take by mouth.     meclizine (ANTIVERT) 12.5 MG tablet Take 12.5 mg by mouth 3 (three) times daily as needed for dizziness.     Multiple Vitamin (MULTIVITAMIN ADULT PO) Take by mouth.     pseudoephedrine (SUDAFED) 120 MG 12 hr tablet Take 120 mg by mouth 2 (two) times daily.     No current facility-administered medications on file prior to visit.  PAST SURGICAL HISTORY Past Surgical History:  Procedure Laterality Date   ABDOMINAL HYSTERECTOMY  1993   fibroids, ovaries remain   BREAST EXCISIONAL BIOPSY Left    skin bbiopsy    FAMILY HISTORY: Family History  Problem Relation Age of Onset   Breast cancer Dominique Griffin    Stroke Dominique Griffin    Breast cancer Maternal Aunt     SOCIAL HISTORY:  reports that she has quit smoking. She has never used smokeless tobacco. No history on file for alcohol use and drug use.  PERFORMANCE STATUS: The patient's performance status is 1 - Symptomatic but completely ambulatory  PHYSICAL EXAM: Most Recent Vital Signs:  There were no vitals taken for this visit. BP (!) 143/71 (BP Location: Right Arm, Patient Position: Sitting)   Pulse 77   Temp 99 F (37.2 C) (Oral)   Resp 17   Ht '5\' 6"'$  (1.676 m) Comment: per pt  Wt 179 lb (81.2 kg)   SpO2 98%   BMI 28.89 kg/m   General Appearance:    Alert, cooperative, no distress, appears stated age  Head:    Normocephalic, without obvious abnormality, atraumatic  Eyes:    PERRL, conjunctiva/corneas clear, EOM's intact, fundi    benign, both eyes        Throat:   Lips, mucosa, and tongue normal; teeth and gums normal  Neck:   Supple, symmetrical, trachea midline, no adenopathy;    thyroid:  no enlargement/tenderness/nodules; no carotid   bruit or JVD  Back:     Symmetric, no curvature, ROM normal, no CVA tenderness  Lungs:     Clear to auscultation bilaterally, respirations unlabored  Chest Wall:    No tenderness or deformity   Heart:    Regular rate and rhythm, S1 and S2 normal, no murmur, rub   or gallop     Abdomen:     Soft, non-tender, bowel sounds active all four quadrants,    no masses, no organomegaly        Extremities:   Extremities normal, atraumatic, no cyanosis or edema  Pulses:   2+ and symmetric all extremities  Skin:   Skin color, texture, turgor normal, no rashes or lesions  Lymph nodes:   Cervical, supraclavicular, and axillary nodes normal  Neurologic:   CNII-XII intact, normal strength, sensation and reflexes    throughout    LABORATORY DATA:  Results for orders placed or performed in visit on 09/07/22 (from the past 48 hour(s))  Reticulocytes     Status: None   Collection Time: 09/07/22 10:30 AM  Result Value Ref Range   Retic Ct Pct 1.5 0.4 - 3.1 %   RBC. 4.47 3.87 - 5.11 MIL/uL   Retic Count, Absolute 68.4 19.0 - 186.0 K/uL   Immature Retic Fract 7.2 2.3 - 15.9 %    Comment: Performed at [facility] at [facility], [address], [place], [place] [address]      RADIOGRAPHY: No results found.      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 Platelets at this time are 8.  Patient is symptomatic with bruising and petechiae as mentioned above.  CBC and blood smear reviewed with Dr. Marin Olp. Platelets are few in number and large. No other abnormality noted at this time.  At this time we will get the patient on to: Decadron 40 mg PO daily Diflucan 100 mg PO daily for 7 days  Protonix 40 mg PO BID for 14 days.  She will start IVIG 8 am Monday 09/10/2022 daily for 2 doses.  We will repeat her lab work on Monday to reassess platelets and response to steroids. SPEP has also been added to labs.  CT of abdomen and pelvis also ordered to assess liver and spleen and patient will stop downstairs with imaging to schedule.  I was able to speak with Dr. Alfonso Patten. O'Brian the covering provider for Dr. Crisoforo Oxford at [facility] and update him on patient's new diagnosis and treatment plan. He stated to have the patient stop the Tavapadon now and start the current treatment plan as stated above with our office.  Follow-up in a week and a half.   All questions were answered. The patient knows to call the clinic with any problems, questions or concerns. We can certainly see the patient sooner if needed.   The patient was discussed with Dr. Marin Olp and he is in agreement with the aforementioned.   Greater than 50% of visit spent counseling and coordinating care.   Lottie Dawson, NP

## 2022-09-09 LAB — VON WILLEBRAND PANEL
Coagulation Factor VIII: 162 % — ABNORMAL HIGH (ref 56–140)
Ristocetin Co-factor, Plasma: 165 % (ref 50–200)
Von Willebrand Antigen, Plasma: 163 % (ref 50–200)

## 2022-09-09 LAB — COAG STUDIES INTERP REPORT

## 2022-09-10 ENCOUNTER — Inpatient Hospital Stay: Payer: Medicare PPO

## 2022-09-10 ENCOUNTER — Encounter: Payer: Self-pay | Admitting: Internal Medicine

## 2022-09-10 VITALS — BP 154/73 | HR 57 | Temp 98.6°F | Resp 20

## 2022-09-10 DIAGNOSIS — D693 Immune thrombocytopenic purpura: Secondary | ICD-10-CM | POA: Diagnosis not present

## 2022-09-10 DIAGNOSIS — Z87891 Personal history of nicotine dependence: Secondary | ICD-10-CM | POA: Diagnosis not present

## 2022-09-10 LAB — CMP (CANCER CENTER ONLY)
ALT: <5 U/L (ref 0–44)
AST: 9 U/L — ABNORMAL LOW (ref 15–41)
Albumin: 4.6 g/dL (ref 3.5–5.0)
Alkaline Phosphatase: 53 U/L (ref 38–126)
Anion gap: 11 (ref 5–15)
BUN: 14 mg/dL (ref 8–23)
CO2: 24 mmol/L (ref 22–32)
Calcium: 9.3 mg/dL (ref 8.9–10.3)
Chloride: 101 mmol/L (ref 98–111)
Creatinine: 0.77 mg/dL (ref 0.44–1.00)
GFR, Estimated: >60 mL/min (ref >60)
Glucose, Bld: 175 mg/dL — ABNORMAL HIGH (ref 70–99)
Potassium: 3.8 mmol/L (ref 3.5–5.1)
Sodium: 136 mmol/L (ref 135–145)
Total Bilirubin: 1.6 mg/dL — ABNORMAL HIGH (ref 0.3–1.2)
Total Protein: 6.6 g/dL (ref 6.5–8.1)

## 2022-09-10 LAB — CBC WITH DIFFERENTIAL (CANCER CENTER ONLY)
Abs Immature Granulocytes: 0.16 10*3/uL — ABNORMAL HIGH (ref 0.00–0.07)
Basophils Absolute: 0 10*3/uL (ref 0.0–0.1)
Basophils Relative: 0 %
Eosinophils Absolute: 0 10*3/uL (ref 0.0–0.5)
Eosinophils Relative: 0 %
HCT: 40.4 % (ref 36.0–46.0)
Hemoglobin: 13.7 g/dL (ref 12.0–15.0)
Immature Granulocytes: 1 %
Lymphocytes Relative: 3 %
Lymphs Abs: 0.5 10*3/uL — ABNORMAL LOW (ref 0.7–4.0)
MCH: 30.6 pg (ref 26.0–34.0)
MCHC: 33.9 g/dL (ref 30.0–36.0)
MCV: 90.2 fL (ref 80.0–100.0)
Monocytes Absolute: 1 10*3/uL (ref 0.1–1.0)
Monocytes Relative: 5 %
Neutro Abs: 19.3 10*3/uL — ABNORMAL HIGH (ref 1.7–7.7)
Neutrophils Relative %: 91 %
Platelet Count: 23 10*3/uL — ABNORMAL LOW (ref 150–400)
RBC: 4.48 MIL/uL (ref 3.87–5.11)
RDW: 12 % (ref 11.5–15.5)
WBC Count: 21 10*3/uL — ABNORMAL HIGH (ref 4.0–10.5)
nRBC: 0 % (ref 0.0–0.2)

## 2022-09-10 MED ORDER — IMMUNE GLOBULIN (HUMAN) 20 GM/200ML IV SOLN
1.0000 g/kg | Freq: Once | INTRAVENOUS | Status: AC
  Administered 2022-09-10: 80 g via INTRAVENOUS
  Filled 2022-09-10: qty 800

## 2022-09-10 MED ORDER — DIPHENHYDRAMINE HCL 25 MG PO CAPS
25.0000 mg | ORAL_CAPSULE | Freq: Once | ORAL | Status: AC
  Administered 2022-09-10: 25 mg via ORAL
  Filled 2022-09-10: qty 1

## 2022-09-10 MED ORDER — DEXTROSE 5 % IV SOLN: Freq: Once | INTRAVENOUS | Status: AC

## 2022-09-10 MED ORDER — ACETAMINOPHEN 325 MG PO TABS
650.0000 mg | ORAL_TABLET | Freq: Once | ORAL | Status: AC
  Administered 2022-09-10: 650 mg via ORAL
  Filled 2022-09-10: qty 2

## 2022-09-10 NOTE — Patient Instructions (Signed)

## 2022-09-11 ENCOUNTER — Inpatient Hospital Stay: Payer: Medicare PPO

## 2022-09-11 VITALS — BP 174/71 | HR 53 | Temp 98.5°F | Resp 20

## 2022-09-11 DIAGNOSIS — Z87891 Personal history of nicotine dependence: Secondary | ICD-10-CM | POA: Diagnosis not present

## 2022-09-11 DIAGNOSIS — D693 Immune thrombocytopenic purpura: Secondary | ICD-10-CM

## 2022-09-11 MED ORDER — ACETAMINOPHEN 325 MG PO TABS: 650.0000 mg | ORAL_TABLET | Freq: Once | ORAL | Status: DC

## 2022-09-11 MED ORDER — IMMUNE GLOBULIN (HUMAN) 20 GM/200ML IV SOLN
80.0000 g | Freq: Once | INTRAVENOUS | Status: AC
  Administered 2022-09-11: 80 g via INTRAVENOUS
  Filled 2022-09-11: qty 800

## 2022-09-11 MED ORDER — DIPHENHYDRAMINE HCL 25 MG PO CAPS: 25.0000 mg | ORAL_CAPSULE | Freq: Once | ORAL | Status: DC

## 2022-09-11 MED ORDER — DEXTROSE 5 % IV SOLN: Freq: Once | INTRAVENOUS | Status: AC

## 2022-09-11 NOTE — Patient Instructions (Signed)

## 2022-09-12 ENCOUNTER — Encounter: Payer: Self-pay | Admitting: Family

## 2022-09-12 ENCOUNTER — Ambulatory Visit (HOSPITAL_BASED_OUTPATIENT_CLINIC_OR_DEPARTMENT_OTHER)
Admission: RE | Admit: 2022-09-12 | Discharge: 2022-09-12 | Disposition: A | Discharge status: Home / Self Care | Payer: Medicare PPO | Source: Ambulatory Visit | Attending: Family | Admitting: Family

## 2022-09-12 ENCOUNTER — Encounter (HOSPITAL_BASED_OUTPATIENT_CLINIC_OR_DEPARTMENT_OTHER): Payer: Self-pay

## 2022-09-12 DIAGNOSIS — D693 Immune thrombocytopenic purpura: Secondary | ICD-10-CM | POA: Insufficient documentation

## 2022-09-12 DIAGNOSIS — K8020 Calculus of gallbladder without cholecystitis without obstruction: Secondary | ICD-10-CM | POA: Diagnosis not present

## 2022-09-12 DIAGNOSIS — K7689 Other specified diseases of liver: Secondary | ICD-10-CM | POA: Diagnosis not present

## 2022-09-12 DIAGNOSIS — K5730 Diverticulosis of large intestine without perforation or abscess without bleeding: Secondary | ICD-10-CM | POA: Diagnosis not present

## 2022-09-12 DIAGNOSIS — D696 Thrombocytopenia, unspecified: Secondary | ICD-10-CM | POA: Diagnosis not present

## 2022-09-12 MED ORDER — IOHEXOL 300 MG/ML  SOLN
100.0000 mL | Freq: Once | INTRAMUSCULAR | Status: AC | PRN
  Administered 2022-09-12: 100 mL via INTRAVENOUS

## 2022-09-13 LAB — PROTEIN ELECTROPHORESIS, SERUM
A/G Ratio: 1.7 (ref 0.7–1.7)
Albumin ELP: 4.1 g/dL (ref 2.9–4.4)
Alpha-1-Globulin: 0.3 g/dL (ref 0.0–0.4)
Alpha-2-Globulin: 0.6 g/dL (ref 0.4–1.0)
Beta Globulin: 1 g/dL (ref 0.7–1.3)
Gamma Globulin: 0.5 g/dL (ref 0.4–1.8)
Globulin, Total: 2.4 g/dL (ref 2.2–3.9)
Total Protein ELP: 6.5 g/dL (ref 6.0–8.5)

## 2022-09-17 ENCOUNTER — Encounter (HOSPITAL_BASED_OUTPATIENT_CLINIC_OR_DEPARTMENT_OTHER): Payer: Self-pay | Admitting: Obstetrics & Gynecology

## 2022-09-18 ENCOUNTER — Encounter (HOSPITAL_BASED_OUTPATIENT_CLINIC_OR_DEPARTMENT_OTHER): Payer: Self-pay | Admitting: Obstetrics & Gynecology

## 2022-09-18 DIAGNOSIS — R2689 Other abnormalities of gait and mobility: Secondary | ICD-10-CM | POA: Diagnosis not present

## 2022-09-19 ENCOUNTER — Inpatient Hospital Stay: Payer: Medicare PPO

## 2022-09-19 ENCOUNTER — Inpatient Hospital Stay: Payer: Medicare PPO | Admitting: Family

## 2022-09-19 ENCOUNTER — Ambulatory Visit: Payer: Medicare PPO | Admitting: Family

## 2022-09-19 ENCOUNTER — Encounter: Payer: Self-pay | Admitting: Family

## 2022-09-21 ENCOUNTER — Inpatient Hospital Stay: Payer: Medicare PPO | Admitting: Family

## 2022-09-21 ENCOUNTER — Inpatient Hospital Stay: Payer: Medicare PPO

## 2022-09-21 DIAGNOSIS — Z87891 Personal history of nicotine dependence: Secondary | ICD-10-CM | POA: Diagnosis not present

## 2022-09-21 DIAGNOSIS — D693 Immune thrombocytopenic purpura: Secondary | ICD-10-CM

## 2022-09-21 LAB — CBC WITH DIFFERENTIAL (CANCER CENTER ONLY)
Abs Immature Granulocytes: 0.04 10*3/uL (ref 0.00–0.07)
Basophils Absolute: 0.1 10*3/uL (ref 0.0–0.1)
Basophils Relative: 1 %
Eosinophils Absolute: 0.1 10*3/uL (ref 0.0–0.5)
Eosinophils Relative: 1 %
HCT: 40 % (ref 36.0–46.0)
Hemoglobin: 13.5 g/dL (ref 12.0–15.0)
Immature Granulocytes: 0 %
Lymphocytes Relative: 13 %
Lymphs Abs: 1.3 10*3/uL (ref 0.7–4.0)
MCH: 30.5 pg (ref 26.0–34.0)
MCHC: 33.8 g/dL (ref 30.0–36.0)
MCV: 90.3 fL (ref 80.0–100.0)
Monocytes Absolute: 1.1 10*3/uL — ABNORMAL HIGH (ref 0.1–1.0)
Monocytes Relative: 10 %
Neutro Abs: 7.9 10*3/uL — ABNORMAL HIGH (ref 1.7–7.7)
Neutrophils Relative %: 75 %
Platelet Count: 164 10*3/uL (ref 150–400)
RBC: 4.43 MIL/uL (ref 3.87–5.11)
RDW: 12 % (ref 11.5–15.5)
WBC Count: 10.6 10*3/uL — ABNORMAL HIGH (ref 4.0–10.5)
nRBC: 0 % (ref 0.0–0.2)

## 2022-09-21 LAB — SAVE SMEAR(SSMR), FOR PROVIDER SLIDE REVIEW

## 2022-09-21 LAB — CMP (CANCER CENTER ONLY)
ALT: <5 U/L (ref 0–44)
AST: 14 U/L — ABNORMAL LOW (ref 15–41)
Albumin: 4 g/dL (ref 3.5–5.0)
Alkaline Phosphatase: 53 U/L (ref 38–126)
Anion gap: 7 (ref 5–15)
BUN: 15 mg/dL (ref 8–23)
CO2: 28 mmol/L (ref 22–32)
Calcium: 9.7 mg/dL (ref 8.9–10.3)
Chloride: 102 mmol/L (ref 98–111)
Creatinine: 0.97 mg/dL (ref 0.44–1.00)
GFR, Estimated: >60 mL/min (ref >60)
Glucose, Bld: 86 mg/dL (ref 70–99)
Potassium: 4.8 mmol/L (ref 3.5–5.1)
Sodium: 137 mmol/L (ref 135–145)
Total Bilirubin: 0.9 mg/dL (ref 0.3–1.2)
Total Protein: 8 g/dL (ref 6.5–8.1)

## 2022-09-21 LAB — LACTATE DEHYDROGENASE: LDH: 156 U/L (ref 98–192)

## 2022-09-21 NOTE — Progress Notes (Signed)
Hematology and Oncology Follow Up Visit  Dominique WHITSETT [id number] 1950-11-12 72 y.o. 09/21/2022   Principle Diagnosis:  ITP  Past Therapy: Decadron 40 mg PO daily x 4 days - completed 09/09/2022 IVIG for 2 doses - given on 3/4 and 09/11/2022  Current Therapy:   Observation   Interim History:  Dominique Griffin is here today with her husband for follow-up. She has responded nicely to the initial treatment with Decadron followed by 2 doses of IVIG.  Platelet count at this time is up to 164, WBC count 10.6, and Hgb 13.5, MCV 90.  She has not noted any blood loss. Petechiae have resolved. No new bruising.  She is having some lightheadedness at times. She has a follow-up next week with [facility] for MRI as well as follow-ups with ENT and audiology.  No fever, chills, n/v, cough, rash, SOB, chest pain, palpitations, abdominal pain or changes in bowel or bladder habits.  No swelling, tenderness, numbness or tingling in her extremities at this time.  No falls or syncope.  Appetite and hydration are good. Weight is stable at 175 lbs.   ECOG Performance Status: 1 - Symptomatic but completely ambulatory  Medications:  Allergies as of 09/21/2022   No Known Allergies      Medication List        Accurate as of September 21, 2022  3:40 PM. If you have any questions, ask your nurse or doctor.          AMBULATORY NON FORMULARY MEDICATION Medication Name: Dopamine agonist (on clinical trial)   calcium carbonate 500 MG chewable tablet Commonly known as: TUMS - dosed in mg elemental calcium Chew 1 tablet by mouth daily.   camphor-menthol lotion Commonly known as: SARNA Apply 1 Application topically as needed for itching.   carbidopa-levodopa 25-100 MG tablet Commonly known as: SINEMET IR Take 1 tablet by mouth 3 (three) times daily.   Cholecalciferol 50 MCG (2000 UT) Tabs Take by mouth.   dexamethasone 4 MG tablet Commonly known as: DECADRON Take 10 tablets (40 mg total) by mouth daily.    fluconazole 100 MG tablet Commonly known as: DIFLUCAN Take 1 tablet (100 mg total) by mouth daily.   ibuprofen 200 MG tablet Commonly known as: ADVIL Take by mouth.   meclizine 12.5 MG tablet Commonly known as: ANTIVERT Take 12.5 mg by mouth 3 (three) times daily as needed for dizziness.   MULTIVITAMIN ADULT PO Take by mouth.   pantoprazole 40 MG tablet Commonly known as: Protonix Take 1 tablet (40 mg total) by mouth 2 (two) times daily.   pseudoephedrine 120 MG 12 hr tablet Commonly known as: SUDAFED Take 120 mg by mouth 2 (two) times daily.        Allergies: No Known Allergies  Past Medical History, Surgical history, Social history, and Family History were reviewed and updated.  Review of Systems: All other 10 point review of systems is negative.   Physical Exam:  vitals were not taken for this visit.   Wt Readings from Last 3 Encounters:  09/07/22 179 lb (81.2 kg)  08/29/22 179 lb 12.8 oz (81.6 kg)  10/02/21 197 lb (89.4 kg)    Ocular: Sclerae unicteric, pupils equal, round and reactive to light Ear-nose-throat: Oropharynx clear, dentition fair Lymphatic: No cervical or supraclavicular adenopathy Lungs no rales or rhonchi, good excursion bilaterally Heart regular rate and rhythm, no murmur appreciated Abd soft, nontender, positive bowel sounds MSK no focal spinal tenderness, no joint edema Neuro: non-focal, well-oriented, appropriate  affect Breasts: Deferred   Lab Results  Component Value Date   WBC 10.6 (H) 09/21/2022   HGB 13.5 09/21/2022   HCT 40.0 09/21/2022   MCV 90.3 09/21/2022   PLT 164 09/21/2022   Lab Results  Component Value Date   FERRITIN 41 09/07/2022   IRON 120 09/07/2022   TIBC 396 09/07/2022   UIBC 276 09/07/2022   IRONPCTSAT 30 09/07/2022   Lab Results  Component Value Date   RETICCTPCT 1.5 09/07/2022   RBC 4.43 09/21/2022   No results found for: "KPAFRELGTCHN", "LAMBDASER", "KAPLAMBRATIO" No results found for:  "IGGSERUM", "IGA", "IGMSERUM" Lab Results  Component Value Date   TOTALPROTELP 6.5 09/07/2022   ALBUMINELP 4.1 09/07/2022   A1GS 0.3 09/07/2022   A2GS 0.6 09/07/2022   BETS 1.0 09/07/2022   GAMS 0.5 09/07/2022   MSPIKE Not Observed 09/07/2022   SPEI Comment 09/07/2022     Chemistry      Component Value Date/Time   NA 137 09/21/2022 1405   K 4.8 09/21/2022 1405   CL 102 09/21/2022 1405   CO2 28 09/21/2022 1405   BUN 15 09/21/2022 1405   CREATININE 0.97 09/21/2022 1405      Component Value Date/Time   CALCIUM 9.7 09/21/2022 1405   ALKPHOS 53 09/21/2022 1405   AST 14 (L) 09/21/2022 1405   ALT <5 09/21/2022 1405   BILITOT 0.9 09/21/2022 1405       Impression and Plan: 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 So far she has responded nicely to treatment. Platelets are 164. We will continue to follow along with her closely.  Follow-up in 2 weeks.   Lottie Dawson, NP 3/15/20243:40 PM

## 2022-09-26 DIAGNOSIS — R42 Dizziness and giddiness: Secondary | ICD-10-CM | POA: Diagnosis not present

## 2022-10-02 DIAGNOSIS — H903 Sensorineural hearing loss, bilateral: Secondary | ICD-10-CM | POA: Diagnosis not present

## 2022-10-02 DIAGNOSIS — R42 Dizziness and giddiness: Secondary | ICD-10-CM | POA: Diagnosis not present

## 2022-10-02 DIAGNOSIS — R2689 Other abnormalities of gait and mobility: Secondary | ICD-10-CM | POA: Diagnosis not present

## 2022-10-08 ENCOUNTER — Encounter: Payer: Self-pay | Admitting: Family

## 2022-10-08 ENCOUNTER — Inpatient Hospital Stay: Payer: Medicare PPO | Attending: Hematology & Oncology

## 2022-10-08 ENCOUNTER — Inpatient Hospital Stay: Payer: Medicare PPO | Admitting: Family

## 2022-10-08 ENCOUNTER — Other Ambulatory Visit: Payer: Self-pay

## 2022-10-08 VITALS — BP 128/71 | HR 76 | Temp 98.3°F | Resp 18 | Ht 66.0 in | Wt 173.0 lb

## 2022-10-08 DIAGNOSIS — D693 Immune thrombocytopenic purpura: Secondary | ICD-10-CM | POA: Insufficient documentation

## 2022-10-08 LAB — CBC WITH DIFFERENTIAL (CANCER CENTER ONLY)
Abs Immature Granulocytes: 0.03 10*3/uL (ref 0.00–0.07)
Basophils Absolute: 0.1 10*3/uL (ref 0.0–0.1)
Basophils Relative: 1 %
Eosinophils Absolute: 0.1 10*3/uL (ref 0.0–0.5)
Eosinophils Relative: 2 %
HCT: 38.8 % (ref 36.0–46.0)
Hemoglobin: 13.2 g/dL (ref 12.0–15.0)
Immature Granulocytes: 1 %
Lymphocytes Relative: 16 %
Lymphs Abs: 1 10*3/uL (ref 0.7–4.0)
MCH: 30.3 pg (ref 26.0–34.0)
MCHC: 34 g/dL (ref 30.0–36.0)
MCV: 89 fL (ref 80.0–100.0)
Monocytes Absolute: 0.8 10*3/uL (ref 0.1–1.0)
Monocytes Relative: 13 %
Neutro Abs: 4.2 10*3/uL (ref 1.7–7.7)
Neutrophils Relative %: 67 %
Platelet Count: 283 10*3/uL (ref 150–400)
RBC: 4.36 MIL/uL (ref 3.87–5.11)
RDW: 12 % (ref 11.5–15.5)
WBC Count: 6.3 10*3/uL (ref 4.0–10.5)
nRBC: 0 % (ref 0.0–0.2)

## 2022-10-08 LAB — CMP (CANCER CENTER ONLY)
ALT: <5 U/L (ref 0–44)
AST: 17 U/L (ref 15–41)
Albumin: 4.3 g/dL (ref 3.5–5.0)
Alkaline Phosphatase: 55 U/L (ref 38–126)
Anion gap: 7 (ref 5–15)
BUN: 10 mg/dL (ref 8–23)
CO2: 27 mmol/L (ref 22–32)
Calcium: 9.3 mg/dL (ref 8.9–10.3)
Chloride: 101 mmol/L (ref 98–111)
Creatinine: 0.79 mg/dL (ref 0.44–1.00)
GFR, Estimated: >60 mL/min (ref >60)
Glucose, Bld: 94 mg/dL (ref 70–99)
Potassium: 4 mmol/L (ref 3.5–5.1)
Sodium: 135 mmol/L (ref 135–145)
Total Bilirubin: 0.7 mg/dL (ref 0.3–1.2)
Total Protein: 6.9 g/dL (ref 6.5–8.1)

## 2022-10-08 LAB — SAVE SMEAR(SSMR), FOR PROVIDER SLIDE REVIEW

## 2022-10-08 LAB — LACTATE DEHYDROGENASE: LDH: 155 U/L (ref 98–192)

## 2022-10-08 NOTE — Progress Notes (Signed)
Hematology and Oncology Follow Up Visit  Dominique CHORNEY [id number] [birth year] 72 y.o. 10/08/2022   Principle Diagnosis:  ITP   Past Therapy: Decadron 40 mg PO daily x 4 days - completed 09/09/2022 IVIG for 2 doses - given on 3/4 and 09/11/2022   Current Therapy:        Observation   Interim History:  Dominique Griffin is here today with her husband for follow-up. She is doing quite well and has no complaints at this time.  Platelets are now up to 283!!! She remains asymptomatic.  No issues with bleeding, bruising or petechiae.  No fever, chills, n/v, cough, rash, SOB, chest pain, palpitations, abdominal pain or changes in bowel or bladder habits.  Lightheadedness I much improved since seeing ENT and starting vestibular exercises.  No swelling, tenderness, numbness or tingling.  No falls or syncope.  Appetite and hydration are good. Weight is stable at 173 lbs.   ECOG Performance Status: 0 - Asymptomatic  Medications:  Allergies as of 10/08/2022   No Known Allergies      Medication List        Accurate as of October 08, 2022  1:18 PM. If you have any questions, ask your nurse or doctor.          STOP taking these medications    AMBULATORY NON FORMULARY MEDICATION Stopped by: Lottie Dawson, NP   dexamethasone 4 MG tablet Commonly known as: DECADRON Stopped by: Lottie Dawson, NP   fluconazole 100 MG tablet Commonly known as: DIFLUCAN Stopped by: Lottie Dawson, NP   pantoprazole 40 MG tablet Commonly known as: Protonix Stopped by: Lottie Dawson, NP       TAKE these medications    calcium carbonate 500 MG chewable tablet Commonly known as: TUMS - dosed in mg elemental calcium Chew 1 tablet by mouth daily.   camphor-menthol lotion Commonly known as: SARNA Apply 1 Application topically as needed for itching.   carbidopa-levodopa 25-100 MG tablet Commonly known as: SINEMET IR Take 1 tablet by mouth 3 (three) times daily.   Cholecalciferol 50 MCG (2000 UT) Tabs Take  by mouth.   ibuprofen 200 MG tablet Commonly known as: ADVIL Take by mouth.   meclizine 12.5 MG tablet Commonly known as: ANTIVERT Take 12.5 mg by mouth 3 (three) times daily as needed for dizziness.   MULTIVITAMIN ADULT PO Take by mouth.   pseudoephedrine 120 MG 12 hr tablet Commonly known as: SUDAFED Take 120 mg by mouth 2 (two) times daily.        Allergies: No Known Allergies  Past Medical History, Surgical history, Social history, and Family History were reviewed and updated.  Review of Systems: All other 10 point review of systems is negative.   Physical Exam:  height is 5\' 6"  (1.676 m) and weight is 173 lb 0.6 oz (78.5 kg). Her oral temperature is 98.3 F (36.8 C). Her blood pressure is 128/71 and her pulse is 76. Her respiration is 18 and oxygen saturation is 100%.   Wt Readings from Last 3 Encounters:  10/08/22 173 lb 0.6 oz (78.5 kg)  09/07/22 179 lb (81.2 kg)  08/29/22 179 lb 12.8 oz (81.6 kg)    Ocular: Sclerae unicteric, pupils equal, round and reactive to light Ear-nose-throat: Oropharynx clear, dentition fair Lymphatic: No cervical or supraclavicular adenopathy Lungs no rales or rhonchi, good excursion bilaterally Heart regular rate and rhythm, no murmur appreciated Abd soft, nontender, positive bowel sounds MSK no focal spinal tenderness, no joint edema Neuro:  non-focal, well-oriented, appropriate affect Breasts: Deferred   Lab Results  Component Value Date   WBC 6.3 10/08/2022   HGB 13.2 10/08/2022   HCT 38.8 10/08/2022   MCV 89.0 10/08/2022   PLT 283 10/08/2022   Lab Results  Component Value Date   FERRITIN 41 09/07/2022   IRON 120 09/07/2022   TIBC 396 09/07/2022   UIBC 276 09/07/2022   IRONPCTSAT 30 09/07/2022   Lab Results  Component Value Date   RETICCTPCT 1.5 09/07/2022   RBC 4.36 10/08/2022   No results found for: "KPAFRELGTCHN", "LAMBDASER", "KAPLAMBRATIO" No results found for: "IGGSERUM", "IGA", "IGMSERUM" Lab Results   Component Value Date   TOTALPROTELP 6.5 09/07/2022   ALBUMINELP 4.1 09/07/2022   A1GS 0.3 09/07/2022   A2GS 0.6 09/07/2022   BETS 1.0 09/07/2022   GAMS 0.5 09/07/2022   MSPIKE Not Observed 09/07/2022   SPEI Comment 09/07/2022     Chemistry      Component Value Date/Time   NA 137 09/21/2022 1405   K 4.8 09/21/2022 1405   CL 102 09/21/2022 1405   CO2 28 09/21/2022 1405   BUN 15 09/21/2022 1405   CREATININE 0.97 09/21/2022 1405      Component Value Date/Time   CALCIUM 9.7 09/21/2022 1405   ALKPHOS 53 09/21/2022 1405   AST 14 (L) 09/21/2022 1405   ALT <5 09/21/2022 1405   BILITOT 0.9 09/21/2022 1405       Impression and Plan: Dominique Griffin is a pleasant 72 yo caucasian 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 So far she has responded nicely to treatment. Platelets are 283! We will continue to follow along with her closely.  Follow-up in 6 weeks.   Lottie Dawson, NP 4/1/20241:18 PM

## 2022-10-09 ENCOUNTER — Encounter: Payer: Self-pay | Admitting: Family

## 2022-10-09 ENCOUNTER — Encounter: Payer: Medicare PPO | Admitting: Internal Medicine

## 2022-10-18 ENCOUNTER — Encounter (HOSPITAL_BASED_OUTPATIENT_CLINIC_OR_DEPARTMENT_OTHER): Payer: Self-pay | Admitting: Obstetrics & Gynecology

## 2022-10-23 ENCOUNTER — Encounter: Payer: Self-pay | Admitting: Internal Medicine

## 2022-10-23 DIAGNOSIS — R739 Hyperglycemia, unspecified: Secondary | ICD-10-CM | POA: Insufficient documentation

## 2022-10-23 NOTE — Patient Instructions (Addendum)
Medications changes include :   none      Return in about 1 year (around 10/24/2023) for Physical Exam.   Health Maintenance, Female Adopting a healthy lifestyle and getting preventive care are important in promoting health and wellness. Ask your health care provider about: The right schedule for you to have regular tests and exams. Things you can do on your own to prevent diseases and keep yourself healthy. What should I know about diet, weight, and exercise? Eat a healthy diet  Eat a diet that includes plenty of vegetables, fruits, low-fat dairy products, and lean protein. Do not eat a lot of foods that are high in solid fats, added sugars, or sodium. Maintain a healthy weight Body mass index (BMI) is used to identify weight problems. It estimates body fat based on height and weight. Your health care provider can help determine your BMI and help you achieve or maintain a healthy weight. Get regular exercise Get regular exercise. This is one of the most important things you can do for your health. Most adults should: Exercise for at least 150 minutes each week. The exercise should increase your heart rate and make you sweat (moderate-intensity exercise). Do strengthening exercises at least twice a week. This is in addition to the moderate-intensity exercise. Spend less time sitting. Even light physical activity can be beneficial. Watch cholesterol and blood lipids Have your blood tested for lipids and cholesterol at 71 years of age, then have this test every 5 years. Have your cholesterol levels checked more often if: Your lipid or cholesterol levels are high. You are older than 71 years of age. You are at high risk for heart disease. What should I know about cancer screening? Depending on your health history and family history, you may need to have cancer screening at various ages. This may include screening for: Breast cancer. Cervical cancer. Colorectal cancer. Skin  cancer. Lung cancer. What should I know about heart disease, diabetes, and high blood pressure? Blood pressure and heart disease High blood pressure causes heart disease and increases the risk of stroke. This is more likely to develop in people who have high blood pressure readings or are overweight. Have your blood pressure checked: Every 3-5 years if you are 49-38 years of age. Every year if you are 28 years old or older. Diabetes Have regular diabetes screenings. This checks your fasting blood sugar level. Have the screening done: Once every three years after age 15 if you are at a normal weight and have a low risk for diabetes. More often and at a younger age if you are overweight or have a high risk for diabetes. What should I know about preventing infection? Hepatitis B If you have a higher risk for hepatitis B, you should be screened for this virus. Talk with your health care provider to find out if you are at risk for hepatitis B infection. Hepatitis C Testing is recommended for: Everyone born from [birth year] through 1965. Anyone with known risk factors for hepatitis C. Sexually transmitted infections (STIs) Get screened for STIs, including gonorrhea and chlamydia, if: You are sexually active and are younger than 71 years of age. You are older than 71 years of age and your health care provider tells you that you are at risk for this type of infection. Your sexual activity has changed since you were last screened, and you are at increased risk for chlamydia or gonorrhea. Ask your health care provider if you are at risk.  Ask your health care provider about whether you are at high risk for HIV. Your health care provider may recommend a prescription medicine to help prevent HIV infection. If you choose to take medicine to prevent HIV, you should first get tested for HIV. You should then be tested every 3 months for as long as you are taking the medicine. Pregnancy If you are about to stop  having your period (premenopausal) and you may become pregnant, seek counseling before you get pregnant. Take 400 to 800 micrograms (mcg) of folic acid every day if you become pregnant. Ask for birth control (contraception) if you want to prevent pregnancy. Osteoporosis and menopause Osteoporosis is a disease in which the bones lose minerals and strength with aging. This can result in bone fractures. If you are 40 years old or older, or if you are at risk for osteoporosis and fractures, ask your health care provider if you should: Be screened for bone loss. Take a calcium or vitamin D supplement to lower your risk of fractures. Be given hormone replacement therapy (HRT) to treat symptoms of menopause. Follow these instructions at home: Alcohol use Do not drink alcohol if: Your health care provider tells you not to drink. You are pregnant, may be pregnant, or are planning to become pregnant. If you drink alcohol: Limit how much you have to: 0-1 drink a day. Know how much alcohol is in your drink. In the [place] one drink equals one 12 oz bottle of beer (355 mL), one 5 oz glass of wine (148 mL), or one 1 oz glass of hard liquor (44 mL). Lifestyle Do not use any products that contain nicotine or tobacco. These products include cigarettes, chewing tobacco, and vaping devices, such as e-cigarettes. If you need help quitting, ask your health care provider. Do not use street drugs. Do not share needles. Ask your health care provider for help if you need support or information about quitting drugs. General instructions Schedule regular health, dental, and eye exams. Stay current with your vaccines. Tell your health care provider if: You often feel depressed. You have ever been abused or do not feel safe at home. Summary Adopting a healthy lifestyle and getting preventive care are important in promoting health and wellness. Follow your health care provider's instructions about healthy diet,  exercising, and getting tested or screened for diseases. Follow your health care provider's instructions on monitoring your cholesterol and blood pressure. This information is not intended to replace advice given to you by your health care provider. Make sure you discuss any questions you have with your health care provider. Document Revised: 11/14/2020 Document Reviewed: 11/14/2020 Elsevier Patient Education  2023 [employer].

## 2022-10-23 NOTE — Progress Notes (Unsigned)
Subjective:    Patient ID: Dominique Griffin, female    DOB: Nov 20, 1950, 72 y.o.   MRN: [id number]      HPI Dominique Griffin is here for a Physical exam and her chronic medical problems.    PT evaluation in May.   Saw Dr Hyacinth Meeker  Dx w/ PPPD   Recent dx ITP - seeing onc - notes in chart   Medications and allergies reviewed with patient and updated if appropriate.  Current Outpatient Medications on File Prior to Visit  Medication Sig Dispense Refill   calcium carbonate (TUMS - DOSED IN MG ELEMENTAL CALCIUM) 500 MG chewable tablet Chew 1 tablet by mouth daily.     camphor-menthol (SARNA) lotion Apply 1 Application topically as needed for itching.     Carbidopa-Levodopa ER (SINEMET CR) 25-100 MG tablet controlled release Take 1 tablet by mouth 3 (three) times daily.     Cholecalciferol 50 MCG (2000 UT) TABS Take by mouth.     ibuprofen (ADVIL) 200 MG tablet Take by mouth.     Multiple Vitamin (MULTIVITAMIN ADULT PO) Take by mouth.     pseudoephedrine (SUDAFED) 120 MG 12 hr tablet Take 120 mg by mouth 2 (two) times daily.     No current facility-administered medications on file prior to visit.    Review of Systems  Constitutional:  Negative for fever.  Eyes:  Negative for visual disturbance.  Respiratory:  Negative for cough, shortness of breath and wheezing.   Cardiovascular:  Negative for chest pain, palpitations and leg swelling.  Gastrointestinal:  Negative for abdominal pain, blood in stool, constipation and diarrhea.       No gerd  Genitourinary:  Negative for dysuria.  Musculoskeletal:  Positive for back pain (lower back at times). Negative for arthralgias.  Skin:  Negative for rash.  Neurological:  Positive for light-headedness. Negative for headaches.  Psychiatric/Behavioral:  Negative for dysphoric mood. The patient is not nervous/anxious.        Objective:   Vitals:   10/24/22 1257  BP: 120/80  Pulse: 75  Temp: 98.2 F (36.8 C)  SpO2: 98%   Filed Weights    10/24/22 1257  Weight: 171 lb (77.6 kg)   Body mass index is 27.6 kg/m.  BP Readings from Last 3 Encounters:  10/24/22 120/80  10/08/22 128/71  09/11/22 (!) 174/71    Wt Readings from Last 3 Encounters:  10/24/22 171 lb (77.6 kg)  10/08/22 173 lb 0.6 oz (78.5 kg)  09/07/22 179 lb (81.2 kg)       Physical Exam Constitutional: She appears well-developed and well-nourished. No distress.  HENT:  Head: Normocephalic and atraumatic.  Right Ear: External ear normal. Normal ear canal and TM Left Ear: External ear normal.  Normal ear canal and TM Mouth/Throat: Oropharynx is clear and moist.  Eyes: Conjunctivae normal.  Neck: Neck supple. No tracheal deviation present. No thyromegaly present.  No carotid bruit  Cardiovascular: Normal rate, regular rhythm and normal heart sounds.   No murmur heard.  No edema. Pulmonary/Chest: Effort normal and breath sounds normal. No respiratory distress. She has no wheezes. She has no rales.  Breast: deferred   Abdominal: Soft. She exhibits no distension. There is no tenderness.  Lymphadenopathy: She has no cervical adenopathy.  Skin: Skin is warm and dry. She is not diaphoretic.  Psychiatric: She has a normal mood and affect. Her behavior is normal.     Lab Results  Component Value Date   WBC 6.3 10/08/2022  HGB 13.2 10/08/2022   HCT 38.8 10/08/2022   PLT 283 10/08/2022   GLUCOSE 94 10/08/2022   CHOL 180 10/02/2021   TRIG 62.0 10/02/2021   HDL 62.50 10/02/2021   LDLCALC 105 (H) 10/02/2021   ALT <5 10/08/2022   AST 17 10/08/2022   NA 135 10/08/2022   K 4.0 10/08/2022   CL 101 10/08/2022   CREATININE 0.79 10/08/2022   BUN 10 10/08/2022   CO2 27 10/08/2022   TSH 2.38 10/02/2021   INR 1.0 08/29/2022         Assessment & Plan:   Physical exam: Screening blood work  ordered Exercise  regular - boxing, weights, lifts weights, power moves, cycles, stretching Weight  - goof for age Substance abuse  none   Reviewed  recommended immunizations.   Health Maintenance  Topic Date Due   DTaP/Tdap/Td (2 - Td or Tdap) 01/23/2018   COVID-19 Vaccine (8 - 2023-24 season) 11/09/2022 (Originally 05/29/2022)   INFLUENZA VACCINE  02/07/2023   Medicare Annual Wellness (AWV)  04/19/2023   DEXA SCAN  11/11/2023   MAMMOGRAM  05/14/2024   COLONOSCOPY (Pts 45-16yrs Insurance coverage will need to be confirmed)  12/14/2024   Hepatitis C Screening  Completed   Zoster Vaccines- Shingrix  Completed   HPV VACCINES  Aged Out   Pneumonia Vaccine 45+ Years old  Discontinued          See Problem List for Assessment and Plan of chronic medical problems.

## 2022-10-24 ENCOUNTER — Encounter: Payer: Self-pay | Admitting: Internal Medicine

## 2022-10-24 ENCOUNTER — Ambulatory Visit (INDEPENDENT_AMBULATORY_CARE_PROVIDER_SITE_OTHER): Payer: Medicare PPO | Admitting: Internal Medicine

## 2022-10-24 VITALS — BP 120/80 | HR 75 | Temp 98.2°F | Ht 66.0 in | Wt 171.0 lb

## 2022-10-24 DIAGNOSIS — R42 Dizziness and giddiness: Secondary | ICD-10-CM | POA: Insufficient documentation

## 2022-10-24 DIAGNOSIS — Z0000 Encounter for general adult medical examination without abnormal findings: Secondary | ICD-10-CM

## 2022-10-24 DIAGNOSIS — D693 Immune thrombocytopenic purpura: Secondary | ICD-10-CM | POA: Diagnosis not present

## 2022-10-24 DIAGNOSIS — E559 Vitamin D deficiency, unspecified: Secondary | ICD-10-CM | POA: Diagnosis not present

## 2022-10-24 DIAGNOSIS — R739 Hyperglycemia, unspecified: Secondary | ICD-10-CM

## 2022-10-24 DIAGNOSIS — G20A1 Parkinson's disease without dyskinesia, without mention of fluctuations: Secondary | ICD-10-CM | POA: Diagnosis not present

## 2022-10-24 DIAGNOSIS — M85852 Other specified disorders of bone density and structure, left thigh: Secondary | ICD-10-CM | POA: Diagnosis not present

## 2022-10-24 NOTE — Assessment & Plan Note (Addendum)
Started in Feb Plt 8 Dr Myna Hidalgo On steroids, PPI, anti-fungal Started IVIG Ct A/P Plt better

## 2022-10-24 NOTE — Assessment & Plan Note (Signed)
Chronic Continue taking vitamin d

## 2022-10-24 NOTE — Assessment & Plan Note (Signed)
Chronic Sugars have been ok Continue healthy diet, exercise

## 2022-10-24 NOTE — Assessment & Plan Note (Addendum)
Chronic Following with neurology at [facility]-Dr. Nedra Hai Exercising regularly was research study - new dopamine agonist through [facility] - stopped due to ? Side effect or at least to rule it out as causing side effects On sinemet 2 tabs tid - will start long acting sinemet and do IR

## 2022-10-24 NOTE — Assessment & Plan Note (Signed)
Chronic Dexa due next year Exercises regularly Continue calcium and vitamin d

## 2022-10-24 NOTE — Assessment & Plan Note (Addendum)
Subacute Lightheadedness/imbalance Follows with neuro Has seen ENT Meclizine not effective Dx PPPD - Persistent postural perceptual dizziness Doing vestibular exercises

## 2022-11-09 ENCOUNTER — Encounter: Payer: Self-pay | Admitting: Internal Medicine

## 2022-11-09 ENCOUNTER — Encounter: Payer: Self-pay | Admitting: Family

## 2022-11-15 ENCOUNTER — Ambulatory Visit: Payer: Medicare PPO | Admitting: Rehabilitative and Restorative Service Providers"

## 2022-11-16 ENCOUNTER — Other Ambulatory Visit: Payer: Self-pay

## 2022-11-16 NOTE — Addendum Note (Signed)
Addended by: Karma Ganja on: 11/16/2022 04:52 PM   Modules accepted: Orders

## 2022-11-19 ENCOUNTER — Inpatient Hospital Stay: Payer: Medicare PPO | Attending: Hematology & Oncology

## 2022-11-19 ENCOUNTER — Encounter: Payer: Self-pay | Admitting: Family

## 2022-11-19 ENCOUNTER — Inpatient Hospital Stay: Payer: Medicare PPO | Admitting: Family

## 2022-11-19 DIAGNOSIS — K219 Gastro-esophageal reflux disease without esophagitis: Secondary | ICD-10-CM | POA: Diagnosis not present

## 2022-11-19 DIAGNOSIS — D693 Immune thrombocytopenic purpura: Secondary | ICD-10-CM | POA: Insufficient documentation

## 2022-11-19 LAB — CMP (CANCER CENTER ONLY)
ALT: <5 U/L (ref 0–44)
AST: 12 U/L — ABNORMAL LOW (ref 15–41)
Albumin: 4.7 g/dL (ref 3.5–5.0)
Alkaline Phosphatase: 49 U/L (ref 38–126)
Anion gap: 7 (ref 5–15)
BUN: 8 mg/dL (ref 8–23)
CO2: 28 mmol/L (ref 22–32)
Calcium: 10.1 mg/dL (ref 8.9–10.3)
Chloride: 103 mmol/L (ref 98–111)
Creatinine: 0.78 mg/dL (ref 0.44–1.00)
GFR, Estimated: >60 mL/min (ref >60)
Glucose, Bld: 102 mg/dL — ABNORMAL HIGH (ref 70–99)
Potassium: 5.2 mmol/L — ABNORMAL HIGH (ref 3.5–5.1)
Sodium: 138 mmol/L (ref 135–145)
Total Bilirubin: 1.7 mg/dL — ABNORMAL HIGH (ref 0.3–1.2)
Total Protein: 6.8 g/dL (ref 6.5–8.1)

## 2022-11-19 LAB — CBC WITH DIFFERENTIAL (CANCER CENTER ONLY)
Abs Immature Granulocytes: 0.03 10*3/uL (ref 0.00–0.07)
Basophils Absolute: 0.1 10*3/uL (ref 0.0–0.1)
Basophils Relative: 1 %
Eosinophils Absolute: 0.1 10*3/uL (ref 0.0–0.5)
Eosinophils Relative: 1 %
HCT: 42.4 % (ref 36.0–46.0)
Hemoglobin: 14.2 g/dL (ref 12.0–15.0)
Immature Granulocytes: 0 %
Lymphocytes Relative: 20 %
Lymphs Abs: 1.4 10*3/uL (ref 0.7–4.0)
MCH: 30.3 pg (ref 26.0–34.0)
MCHC: 33.5 g/dL (ref 30.0–36.0)
MCV: 90.4 fL (ref 80.0–100.0)
Monocytes Absolute: 0.7 10*3/uL (ref 0.1–1.0)
Monocytes Relative: 10 %
Neutro Abs: 4.9 10*3/uL (ref 1.7–7.7)
Neutrophils Relative %: 68 %
Platelet Count: 20 10*3/uL — ABNORMAL LOW (ref 150–400)
RBC: 4.69 MIL/uL (ref 3.87–5.11)
RDW: 12.4 % (ref 11.5–15.5)
Smear Review: DECREASED
WBC Count: 7.2 10*3/uL (ref 4.0–10.5)
nRBC: 0 % (ref 0.0–0.2)

## 2022-11-19 LAB — LACTATE DEHYDROGENASE: LDH: 144 U/L (ref 98–192)

## 2022-11-19 LAB — SAVE SMEAR(SSMR), FOR PROVIDER SLIDE REVIEW

## 2022-11-19 MED ORDER — PREDNISONE 20 MG PO TABS
80.0000 mg | ORAL_TABLET | Freq: Every day | ORAL | 1 refills | Status: DC
Start: 2022-11-19 — End: 2023-03-06

## 2022-11-19 MED ORDER — PANTOPRAZOLE SODIUM 40 MG PO TBEC
40.0000 mg | DELAYED_RELEASE_TABLET | Freq: Two times a day (BID) | ORAL | 0 refills | Status: DC
Start: 2022-11-19 — End: 2022-11-30

## 2022-11-19 MED ORDER — FLUCONAZOLE 100 MG PO TABS
100.0000 mg | ORAL_TABLET | Freq: Every day | ORAL | 0 refills | Status: DC
Start: 2022-11-19 — End: 2022-11-26

## 2022-11-19 MED ORDER — FLUCONAZOLE 100 MG PO TABS
100.0000 mg | ORAL_TABLET | Freq: Every day | ORAL | 0 refills | Status: DC
Start: 2022-11-19 — End: 2022-11-19

## 2022-11-19 NOTE — Progress Notes (Signed)
Hematology and Oncology Follow Up Visit  Dominique Griffin [id number] 1951-01-06 72 y.o. 11/19/2022   Principle Diagnosis:  Decadron 40 mg PO daily x 4 days - completed 09/09/2022 IVIG for 2 doses - given on 3/4 and 09/11/2022   Current Therapy:        Observation   Interim History:  Dominique Griffin is here today for follow-up. She is doing well and has no complaints at this time.  Unfortunately, her platelets have dropped back down to 20. CBC with diff is otherwise unremarkable.  She has not noted any blood loss. No bruising or petechiae.  No fever, chills, n/v, cough, rash, dizziness, SOB, chest pain, palpitations, abdominal pain or changes in bowel or bladder habits.  No swelling, tenderness, numbness or tingling in her extremities.  No falls or syncope.  Appetite and hydration are good. Weight is stable at 173 lbs.   ECOG Performance Status: 1 - Symptomatic but completely ambulatory  Medications:  Allergies as of 11/19/2022   No Known Allergies      Medication List        Accurate as of Nov 19, 2022 12:52 PM. If you have any questions, ask your nurse or doctor.          calcium carbonate 500 MG chewable tablet Commonly known as: TUMS - dosed in mg elemental calcium Chew 1 tablet by mouth daily.   camphor-menthol lotion Commonly known as: SARNA Apply 1 Application topically as needed for itching.   CARBIDOPA-LEVODOPA PO Take 25 mg by mouth 3 (three) times daily. One tablet by mouth (25-100 mg) three times a day around meal times *May increase dose to two tablets three times daily if needed*   Carbidopa-Levodopa ER 25-100 MG tablet controlled release Commonly known as: SINEMET CR Take 1 tablet by mouth 3 (three) times daily.   Cholecalciferol 50 MCG (2000 UT) Tabs Take by mouth.   ibuprofen 200 MG tablet Commonly known as: ADVIL Take by mouth.   MULTIVITAMIN ADULT PO Take by mouth.   pseudoephedrine 120 MG 12 hr tablet Commonly known as: SUDAFED Take 120 mg by  mouth 2 (two) times daily.        Allergies: No Known Allergies  Past Medical History, Surgical history, Social history, and Family History were reviewed and updated.  Review of Systems: All other 10 point review of systems is negative.   Physical Exam:  vitals were not taken for this visit.   Wt Readings from Last 3 Encounters:  10/24/22 171 lb (77.6 kg)  10/08/22 173 lb 0.6 oz (78.5 kg)  09/07/22 179 lb (81.2 kg)    Ocular: Sclerae unicteric, pupils equal, round and reactive to light Ear-nose-throat: Oropharynx clear, dentition fair Lymphatic: No cervical or supraclavicular adenopathy Lungs no rales or rhonchi, good excursion bilaterally Heart regular rate and rhythm, no murmur appreciated Abd soft, nontender, positive bowel sounds MSK no focal spinal tenderness, no joint edema Neuro: non-focal, well-oriented, appropriate affect Breasts: Deferred  Lab Results  Component Value Date   WBC 6.3 10/08/2022   HGB 13.2 10/08/2022   HCT 38.8 10/08/2022   MCV 89.0 10/08/2022   PLT 283 10/08/2022   Lab Results  Component Value Date   FERRITIN 41 09/07/2022   IRON 120 09/07/2022   TIBC 396 09/07/2022   UIBC 276 09/07/2022   IRONPCTSAT 30 09/07/2022   Lab Results  Component Value Date   RETICCTPCT 1.5 09/07/2022   RBC 4.36 10/08/2022   No results found for: "KPAFRELGTCHN", "LAMBDASER", "KAPLAMBRATIO"  No results found for: "IGGSERUM", "IGA", "IGMSERUM" Lab Results  Component Value Date   TOTALPROTELP 6.5 09/07/2022   ALBUMINELP 4.1 09/07/2022   A1GS 0.3 09/07/2022   A2GS 0.6 09/07/2022   BETS 1.0 09/07/2022   GAMS 0.5 09/07/2022   MSPIKE Not Observed 09/07/2022   SPEI Comment 09/07/2022     Chemistry      Component Value Date/Time   NA 135 10/08/2022 1248   K 4.0 10/08/2022 1248   CL 101 10/08/2022 1248   CO2 27 10/08/2022 1248   BUN 10 10/08/2022 1248   CREATININE 0.79 10/08/2022 1248      Component Value Date/Time   CALCIUM 9.3 10/08/2022 1248    ALKPHOS 55 10/08/2022 1248   AST 17 10/08/2022 1248   ALT <5 10/08/2022 1248   BILITOT 0.7 10/08/2022 1248       Impression and Plan:  Dominique Griffin is a pleasant 72 yo caucasian 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  Unfortunately her platelets have dropped again to 20.  I discussed this at length with Dr. Myna Hidalgo and we will start her on Prednisone 80 mg PO daily, Protonix 40 mg PO daily and Diflucan 100 mg PO daily.  She will also get IVIG twice this week (Wed and Thurs).  Patient verbalized understanding and agreement with the plan.  Lab check next week on Monday and then weekly.  Follow-up with MD in 3 weeks.   Eileen Stanford, NP 5/13/202412:52 PM

## 2022-11-21 ENCOUNTER — Inpatient Hospital Stay: Payer: Medicare PPO

## 2022-11-21 VITALS — BP 155/67 | HR 78 | Temp 98.5°F | Resp 17

## 2022-11-21 DIAGNOSIS — D693 Immune thrombocytopenic purpura: Secondary | ICD-10-CM

## 2022-11-21 MED ORDER — DEXTROSE 5 % IV SOLN: INTRAVENOUS | Status: DC

## 2022-11-21 MED ORDER — IMMUNE GLOBULIN (HUMAN) 20 GM/200ML IV SOLN
80.0000 g | Freq: Once | INTRAVENOUS | Status: AC
  Administered 2022-11-21: 80 g via INTRAVENOUS
  Filled 2022-11-21: qty 800

## 2022-11-21 MED ORDER — ACETAMINOPHEN 325 MG PO TABS: 650.0000 mg | ORAL_TABLET | Freq: Once | ORAL | Status: DC

## 2022-11-21 MED ORDER — DIPHENHYDRAMINE HCL 25 MG PO CAPS: 25.0000 mg | ORAL_CAPSULE | Freq: Once | ORAL | Status: DC

## 2022-11-21 NOTE — Patient Instructions (Signed)

## 2022-11-21 NOTE — Progress Notes (Signed)
Patient requested to keep the IV until tomorrow's infusion.

## 2022-11-22 ENCOUNTER — Encounter: Payer: Self-pay | Admitting: Family

## 2022-11-22 ENCOUNTER — Inpatient Hospital Stay: Payer: Medicare PPO

## 2022-11-22 VITALS — BP 179/78 | HR 86 | Temp 98.0°F | Resp 18

## 2022-11-22 DIAGNOSIS — D693 Immune thrombocytopenic purpura: Secondary | ICD-10-CM | POA: Diagnosis not present

## 2022-11-22 MED ORDER — DEXTROSE 5 % IV SOLN: INTRAVENOUS | Status: DC

## 2022-11-22 MED ORDER — DIPHENHYDRAMINE HCL 25 MG PO CAPS: 25.0000 mg | ORAL_CAPSULE | Freq: Once | ORAL | Status: DC

## 2022-11-22 MED ORDER — ACETAMINOPHEN 325 MG PO TABS: 650.0000 mg | ORAL_TABLET | Freq: Once | ORAL | Status: DC

## 2022-11-22 MED ORDER — IMMUNE GLOBULIN (HUMAN) 20 GM/200ML IV SOLN
80.0000 g | Freq: Once | INTRAVENOUS | Status: AC
  Administered 2022-11-22: 40 g via INTRAVENOUS
  Filled 2022-11-22: qty 400

## 2022-11-22 NOTE — Patient Instructions (Signed)

## 2022-11-26 ENCOUNTER — Other Ambulatory Visit: Payer: Self-pay | Admitting: Family

## 2022-11-26 ENCOUNTER — Inpatient Hospital Stay: Payer: Medicare PPO

## 2022-11-26 ENCOUNTER — Telehealth: Payer: Self-pay | Admitting: Family

## 2022-11-26 DIAGNOSIS — D693 Immune thrombocytopenic purpura: Secondary | ICD-10-CM | POA: Diagnosis not present

## 2022-11-26 LAB — CBC WITH DIFFERENTIAL (CANCER CENTER ONLY)
Abs Immature Granulocytes: 0.12 10*3/uL — ABNORMAL HIGH (ref 0.00–0.07)
Basophils Absolute: 0 10*3/uL (ref 0.0–0.1)
Basophils Relative: 0 %
Eosinophils Absolute: 0 10*3/uL (ref 0.0–0.5)
Eosinophils Relative: 0 %
HCT: 44.4 % (ref 36.0–46.0)
Hemoglobin: 14.8 g/dL (ref 12.0–15.0)
Immature Granulocytes: 1 %
Lymphocytes Relative: 5 %
Lymphs Abs: 0.7 10*3/uL (ref 0.7–4.0)
MCH: 30 pg (ref 26.0–34.0)
MCHC: 33.3 g/dL (ref 30.0–36.0)
MCV: 90.1 fL (ref 80.0–100.0)
Monocytes Absolute: 0.8 10*3/uL (ref 0.1–1.0)
Monocytes Relative: 6 %
Neutro Abs: 12.3 10*3/uL — ABNORMAL HIGH (ref 1.7–7.7)
Neutrophils Relative %: 88 %
Platelet Count: 300 10*3/uL (ref 150–400)
RBC: 4.93 MIL/uL (ref 3.87–5.11)
RDW: 12.8 % (ref 11.5–15.5)
WBC Count: 14 10*3/uL — ABNORMAL HIGH (ref 4.0–10.5)
nRBC: 0 % (ref 0.0–0.2)

## 2022-11-26 LAB — CMP (CANCER CENTER ONLY)
ALT: 5 U/L (ref 0–44)
AST: 12 U/L — ABNORMAL LOW (ref 15–41)
Albumin: 4 g/dL (ref 3.5–5.0)
Alkaline Phosphatase: 47 U/L (ref 38–126)
Anion gap: 5 (ref 5–15)
BUN: 17 mg/dL (ref 8–23)
CO2: 27 mmol/L (ref 22–32)
Calcium: 9.9 mg/dL (ref 8.9–10.3)
Chloride: 100 mmol/L (ref 98–111)
Creatinine: 0.9 mg/dL (ref 0.44–1.00)
GFR, Estimated: >60 mL/min (ref >60)
Glucose, Bld: 135 mg/dL — ABNORMAL HIGH (ref 70–99)
Potassium: 4.8 mmol/L (ref 3.5–5.1)
Sodium: 132 mmol/L — ABNORMAL LOW (ref 135–145)
Total Bilirubin: 1.3 mg/dL — ABNORMAL HIGH (ref 0.3–1.2)
Total Protein: 8.6 g/dL — ABNORMAL HIGH (ref 6.5–8.1)

## 2022-11-26 MED ORDER — FLUCONAZOLE 100 MG PO TABS
100.0000 mg | ORAL_TABLET | Freq: Every day | ORAL | 0 refills | Status: DC
Start: 2022-11-26 — End: 2022-12-18

## 2022-11-26 NOTE — Telephone Encounter (Signed)
I was able to speak with the patient and discuss adjusting her prednisone as her platelet count is now up to 300. She will continue her same regimen with Diflucan and Protonix. She will reduce her prednisone to 60 mg PO daily. She verbalized understanding and agreement with the plan. No questions or concerns at this time. We will see her again this week on Friday for another lab check. Patient appreciative of call.

## 2022-11-28 ENCOUNTER — Telehealth: Payer: Self-pay | Admitting: *Deleted

## 2022-11-28 NOTE — Telephone Encounter (Signed)
Called patient and lvm of upcoming appointments - requested call back to confirm. 

## 2022-11-29 ENCOUNTER — Other Ambulatory Visit: Payer: Self-pay | Admitting: *Deleted

## 2022-11-29 DIAGNOSIS — K219 Gastro-esophageal reflux disease without esophagitis: Secondary | ICD-10-CM

## 2022-11-29 DIAGNOSIS — D696 Thrombocytopenia, unspecified: Secondary | ICD-10-CM

## 2022-11-29 DIAGNOSIS — D693 Immune thrombocytopenic purpura: Secondary | ICD-10-CM

## 2022-11-30 ENCOUNTER — Other Ambulatory Visit: Payer: Self-pay | Admitting: Family

## 2022-11-30 ENCOUNTER — Inpatient Hospital Stay: Payer: Medicare PPO

## 2022-11-30 ENCOUNTER — Telehealth: Payer: Self-pay

## 2022-11-30 DIAGNOSIS — K219 Gastro-esophageal reflux disease without esophagitis: Secondary | ICD-10-CM

## 2022-11-30 DIAGNOSIS — D693 Immune thrombocytopenic purpura: Secondary | ICD-10-CM

## 2022-11-30 DIAGNOSIS — D696 Thrombocytopenia, unspecified: Secondary | ICD-10-CM

## 2022-11-30 LAB — CBC WITH DIFFERENTIAL (CANCER CENTER ONLY)
Abs Immature Granulocytes: 0.19 10*3/uL — ABNORMAL HIGH (ref 0.00–0.07)
Basophils Absolute: 0 10*3/uL (ref 0.0–0.1)
Basophils Relative: 0 %
Eosinophils Absolute: 0 10*3/uL (ref 0.0–0.5)
Eosinophils Relative: 0 %
HCT: 43.6 % (ref 36.0–46.0)
Hemoglobin: 14.8 g/dL (ref 12.0–15.0)
Immature Granulocytes: 1 %
Lymphocytes Relative: 4 %
Lymphs Abs: 0.6 10*3/uL — ABNORMAL LOW (ref 0.7–4.0)
MCH: 30.7 pg (ref 26.0–34.0)
MCHC: 33.9 g/dL (ref 30.0–36.0)
MCV: 90.5 fL (ref 80.0–100.0)
Monocytes Absolute: 0.7 10*3/uL (ref 0.1–1.0)
Monocytes Relative: 5 %
Neutro Abs: 13.8 10*3/uL — ABNORMAL HIGH (ref 1.7–7.7)
Neutrophils Relative %: 90 %
Platelet Count: 286 10*3/uL (ref 150–400)
RBC: 4.82 MIL/uL (ref 3.87–5.11)
RDW: 13 % (ref 11.5–15.5)
WBC Count: 15.4 10*3/uL — ABNORMAL HIGH (ref 4.0–10.5)
nRBC: 0 % (ref 0.0–0.2)

## 2022-11-30 LAB — CMP (CANCER CENTER ONLY)
ALT: 5 U/L (ref 0–44)
AST: 14 U/L — ABNORMAL LOW (ref 15–41)
Albumin: 4 g/dL (ref 3.5–5.0)
Alkaline Phosphatase: 49 U/L (ref 38–126)
Anion gap: 6 (ref 5–15)
BUN: 15 mg/dL (ref 8–23)
CO2: 26 mmol/L (ref 22–32)
Calcium: 9.5 mg/dL (ref 8.9–10.3)
Chloride: 98 mmol/L (ref 98–111)
Creatinine: 0.89 mg/dL (ref 0.44–1.00)
GFR, Estimated: >60 mL/min (ref >60)
Glucose, Bld: 126 mg/dL — ABNORMAL HIGH (ref 70–99)
Potassium: 4.8 mmol/L (ref 3.5–5.1)
Sodium: 130 mmol/L — ABNORMAL LOW (ref 135–145)
Total Bilirubin: 1.5 mg/dL — ABNORMAL HIGH (ref 0.3–1.2)
Total Protein: 7.7 g/dL (ref 6.5–8.1)

## 2022-11-30 NOTE — Telephone Encounter (Signed)
Per Eileen Stanford NP request this RN called patient and told her that her platelets are 286. Since her platelets are holding steady Eileen Stanford NP recommends decreasing her prednisone dose to 40 mg daily and continue to take her Diflucan and protonix as prescribed. Teach back done by patient who stated she will take 2 tablets (20 mg tablets) daily. Reviewed patient next appointment date and time. Pt verbalized understanding and had no further questions.

## 2022-12-06 ENCOUNTER — Ambulatory Visit: Payer: Medicare PPO | Admitting: Rehabilitative and Restorative Service Providers"

## 2022-12-07 ENCOUNTER — Other Ambulatory Visit: Payer: Self-pay | Admitting: *Deleted

## 2022-12-07 DIAGNOSIS — D696 Thrombocytopenia, unspecified: Secondary | ICD-10-CM

## 2022-12-07 DIAGNOSIS — D693 Immune thrombocytopenic purpura: Secondary | ICD-10-CM

## 2022-12-10 ENCOUNTER — Encounter: Payer: Self-pay | Admitting: Hematology & Oncology

## 2022-12-10 ENCOUNTER — Inpatient Hospital Stay: Payer: Medicare PPO | Admitting: Hematology & Oncology

## 2022-12-10 ENCOUNTER — Inpatient Hospital Stay: Payer: Medicare PPO | Attending: Hematology & Oncology

## 2022-12-10 VITALS — BP 153/90 | HR 85 | Temp 98.6°F | Resp 17 | Wt 172.8 lb

## 2022-12-10 DIAGNOSIS — R42 Dizziness and giddiness: Secondary | ICD-10-CM | POA: Insufficient documentation

## 2022-12-10 DIAGNOSIS — Z7952 Long term (current) use of systemic steroids: Secondary | ICD-10-CM | POA: Insufficient documentation

## 2022-12-10 DIAGNOSIS — D693 Immune thrombocytopenic purpura: Secondary | ICD-10-CM

## 2022-12-10 DIAGNOSIS — D696 Thrombocytopenia, unspecified: Secondary | ICD-10-CM

## 2022-12-10 LAB — CBC WITH DIFFERENTIAL (CANCER CENTER ONLY)
Abs Immature Granulocytes: 0.05 10*3/uL (ref 0.00–0.07)
Basophils Absolute: 0 10*3/uL (ref 0.0–0.1)
Basophils Relative: 0 %
Eosinophils Absolute: 0 10*3/uL (ref 0.0–0.5)
Eosinophils Relative: 0 %
HCT: 44 % (ref 36.0–46.0)
Hemoglobin: 14.6 g/dL (ref 12.0–15.0)
Immature Granulocytes: 0 %
Lymphocytes Relative: 4 %
Lymphs Abs: 0.5 10*3/uL — ABNORMAL LOW (ref 0.7–4.0)
MCH: 30.6 pg (ref 26.0–34.0)
MCHC: 33.2 g/dL (ref 30.0–36.0)
MCV: 92.2 fL (ref 80.0–100.0)
Monocytes Absolute: 0.2 10*3/uL (ref 0.1–1.0)
Monocytes Relative: 2 %
Neutro Abs: 10.5 10*3/uL — ABNORMAL HIGH (ref 1.7–7.7)
Neutrophils Relative %: 94 %
Platelet Count: 186 10*3/uL (ref 150–400)
RBC: 4.77 MIL/uL (ref 3.87–5.11)
RDW: 13.3 % (ref 11.5–15.5)
WBC Count: 11.2 10*3/uL — ABNORMAL HIGH (ref 4.0–10.5)
nRBC: 0 % (ref 0.0–0.2)

## 2022-12-10 LAB — CMP (CANCER CENTER ONLY)
ALT: 10 U/L (ref 0–44)
AST: 15 U/L (ref 15–41)
Albumin: 4.2 g/dL (ref 3.5–5.0)
Alkaline Phosphatase: 40 U/L (ref 38–126)
Anion gap: 9 (ref 5–15)
BUN: 13 mg/dL (ref 8–23)
CO2: 27 mmol/L (ref 22–32)
Calcium: 9.8 mg/dL (ref 8.9–10.3)
Chloride: 100 mmol/L (ref 98–111)
Creatinine: 0.92 mg/dL (ref 0.44–1.00)
GFR, Estimated: >60 mL/min (ref >60)
Glucose, Bld: 130 mg/dL — ABNORMAL HIGH (ref 70–99)
Potassium: 5.4 mmol/L — ABNORMAL HIGH (ref 3.5–5.1)
Sodium: 136 mmol/L (ref 135–145)
Total Bilirubin: 1.3 mg/dL — ABNORMAL HIGH (ref 0.3–1.2)
Total Protein: 7.5 g/dL (ref 6.5–8.1)

## 2022-12-10 NOTE — Progress Notes (Signed)
Hematology and Oncology Follow Up Visit  Dominique Griffin [id number] Nov 18, 1950 72 y.o. 12/10/2022   Principle Diagnosis:  Decadron 40 mg PO daily x 4 days - completed 09/09/2022 IVIG for 2 doses - given on 3/4 and 09/11/2022   Current Therapy:        Prednisone 80 mg po q day   Interim History:  Dominique Griffin is here today for follow-up.  She is responding well to the prednisone.  Her platelet count went up quite nicely.  Her platelet count is slowly going back down.  Her plate count today is 186,000.  I had a long talk with she and her husband.  I think that the options that we have right now are probably Rituxan.  I think Rituxan would certainly be a reasonable option to try to help with her thrombocytopenia.  I think with Rituxan, you could give weekly Rituxan for 4 weeks and hopefully this can keep her platelet count stable.  The other options I think would also be to consider Nplate and or Promacta.  I think that she would like to just be watched right now.  We could certainly adjust her prednisone.  She does have Parkinson's.  She is on Sinemet.  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  Again, her platelet count has come up quite nicely.  As such, I do think that we have some "flexibility" as to how we have to treat her.  She has had no fever.  There has been no change in bowel or bladder habits.  She has had no cough or shortness of breath.  She has had no rashes.  She is incredibly interesting.  She was a Therapist, music at [employer].  Overall, I would say that her performance status is probably ECOG 1.    Medications:  Allergies as of 12/10/2022   No Known Allergies      Medication List        Accurate as of December 10, 2022  4:18 PM. If you have any questions, ask your nurse or doctor.          calcium carbonate 500 MG chewable tablet Commonly known as: TUMS - dosed in mg elemental calcium Chew 1 tablet by mouth daily.    camphor-menthol lotion Commonly known as: SARNA Apply 1 Application topically as needed for itching.   CARBIDOPA-LEVODOPA PO Take 25 mg by mouth 3 (three) times daily. One tablet by mouth (25-100 mg) three times a day around meal times *May increase dose to two tablets three times daily if needed*   Carbidopa-Levodopa ER 25-100 MG tablet controlled release Commonly known as: SINEMET CR Take 1 tablet by mouth 3 (three) times daily.   Cholecalciferol 50 MCG (2000 UT) Tabs Take by mouth.   fluconazole 100 MG tablet Commonly known as: DIFLUCAN Take 1 tablet (100 mg total) by mouth daily.   ibuprofen 200 MG tablet Commonly known as: ADVIL Take by mouth.   MULTIVITAMIN ADULT PO Take by mouth.   pantoprazole 40 MG tablet Commonly known as: PROTONIX TAKE 1 TABLET BY MOUTH 2 TIMES A DAY   predniSONE 20 MG tablet Commonly known as: DELTASONE Take 4 tablets (80 mg total) by mouth daily with breakfast.   pseudoephedrine 120 MG 12 hr tablet Commonly known as: SUDAFED Take 120 mg by mouth 2 (two) times daily.        Allergies: No Known Allergies  Past Medical History, Surgical history,  Social history, and Family History were reviewed and updated.  Review of Systems: Review of Systems  Constitutional: Negative.   HENT: Negative.    Eyes: Negative.   Respiratory: Negative.    Cardiovascular: Negative.   Gastrointestinal: Negative.   Genitourinary: Negative.   Musculoskeletal: Negative.   Skin: Negative.   Neurological:  Positive for tremors.  Endo/Heme/Allergies: Negative.   Psychiatric/Behavioral: Negative.        Physical Exam:  weight is 172 lb 12.8 oz (78.4 kg). Her oral temperature is 98.6 F (37 C). Her blood pressure is 153/90 (abnormal) and her pulse is 85. Her respiration is 17 and oxygen saturation is 100%.   Wt Readings from Last 3 Encounters:  12/10/22 172 lb 12.8 oz (78.4 kg)  11/19/22 173 lb (78.5 kg)  10/24/22 171 lb (77.6 kg)   Physical  Exam Vitals reviewed.  HENT:     Head: Normocephalic and atraumatic.  Eyes:     Pupils: Pupils are equal, round, and reactive to light.  Cardiovascular:     Rate and Rhythm: Normal rate and regular rhythm.     Heart sounds: Normal heart sounds.  Pulmonary:     Effort: Pulmonary effort is normal.     Breath sounds: Normal breath sounds.  Abdominal:     General: Bowel sounds are normal.     Palpations: Abdomen is soft.  Musculoskeletal:        General: No tenderness or deformity. Normal range of motion.     Cervical back: Normal range of motion.  Lymphadenopathy:     Cervical: No cervical adenopathy.  Skin:    General: Skin is warm and dry.     Findings: No erythema or rash.  Neurological:     Mental Status: She is alert and oriented to person, place, and time.  Psychiatric:        Behavior: Behavior normal.        Thought Content: Thought content normal.        Judgment: Judgment normal.      Lab Results  Component Value Date   WBC 11.2 (H) 12/10/2022   HGB 14.6 12/10/2022   HCT 44.0 12/10/2022   MCV 92.2 12/10/2022   PLT 186 12/10/2022   Lab Results  Component Value Date   FERRITIN 41 09/07/2022   IRON 120 09/07/2022   TIBC 396 09/07/2022   UIBC 276 09/07/2022   IRONPCTSAT 30 09/07/2022   Lab Results  Component Value Date   RETICCTPCT 1.5 09/07/2022   RBC 4.77 12/10/2022   No results found for: "KPAFRELGTCHN", "LAMBDASER", "KAPLAMBRATIO" No results found for: "IGGSERUM", "IGA", "IGMSERUM" Lab Results  Component Value Date   TOTALPROTELP 6.5 09/07/2022   ALBUMINELP 4.1 09/07/2022   A1GS 0.3 09/07/2022   A2GS 0.6 09/07/2022   BETS 1.0 09/07/2022   GAMS 0.5 09/07/2022   MSPIKE Not Observed 09/07/2022   SPEI Comment 09/07/2022     Chemistry      Component Value Date/Time   NA 136 12/10/2022 1514   K 5.4 (H) 12/10/2022 1514   CL 100 12/10/2022 1514   CO2 27 12/10/2022 1514   BUN 13 12/10/2022 1514   CREATININE 0.92 12/10/2022 1514      Component  Value Date/Time   CALCIUM 9.8 12/10/2022 1514   ALKPHOS 40 12/10/2022 1514   AST 15 12/10/2022 1514   ALT 10 12/10/2022 1514   BILITOT 1.3 (H) 12/10/2022 1514       Impression and Plan:    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   She has been on IVIG in the past.  She had 2 doses.  We have certainly consider further IVIG therapy for her.  I forgot to mention that she is complaining of some dizziness.  I do not know if this might be from the prednisone.  It also could be just from the Parkinson's that she has.  Again, we will keep her on the 80 mg of prednisone for right now.  We will check her blood counts weekly.  We will make adjustments to the prednisone dose.  I would probably see her back in about a month.  I think this would certainly be reasonable.  We could definitely get her in sooner if there is a problem with her platelets.  Josph Macho, MD 6/3/20244:18 PM

## 2022-12-14 ENCOUNTER — Encounter: Payer: Self-pay | Admitting: Family

## 2022-12-17 ENCOUNTER — Other Ambulatory Visit: Payer: Medicare PPO

## 2022-12-18 ENCOUNTER — Other Ambulatory Visit: Payer: Self-pay

## 2022-12-18 ENCOUNTER — Inpatient Hospital Stay: Payer: Medicare PPO

## 2022-12-18 DIAGNOSIS — D693 Immune thrombocytopenic purpura: Secondary | ICD-10-CM

## 2022-12-18 DIAGNOSIS — Z7952 Long term (current) use of systemic steroids: Secondary | ICD-10-CM | POA: Diagnosis not present

## 2022-12-18 DIAGNOSIS — R42 Dizziness and giddiness: Secondary | ICD-10-CM | POA: Diagnosis not present

## 2022-12-18 DIAGNOSIS — K219 Gastro-esophageal reflux disease without esophagitis: Secondary | ICD-10-CM

## 2022-12-18 LAB — CBC WITH DIFFERENTIAL (CANCER CENTER ONLY)
Abs Immature Granulocytes: 0.07 10*3/uL (ref 0.00–0.07)
Basophils Absolute: 0 10*3/uL (ref 0.0–0.1)
Basophils Relative: 0 %
Eosinophils Absolute: 0 10*3/uL (ref 0.0–0.5)
Eosinophils Relative: 0 %
HCT: 44.2 % (ref 36.0–46.0)
Hemoglobin: 14.6 g/dL (ref 12.0–15.0)
Immature Granulocytes: 1 %
Lymphocytes Relative: 4 %
Lymphs Abs: 0.5 10*3/uL — ABNORMAL LOW (ref 0.7–4.0)
MCH: 30.7 pg (ref 26.0–34.0)
MCHC: 33 g/dL (ref 30.0–36.0)
MCV: 93.1 fL (ref 80.0–100.0)
Monocytes Absolute: 0.3 10*3/uL (ref 0.1–1.0)
Monocytes Relative: 2 %
Neutro Abs: 11.1 10*3/uL — ABNORMAL HIGH (ref 1.7–7.7)
Neutrophils Relative %: 93 %
Platelet Count: 214 10*3/uL (ref 150–400)
RBC: 4.75 MIL/uL (ref 3.87–5.11)
RDW: 13.5 % (ref 11.5–15.5)
WBC Count: 12 10*3/uL — ABNORMAL HIGH (ref 4.0–10.5)
nRBC: 0 % (ref 0.0–0.2)

## 2022-12-18 MED ORDER — PANTOPRAZOLE SODIUM 40 MG PO TBEC
40.0000 mg | DELAYED_RELEASE_TABLET | Freq: Two times a day (BID) | ORAL | 0 refills | Status: DC
Start: 2022-12-18 — End: 2023-01-07

## 2022-12-18 MED ORDER — FLUCONAZOLE 100 MG PO TABS
100.0000 mg | ORAL_TABLET | Freq: Every day | ORAL | 0 refills | Status: DC
Start: 2022-12-18 — End: 2023-01-07

## 2022-12-24 ENCOUNTER — Telehealth: Payer: Self-pay

## 2022-12-24 ENCOUNTER — Inpatient Hospital Stay: Payer: Medicare PPO

## 2022-12-24 DIAGNOSIS — D693 Immune thrombocytopenic purpura: Secondary | ICD-10-CM

## 2022-12-24 DIAGNOSIS — R42 Dizziness and giddiness: Secondary | ICD-10-CM | POA: Diagnosis not present

## 2022-12-24 DIAGNOSIS — Z7952 Long term (current) use of systemic steroids: Secondary | ICD-10-CM | POA: Diagnosis not present

## 2022-12-24 LAB — CBC WITH DIFFERENTIAL (CANCER CENTER ONLY)
Abs Immature Granulocytes: 0.07 10*3/uL (ref 0.00–0.07)
Basophils Absolute: 0 10*3/uL (ref 0.0–0.1)
Basophils Relative: 0 %
Eosinophils Absolute: 0 10*3/uL (ref 0.0–0.5)
Eosinophils Relative: 0 %
HCT: 43 % (ref 36.0–46.0)
Hemoglobin: 14.2 g/dL (ref 12.0–15.0)
Immature Granulocytes: 1 %
Lymphocytes Relative: 4 %
Lymphs Abs: 0.5 10*3/uL — ABNORMAL LOW (ref 0.7–4.0)
MCH: 30.9 pg (ref 26.0–34.0)
MCHC: 33 g/dL (ref 30.0–36.0)
MCV: 93.5 fL (ref 80.0–100.0)
Monocytes Absolute: 0.3 10*3/uL (ref 0.1–1.0)
Monocytes Relative: 2 %
Neutro Abs: 10.7 10*3/uL — ABNORMAL HIGH (ref 1.7–7.7)
Neutrophils Relative %: 93 %
Platelet Count: 268 10*3/uL (ref 150–400)
RBC: 4.6 MIL/uL (ref 3.87–5.11)
RDW: 13.6 % (ref 11.5–15.5)
WBC Count: 11.5 10*3/uL — ABNORMAL HIGH (ref 4.0–10.5)
nRBC: 0 % (ref 0.0–0.2)

## 2022-12-24 NOTE — Telephone Encounter (Signed)
Notified pt via phone per Dr Myna Hidalgo to decrease Prednisone dose to 20mg  daily & recheck platelets next week. Pt verbalizes understanding using teachback. Pt is already scheduled for 6/24. dph

## 2022-12-25 ENCOUNTER — Encounter: Payer: Self-pay | Admitting: Family

## 2022-12-31 ENCOUNTER — Inpatient Hospital Stay: Payer: Medicare PPO

## 2022-12-31 DIAGNOSIS — D693 Immune thrombocytopenic purpura: Secondary | ICD-10-CM

## 2022-12-31 DIAGNOSIS — R42 Dizziness and giddiness: Secondary | ICD-10-CM | POA: Diagnosis not present

## 2022-12-31 DIAGNOSIS — Z7952 Long term (current) use of systemic steroids: Secondary | ICD-10-CM | POA: Diagnosis not present

## 2022-12-31 LAB — CBC WITH DIFFERENTIAL (CANCER CENTER ONLY)
Abs Immature Granulocytes: 0.06 10*3/uL (ref 0.00–0.07)
Basophils Absolute: 0.1 10*3/uL (ref 0.0–0.1)
Basophils Relative: 0 %
Eosinophils Absolute: 0 10*3/uL (ref 0.0–0.5)
Eosinophils Relative: 0 %
HCT: 44.4 % (ref 36.0–46.0)
Hemoglobin: 14.7 g/dL (ref 12.0–15.0)
Immature Granulocytes: 1 %
Lymphocytes Relative: 6 %
Lymphs Abs: 0.7 10*3/uL (ref 0.7–4.0)
MCH: 30.8 pg (ref 26.0–34.0)
MCHC: 33.1 g/dL (ref 30.0–36.0)
MCV: 92.9 fL (ref 80.0–100.0)
Monocytes Absolute: 0.7 10*3/uL (ref 0.1–1.0)
Monocytes Relative: 5 %
Neutro Abs: 11.1 10*3/uL — ABNORMAL HIGH (ref 1.7–7.7)
Neutrophils Relative %: 88 %
Platelet Count: 234 10*3/uL (ref 150–400)
RBC: 4.78 MIL/uL (ref 3.87–5.11)
RDW: 13.2 % (ref 11.5–15.5)
WBC Count: 12.6 10*3/uL — ABNORMAL HIGH (ref 4.0–10.5)
nRBC: 0 % (ref 0.0–0.2)

## 2023-01-01 ENCOUNTER — Telehealth: Payer: Self-pay

## 2023-01-01 NOTE — Telephone Encounter (Signed)
Called patient and informed her platelet count was good and could drop her prednisone to 10mg  daily per Vicente Masson, we will follow up in 1 week on labs. Pt confirmed and declines any other questions or concerns.

## 2023-01-07 ENCOUNTER — Inpatient Hospital Stay: Payer: Medicare PPO | Attending: Hematology & Oncology

## 2023-01-07 ENCOUNTER — Inpatient Hospital Stay (HOSPITAL_BASED_OUTPATIENT_CLINIC_OR_DEPARTMENT_OTHER): Payer: Medicare PPO | Admitting: Hematology & Oncology

## 2023-01-07 ENCOUNTER — Encounter: Payer: Self-pay | Admitting: Hematology & Oncology

## 2023-01-07 ENCOUNTER — Other Ambulatory Visit: Payer: Self-pay

## 2023-01-07 DIAGNOSIS — E538 Deficiency of other specified B group vitamins: Secondary | ICD-10-CM | POA: Insufficient documentation

## 2023-01-07 DIAGNOSIS — Z7952 Long term (current) use of systemic steroids: Secondary | ICD-10-CM | POA: Insufficient documentation

## 2023-01-07 DIAGNOSIS — D693 Immune thrombocytopenic purpura: Secondary | ICD-10-CM | POA: Diagnosis not present

## 2023-01-07 DIAGNOSIS — K219 Gastro-esophageal reflux disease without esophagitis: Secondary | ICD-10-CM | POA: Diagnosis not present

## 2023-01-07 LAB — CBC WITH DIFFERENTIAL (CANCER CENTER ONLY)
Abs Immature Granulocytes: 0.08 10*3/uL — ABNORMAL HIGH (ref 0.00–0.07)
Basophils Absolute: 0.1 10*3/uL (ref 0.0–0.1)
Basophils Relative: 0 %
Eosinophils Absolute: 0 10*3/uL (ref 0.0–0.5)
Eosinophils Relative: 0 %
HCT: 43.1 % (ref 36.0–46.0)
Hemoglobin: 14.6 g/dL (ref 12.0–15.0)
Immature Granulocytes: 1 %
Lymphocytes Relative: 6 %
Lymphs Abs: 0.7 10*3/uL (ref 0.7–4.0)
MCH: 31.3 pg (ref 26.0–34.0)
MCHC: 33.9 g/dL (ref 30.0–36.0)
MCV: 92.5 fL (ref 80.0–100.0)
Monocytes Absolute: 0.6 10*3/uL (ref 0.1–1.0)
Monocytes Relative: 5 %
Neutro Abs: 10.4 10*3/uL — ABNORMAL HIGH (ref 1.7–7.7)
Neutrophils Relative %: 88 %
Platelet Count: 214 10*3/uL (ref 150–400)
RBC: 4.66 MIL/uL (ref 3.87–5.11)
RDW: 13.3 % (ref 11.5–15.5)
WBC Count: 11.9 10*3/uL — ABNORMAL HIGH (ref 4.0–10.5)
nRBC: 0 % (ref 0.0–0.2)

## 2023-01-07 LAB — CMP (CANCER CENTER ONLY)
ALT: <5 U/L (ref 0–44)
AST: 10 U/L — ABNORMAL LOW (ref 15–41)
Albumin: 4.5 g/dL (ref 3.5–5.0)
Alkaline Phosphatase: 41 U/L (ref 38–126)
Anion gap: 8 (ref 5–15)
BUN: 10 mg/dL (ref 8–23)
CO2: 26 mmol/L (ref 22–32)
Calcium: 9.7 mg/dL (ref 8.9–10.3)
Chloride: 99 mmol/L (ref 98–111)
Creatinine: 0.86 mg/dL (ref 0.44–1.00)
GFR, Estimated: >60 mL/min (ref >60)
Glucose, Bld: 111 mg/dL — ABNORMAL HIGH (ref 70–99)
Potassium: 4.2 mmol/L (ref 3.5–5.1)
Sodium: 133 mmol/L — ABNORMAL LOW (ref 135–145)
Total Bilirubin: 1.1 mg/dL (ref 0.3–1.2)
Total Protein: 7.1 g/dL (ref 6.5–8.1)

## 2023-01-07 LAB — SAVE SMEAR(SSMR), FOR PROVIDER SLIDE REVIEW

## 2023-01-07 LAB — VITAMIN B12: Vitamin B-12: 140 pg/mL — ABNORMAL LOW (ref 180–914)

## 2023-01-07 MED ORDER — FLUCONAZOLE 100 MG PO TABS
100.0000 mg | ORAL_TABLET | Freq: Every day | ORAL | 3 refills | Status: DC
Start: 2023-01-07 — End: 2023-05-31

## 2023-01-07 MED ORDER — PANTOPRAZOLE SODIUM 40 MG PO TBEC
40.0000 mg | DELAYED_RELEASE_TABLET | Freq: Every day | ORAL | 3 refills | Status: DC
Start: 2023-01-07 — End: 2023-04-17

## 2023-01-07 NOTE — Progress Notes (Signed)
Hematology and Oncology Follow Up Visit  Dominique Griffin [id number] [birth year] 72 y.o. 01/07/2023   Principle Diagnosis:  Decadron 40 mg PO daily x 4 days - completed 09/09/2022 IVIG for 2 doses - given on 3/4 and 09/11/2022   Current Therapy:        Prednisone 80 mg po q day --taper down to 5 mg p.o. daily on 01/07/2023   Interim History:  Dominique Griffin is here today for follow-up.  She is doing quite nicely.  She is responding to steroids.  We are now going to taper down to 5 mg p.o. daily.  She has had no problems with bleeding or bruising.  She does have the Parkinson's.  She will see her Neurologist at [facility]  She has had no change in bowel or bladder habits.  There is been no cough or shortness of breath.  She has had no nausea or vomiting.  There is been no headache.  Overall, I would say performance status is probably ECOG 1.     Medications:  Allergies as of 01/07/2023   No Known Allergies      Medication List        Accurate as of January 07, 2023  1:18 PM. If you have any questions, ask your nurse or doctor.          STOP taking these medications    camphor-menthol lotion Commonly known as: SARNA Stopped by: Josph Macho, MD   ibuprofen 200 MG tablet Commonly known as: ADVIL Stopped by: Josph Macho, MD   MULTIVITAMIN ADULT PO Stopped by: Josph Macho, MD       TAKE these medications    calcium carbonate 500 MG chewable tablet Commonly known as: TUMS - dosed in mg elemental calcium Chew 1 tablet by mouth daily.   Carbidopa-Levodopa ER 25-100 MG tablet controlled release Commonly known as: SINEMET CR Take 1 tablet by mouth 3 (three) times daily. What changed: Another medication with the same name was removed. Continue taking this medication, and follow the directions you see here. Changed by: Josph Macho, MD   carbidopa-levodopa 25-100 MG tablet Commonly known as: SINEMET IR Take 1 tablet by mouth 3 (three) times daily. What  changed: Another medication with the same name was removed. Continue taking this medication, and follow the directions you see here. Changed by: Josph Macho, MD   Cholecalciferol 50 MCG (2000 UT) Tabs Take by mouth.   fluconazole 100 MG tablet Commonly known as: DIFLUCAN Take 1 tablet (100 mg total) by mouth daily.   pantoprazole 40 MG tablet Commonly known as: PROTONIX Take 1 tablet (40 mg total) by mouth 2 (two) times daily.   predniSONE 20 MG tablet Commonly known as: DELTASONE Take 4 tablets (80 mg total) by mouth daily with breakfast.   pseudoephedrine 120 MG 12 hr tablet Commonly known as: SUDAFED Take 120 mg by mouth 2 (two) times daily.        Allergies: No Known Allergies  Past Medical History, Surgical history, Social history, and Family History were reviewed and updated.  Review of Systems: Review of Systems  Constitutional: Negative.   HENT: Negative.    Eyes: Negative.   Respiratory: Negative.    Cardiovascular: Negative.   Gastrointestinal: Negative.   Genitourinary: Negative.   Musculoskeletal: Negative.   Skin: Negative.   Neurological:  Positive for tremors.  Endo/Heme/Allergies: Negative.   Psychiatric/Behavioral: Negative.        Physical Exam:  height is  5\' 6"  (1.676 m) and weight is 174 lb (78.9 kg). Her oral temperature is 99.6 F (37.6 C). Her blood pressure is 152/78 (abnormal) and her pulse is 85. Her respiration is 16 and oxygen saturation is 100%.   Wt Readings from Last 3 Encounters:  01/07/23 174 lb (78.9 kg)  12/10/22 172 lb 12.8 oz (78.4 kg)  11/19/22 173 lb (78.5 kg)   Physical Exam Vitals reviewed.  HENT:     Head: Normocephalic and atraumatic.  Eyes:     Pupils: Pupils are equal, round, and reactive to light.  Cardiovascular:     Rate and Rhythm: Normal rate and regular rhythm.     Heart sounds: Normal heart sounds.  Pulmonary:     Effort: Pulmonary effort is normal.     Breath sounds: Normal breath sounds.   Abdominal:     General: Bowel sounds are normal.     Palpations: Abdomen is soft.  Musculoskeletal:        General: No tenderness or deformity. Normal range of motion.     Cervical back: Normal range of motion.  Lymphadenopathy:     Cervical: No cervical adenopathy.  Skin:    General: Skin is warm and dry.     Findings: No erythema or rash.  Neurological:     Mental Status: She is alert and oriented to person, place, and time.  Psychiatric:        Behavior: Behavior normal.        Thought Content: Thought content normal.        Judgment: Judgment normal.      Lab Results  Component Value Date   WBC 11.9 (H) 01/07/2023   HGB 14.6 01/07/2023   HCT 43.1 01/07/2023   MCV 92.5 01/07/2023   PLT 214 01/07/2023   Lab Results  Component Value Date   FERRITIN 41 09/07/2022   IRON 120 09/07/2022   TIBC 396 09/07/2022   UIBC 276 09/07/2022   IRONPCTSAT 30 09/07/2022   Lab Results  Component Value Date   RETICCTPCT 1.5 09/07/2022   RBC 4.66 01/07/2023   No results found for: "KPAFRELGTCHN", "LAMBDASER", "KAPLAMBRATIO" No results found for: "IGGSERUM", "IGA", "IGMSERUM" Lab Results  Component Value Date   TOTALPROTELP 6.5 09/07/2022   ALBUMINELP 4.1 09/07/2022   A1GS 0.3 09/07/2022   A2GS 0.6 09/07/2022   BETS 1.0 09/07/2022   GAMS 0.5 09/07/2022   MSPIKE Not Observed 09/07/2022   SPEI Comment 09/07/2022     Chemistry      Component Value Date/Time   NA 133 (L) 01/07/2023 1217   K 4.2 01/07/2023 1217   CL 99 01/07/2023 1217   CO2 26 01/07/2023 1217   BUN 10 01/07/2023 1217   CREATININE 0.86 01/07/2023 1217      Component Value Date/Time   CALCIUM 9.7 01/07/2023 1217   ALKPHOS 41 01/07/2023 1217   AST 10 (L) 01/07/2023 1217   ALT <5 01/07/2023 1217   BILITOT 1.1 01/07/2023 1217       Impression and Plan:    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  Again, she is responding very nicely to prednisone taper.  Of note, she  did get IVIG back in May.  Again, we will watch her for right now.  I do not see that we have to do anything additional.  We do not have to put her on Rituxan.  I do not think she needs Nplate or Promacta.  We  can also decrease her follow-ups were for lab work to every 3 weeks now.   I will see her back myself in 6 weeks.    Josph Macho, MD 7/1/20241:18 PM

## 2023-01-08 DIAGNOSIS — G20A2 Parkinson's disease without dyskinesia, with fluctuations: Secondary | ICD-10-CM | POA: Diagnosis not present

## 2023-01-16 ENCOUNTER — Encounter: Payer: Self-pay | Admitting: Hematology & Oncology

## 2023-01-16 NOTE — Telephone Encounter (Signed)
LMTCB

## 2023-01-17 ENCOUNTER — Encounter: Payer: Self-pay | Admitting: Hematology & Oncology

## 2023-01-21 ENCOUNTER — Encounter: Payer: Self-pay | Admitting: Hematology & Oncology

## 2023-01-25 ENCOUNTER — Telehealth: Payer: Self-pay

## 2023-01-25 NOTE — Telephone Encounter (Signed)
Received phone call from patient inquiring what she should do if she has a dental emergency over the weekend. Pt educated that her platelets are about 200 and she is not on active chemotherapy and to proceed per dentist recommendations. Pt aware she can take tylenol for any discomfort. Pt verbalized understanding and had no further questions.

## 2023-01-28 ENCOUNTER — Inpatient Hospital Stay: Payer: Medicare PPO

## 2023-01-29 ENCOUNTER — Inpatient Hospital Stay: Payer: Medicare PPO

## 2023-01-29 DIAGNOSIS — D693 Immune thrombocytopenic purpura: Secondary | ICD-10-CM | POA: Diagnosis not present

## 2023-01-29 DIAGNOSIS — Z7952 Long term (current) use of systemic steroids: Secondary | ICD-10-CM | POA: Diagnosis not present

## 2023-01-29 LAB — CBC WITH DIFFERENTIAL (CANCER CENTER ONLY)
Abs Immature Granulocytes: 0.07 10*3/uL (ref 0.00–0.07)
Basophils Absolute: 0.1 10*3/uL (ref 0.0–0.1)
Basophils Relative: 1 %
Eosinophils Absolute: 0.1 10*3/uL (ref 0.0–0.5)
Eosinophils Relative: 1 %
HCT: 42.4 % (ref 36.0–46.0)
Hemoglobin: 14.1 g/dL (ref 12.0–15.0)
Immature Granulocytes: 1 %
Lymphocytes Relative: 9 %
Lymphs Abs: 1 10*3/uL (ref 0.7–4.0)
MCH: 30.8 pg (ref 26.0–34.0)
MCHC: 33.3 g/dL (ref 30.0–36.0)
MCV: 92.6 fL (ref 80.0–100.0)
Monocytes Absolute: 0.9 10*3/uL (ref 0.1–1.0)
Monocytes Relative: 9 %
Neutro Abs: 8.8 10*3/uL — ABNORMAL HIGH (ref 1.7–7.7)
Neutrophils Relative %: 79 %
Platelet Count: 185 10*3/uL (ref 150–400)
RBC: 4.58 MIL/uL (ref 3.87–5.11)
RDW: 13.1 % (ref 11.5–15.5)
WBC Count: 10.9 10*3/uL — ABNORMAL HIGH (ref 4.0–10.5)
nRBC: 0 % (ref 0.0–0.2)

## 2023-01-30 ENCOUNTER — Encounter: Payer: Self-pay | Admitting: Neurology

## 2023-01-31 ENCOUNTER — Telehealth: Payer: Self-pay

## 2023-01-31 NOTE — Telephone Encounter (Signed)
Received phone call from patient inquiring if she needed to change her medications after her lab appointment yesterday.  Reviewed pt questions with Dr. Myna Hidalgo and no changes to be made. Pt aware that there are no changes in her medications at this time. Pt verbalized understanding and had no further questions. Pt aware of next appointment date and time.

## 2023-02-18 ENCOUNTER — Other Ambulatory Visit: Payer: Medicare PPO

## 2023-02-18 ENCOUNTER — Ambulatory Visit: Payer: Medicare PPO | Admitting: Hematology & Oncology

## 2023-02-18 DIAGNOSIS — G20A2 Parkinson's disease without dyskinesia, with fluctuations: Secondary | ICD-10-CM | POA: Diagnosis not present

## 2023-02-19 ENCOUNTER — Encounter: Payer: Self-pay | Admitting: Internal Medicine

## 2023-02-20 ENCOUNTER — Inpatient Hospital Stay (HOSPITAL_BASED_OUTPATIENT_CLINIC_OR_DEPARTMENT_OTHER): Payer: Medicare PPO | Admitting: Hematology & Oncology

## 2023-02-20 ENCOUNTER — Encounter: Payer: Self-pay | Admitting: Hematology & Oncology

## 2023-02-20 ENCOUNTER — Other Ambulatory Visit: Payer: Self-pay

## 2023-02-20 ENCOUNTER — Inpatient Hospital Stay: Payer: Medicare PPO | Attending: Hematology & Oncology

## 2023-02-20 ENCOUNTER — Telehealth: Payer: Self-pay

## 2023-02-20 VITALS — BP 132/68 | HR 82 | Temp 98.4°F | Resp 18 | Ht 66.0 in | Wt 178.0 lb

## 2023-02-20 DIAGNOSIS — K219 Gastro-esophageal reflux disease without esophagitis: Secondary | ICD-10-CM

## 2023-02-20 DIAGNOSIS — D693 Immune thrombocytopenic purpura: Secondary | ICD-10-CM | POA: Insufficient documentation

## 2023-02-20 DIAGNOSIS — N2889 Other specified disorders of kidney and ureter: Secondary | ICD-10-CM | POA: Diagnosis not present

## 2023-02-20 LAB — CBC WITH DIFFERENTIAL (CANCER CENTER ONLY)
Abs Immature Granulocytes: 0.04 10*3/uL (ref 0.00–0.07)
Basophils Absolute: 0.1 10*3/uL (ref 0.0–0.1)
Basophils Relative: 1 %
Eosinophils Absolute: 0.1 10*3/uL (ref 0.0–0.5)
Eosinophils Relative: 1 %
HCT: 41.7 % (ref 36.0–46.0)
Hemoglobin: 13.9 g/dL (ref 12.0–15.0)
Immature Granulocytes: 0 %
Lymphocytes Relative: 10 %
Lymphs Abs: 0.9 10*3/uL (ref 0.7–4.0)
MCH: 31.2 pg (ref 26.0–34.0)
MCHC: 33.3 g/dL (ref 30.0–36.0)
MCV: 93.5 fL (ref 80.0–100.0)
Monocytes Absolute: 0.8 10*3/uL (ref 0.1–1.0)
Monocytes Relative: 8 %
Neutro Abs: 7.8 10*3/uL — ABNORMAL HIGH (ref 1.7–7.7)
Neutrophils Relative %: 80 %
Platelet Count: 7 10*3/uL — CL (ref 150–400)
RBC: 4.46 MIL/uL (ref 3.87–5.11)
RDW: 12.6 % (ref 11.5–15.5)
Smear Review: NORMAL
WBC Count: 9.6 10*3/uL (ref 4.0–10.5)
nRBC: 0 % (ref 0.0–0.2)

## 2023-02-20 LAB — CMP (CANCER CENTER ONLY)
ALT: <5 U/L (ref 0–44)
AST: 11 U/L — ABNORMAL LOW (ref 15–41)
Albumin: 4.5 g/dL (ref 3.5–5.0)
Alkaline Phosphatase: 48 U/L (ref 38–126)
Anion gap: 7 (ref 5–15)
BUN: 11 mg/dL (ref 8–23)
CO2: 28 mmol/L (ref 22–32)
Calcium: 9.2 mg/dL (ref 8.9–10.3)
Chloride: 100 mmol/L (ref 98–111)
Creatinine: 0.74 mg/dL (ref 0.44–1.00)
GFR, Estimated: >60 mL/min (ref >60)
Glucose, Bld: 108 mg/dL — ABNORMAL HIGH (ref 70–99)
Potassium: 4.9 mmol/L (ref 3.5–5.1)
Sodium: 135 mmol/L (ref 135–145)
Total Bilirubin: 1.5 mg/dL — ABNORMAL HIGH (ref 0.3–1.2)
Total Protein: 6.5 g/dL (ref 6.5–8.1)

## 2023-02-20 LAB — SAVE SMEAR(SSMR), FOR PROVIDER SLIDE REVIEW

## 2023-02-20 MED ORDER — DEXAMETHASONE 4 MG PO TABS: 20.0000 mg | ORAL_TABLET | Freq: Two times a day (BID) | ORAL | 4 refills | Status: AC

## 2023-02-20 NOTE — Telephone Encounter (Signed)
Critical plt of 7 received from lab, MD notified

## 2023-02-20 NOTE — Progress Notes (Signed)
START OFF PATHWAY REGIMEN - Immune Thrombocytopenia (ITP)   [id number]:Rituximab IV/SUBQ D1 q7 Days:   Cycle 1: A cycle is 7 days:     Rituximab-xxxx    Cycles 2 and beyond: A cycle is every 7 days:     Rituximab and hyaluronidase human   **Always confirm dose/schedule in your pharmacy ordering system**  Patient Characteristics: Relapsed/Refractory, Rapid Response Needed (1 - 2 Days) Disease Classification: Relapsed / Refractory Intent of Therapy: Curative Intent, Discussed with Patient

## 2023-02-20 NOTE — Progress Notes (Signed)
Hematology and Oncology Follow Up Visit  Dominique Griffin [id number] [birth year] 72 y.o. 02/20/2023   Principle Diagnosis:  ITP-relapsed   Current Therapy:        Prednisone 80 mg po q day --taper down to 5 mg p.o. daily on 01/07/2023 Decadron 40 mg p.o. daily x 4 days-start 02/20/2023 IVIG 1 g/kg x 1 day-given on 02/22/2023 Rituxan 30 to 75 mg/m weekly x 4 weeks-start on 02/27/2023   Interim History:  Dominique Griffin is here today for follow-up.  Unfortunately, she now has recurred again.  Dominique Griffin she has relapsed again.  Her platelet count 7000 today.  I am not sure as to why she would relapse again.  I hate this for her.  She has been on a slow prednisone taper.  We clearly will have to be more aggressive in.  I think we are to have to incorporate Rituxan into the protocol now.  I think this is going to best way for [place] to try to get her into remission.  She has had no bleeding.  She has had no bruising.  There has been no nausea or vomiting.  She has had no recent travel.  She has had no change in bowel or bladder habits.  She is being followed for Parkinson's at [facility].  She is on Sinemet.  There has been no issues with headache.  Overall, I would say performance status is probably ECOG 1.       Medications:  Allergies as of 02/20/2023   No Known Allergies      Medication List        Accurate as of February 20, 2023  1:07 PM. If you have any questions, ask your nurse or doctor.          STOP taking these medications    calcium carbonate 500 MG chewable tablet Commonly known as: TUMS - dosed in mg elemental calcium Stopped by: Josph Macho       TAKE these medications    Carbidopa-Levodopa ER 25-100 MG tablet controlled release Commonly known as: SINEMET CR Take 1 tablet by mouth 3 (three) times daily.   carbidopa-levodopa 25-100 MG tablet Commonly known as: SINEMET IR Take 1 tablet by mouth 3 (three) times daily.   Cholecalciferol 50 MCG (2000 UT) Tabs Take by  mouth.   cyanocobalamin 1000 MCG tablet Commonly known as: VITAMIN B12 Take 5,000 mcg by mouth daily.   fluconazole 100 MG tablet Commonly known as: DIFLUCAN Take 1 tablet (100 mg total) by mouth daily.   pantoprazole 40 MG tablet Commonly known as: PROTONIX Take 1 tablet (40 mg total) by mouth daily.   predniSONE 20 MG tablet Commonly known as: DELTASONE Take 4 tablets (80 mg total) by mouth daily with breakfast.   pseudoephedrine 120 MG 12 hr tablet Commonly known as: SUDAFED Take 120 mg by mouth 2 (two) times daily.        Allergies: No Known Allergies  Past Medical History, Surgical history, Social history, and Family History were reviewed and updated.  Review of Systems: Review of Systems  Constitutional: Negative.   HENT: Negative.    Eyes: Negative.   Respiratory: Negative.    Cardiovascular: Negative.   Gastrointestinal: Negative.   Genitourinary: Negative.   Musculoskeletal: Negative.   Skin: Negative.   Neurological:  Positive for tremors.  Endo/Heme/Allergies: Negative.   Psychiatric/Behavioral: Negative.        Physical Exam:  height is 5\' 6"  (1.676 m) and weight is 178 lb (  80.7 kg). Her oral temperature is 98.4 F (36.9 C). Her blood pressure is 132/68 and her pulse is 82. Her respiration is 18 and oxygen saturation is 98%.   Wt Readings from Last 3 Encounters:  02/20/23 178 lb (80.7 kg)  01/07/23 174 lb (78.9 kg)  12/10/22 172 lb 12.8 oz (78.4 kg)   Physical Exam Vitals reviewed.  HENT:     Head: Normocephalic and atraumatic.  Eyes:     Pupils: Pupils are equal, round, and reactive to light.  Cardiovascular:     Rate and Rhythm: Normal rate and regular rhythm.     Heart sounds: Normal heart sounds.  Pulmonary:     Effort: Pulmonary effort is normal.     Breath sounds: Normal breath sounds.  Abdominal:     General: Bowel sounds are normal.     Palpations: Abdomen is soft.  Musculoskeletal:        General: No tenderness or deformity.  Normal range of motion.     Cervical back: Normal range of motion.  Lymphadenopathy:     Cervical: No cervical adenopathy.  Skin:    General: Skin is warm and dry.     Findings: No erythema or rash.  Neurological:     Mental Status: She is alert and oriented to person, place, and time.  Psychiatric:        Behavior: Behavior normal.        Thought Content: Thought content normal.        Judgment: Judgment normal.     Lab Results  Component Value Date   WBC 9.6 02/20/2023   HGB 13.9 02/20/2023   HCT 41.7 02/20/2023   MCV 93.5 02/20/2023   PLT 7 (LL) 02/20/2023   Lab Results  Component Value Date   FERRITIN 41 09/07/2022   IRON 120 09/07/2022   TIBC 396 09/07/2022   UIBC 276 09/07/2022   IRONPCTSAT 30 09/07/2022   Lab Results  Component Value Date   RETICCTPCT 1.5 09/07/2022   RBC 4.46 02/20/2023   No results found for: "KPAFRELGTCHN", "LAMBDASER", "KAPLAMBRATIO" No results found for: "IGGSERUM", "IGA", "IGMSERUM" Lab Results  Component Value Date   TOTALPROTELP 6.5 09/07/2022   ALBUMINELP 4.1 09/07/2022   A1GS 0.3 09/07/2022   A2GS 0.6 09/07/2022   BETS 1.0 09/07/2022   GAMS 0.5 09/07/2022   MSPIKE Not Observed 09/07/2022   SPEI Comment 09/07/2022     Chemistry      Component Value Date/Time   NA 135 02/20/2023 1203   K 4.9 02/20/2023 1203   CL 100 02/20/2023 1203   CO2 28 02/20/2023 1203   BUN 11 02/20/2023 1203   CREATININE 0.74 02/20/2023 1203      Component Value Date/Time   CALCIUM 9.2 02/20/2023 1203   ALKPHOS 48 02/20/2023 1203   AST 11 (L) 02/20/2023 1203   ALT <5 02/20/2023 1203   BILITOT 1.5 (H) 02/20/2023 1203       Impression and Plan:    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  She was initially diagnosed earlier this year.  We had her on a prednisone taper.  She now is relapsed.  Again we will get her on Decadron and IVIG for right now.  I am sure she will respond to this.  Ultimately, I will  try her on Rituxan.  I think this would be very reasonable.  She may need to consider a splenectomy.  I know this would be  a treatment option down the road.  I do not think we have to utilize nplate right now.  I think that she will "bump" her platelets with the Decadron and IVIG.  We will try to get everything started tomorrow.  I would like to also get a ultrasound of her spleen to make sure there is nothing going on with her spleen that might have contributed to this relapse.   I would like to have her come back to see [place] in another couple weeks.   Josph Macho, MD 8/14/20241:07 PM

## 2023-02-21 ENCOUNTER — Encounter: Payer: Self-pay | Admitting: Family

## 2023-02-21 ENCOUNTER — Inpatient Hospital Stay: Payer: Medicare PPO

## 2023-02-21 VITALS — BP 142/74 | HR 86 | Temp 97.7°F | Resp 17 | Wt 178.0 lb

## 2023-02-21 DIAGNOSIS — D693 Immune thrombocytopenic purpura: Secondary | ICD-10-CM

## 2023-02-21 MED ORDER — ACETAMINOPHEN 325 MG PO TABS: 650.0000 mg | ORAL_TABLET | Freq: Once | ORAL | Status: DC

## 2023-02-21 MED ORDER — IMMUNE GLOBULIN (HUMAN) 20 GM/200ML IV SOLN
80.0000 g | Freq: Once | INTRAVENOUS | Status: AC
  Administered 2023-02-21: 80 g via INTRAVENOUS
  Filled 2023-02-21: qty 800

## 2023-02-21 MED ORDER — DIPHENHYDRAMINE HCL 25 MG PO CAPS: 25.0000 mg | ORAL_CAPSULE | Freq: Once | ORAL | Status: DC

## 2023-02-21 NOTE — Progress Notes (Signed)
Patient in chemotherapy education class with husband to discuss Rituxan. She has Immune Thrombocytopenia (ITP). Discussed how Rituxan can be used in different ways. It can be used for Leukemia and Lymphoma, treats autoimmune conditions, such as arthritis. In this case, we are treating ITP. It works by slowing down an overactive immune system. It is a monoclonal antibody. Discussed side effects of Rituxan, which include immune system problems, infection, low blood counts such as WBC and platelets, allergic or infusional reactions, joint pain, headaches, weakness or fatigue and low blood pressure. Educational materials given to her. All questions answered at this time. Instructed her to use MyChart if she has any other questions or concerns. She and husband verbalized understanding.

## 2023-02-21 NOTE — Patient Instructions (Signed)

## 2023-02-22 ENCOUNTER — Ambulatory Visit (HOSPITAL_BASED_OUTPATIENT_CLINIC_OR_DEPARTMENT_OTHER)
Admission: RE | Admit: 2023-02-22 | Discharge: 2023-02-22 | Disposition: A | Discharge status: Home / Self Care | Payer: Medicare PPO | Source: Ambulatory Visit | Attending: Hematology & Oncology | Admitting: Hematology & Oncology

## 2023-02-22 DIAGNOSIS — D693 Immune thrombocytopenic purpura: Secondary | ICD-10-CM | POA: Insufficient documentation

## 2023-02-22 DIAGNOSIS — R93422 Abnormal radiologic findings on diagnostic imaging of left kidney: Secondary | ICD-10-CM | POA: Diagnosis not present

## 2023-02-22 NOTE — Progress Notes (Unsigned)
Pharmacist Chemotherapy Monitoring - Initial Assessment    Anticipated start date: 02/27/23   The following has been reviewed per standard work regarding the patient's treatment regimen: The patient's diagnosis, treatment plan and drug doses, and organ/hematologic function Lab orders and baseline tests specific to treatment regimen  The treatment plan start date, drug sequencing, and pre-medications Prior authorization status  Patient's documented medication list, including drug-drug interaction screen and prescriptions for anti-emetics and supportive care specific to the treatment regimen The drug concentrations, fluid compatibility, administration routes, and timing of the medications to be used The patient's access for treatment and lifetime cumulative dose history, if applicable  The patient's medication allergies and previous infusion related reactions, if applicable   Changes made to treatment plan:  N/A  Follow up needed:  N/A   Candelaria Stagers, [facility], 02/22/2023  2:43 PM

## 2023-02-23 NOTE — Addendum Note (Signed)
Addended by: Josph Macho on: 02/23/2023 06:19 AM   Modules accepted: Orders

## 2023-02-25 ENCOUNTER — Telehealth: Payer: Self-pay

## 2023-02-25 DIAGNOSIS — K8020 Calculus of gallbladder without cholecystitis without obstruction: Secondary | ICD-10-CM | POA: Insufficient documentation

## 2023-02-25 NOTE — Telephone Encounter (Signed)
-----   Message from Josph Macho sent at 02/23/2023  6:15 AM EDT ----- Please call and let her know that the spleen looks fine.  However, there does appear to be something in the left kidney.  We will have to get an MRI to see what that might be.  Hopefully it is not a tumor.  Cindee Lame

## 2023-02-25 NOTE — Telephone Encounter (Signed)
Advised via MyChart.

## 2023-02-26 ENCOUNTER — Other Ambulatory Visit: Payer: Self-pay | Admitting: *Deleted

## 2023-02-26 DIAGNOSIS — D693 Immune thrombocytopenic purpura: Secondary | ICD-10-CM

## 2023-02-27 ENCOUNTER — Inpatient Hospital Stay: Payer: Medicare PPO

## 2023-02-27 VITALS — BP 115/63 | HR 75 | Temp 98.6°F | Resp 18

## 2023-02-27 DIAGNOSIS — D693 Immune thrombocytopenic purpura: Secondary | ICD-10-CM

## 2023-02-27 LAB — CBC WITH DIFFERENTIAL (CANCER CENTER ONLY)
Abs Immature Granulocytes: 0.12 10*3/uL — ABNORMAL HIGH (ref 0.00–0.07)
Basophils Absolute: 0 10*3/uL (ref 0.0–0.1)
Basophils Relative: 0 %
Eosinophils Absolute: 0.1 10*3/uL (ref 0.0–0.5)
Eosinophils Relative: 1 %
HCT: 47.4 % — ABNORMAL HIGH (ref 36.0–46.0)
Hemoglobin: 16 g/dL — ABNORMAL HIGH (ref 12.0–15.0)
Immature Granulocytes: 1 %
Lymphocytes Relative: 11 %
Lymphs Abs: 1.4 10*3/uL (ref 0.7–4.0)
MCH: 31.4 pg (ref 26.0–34.0)
MCHC: 33.8 g/dL (ref 30.0–36.0)
MCV: 93.1 fL (ref 80.0–100.0)
Monocytes Absolute: 1.2 10*3/uL — ABNORMAL HIGH (ref 0.1–1.0)
Monocytes Relative: 10 %
Neutro Abs: 9.3 10*3/uL — ABNORMAL HIGH (ref 1.7–7.7)
Neutrophils Relative %: 77 %
Platelet Count: 124 10*3/uL — ABNORMAL LOW (ref 150–400)
RBC: 5.09 MIL/uL (ref 3.87–5.11)
RDW: 12.5 % (ref 11.5–15.5)
WBC Count: 12 10*3/uL — ABNORMAL HIGH (ref 4.0–10.5)
nRBC: 0 % (ref 0.0–0.2)

## 2023-02-27 LAB — HEPATITIS B SURFACE ANTIGEN: Hepatitis B Surface Ag: NONREACTIVE

## 2023-02-27 LAB — HEPATITIS B CORE ANTIBODY, TOTAL: Hep B Core Total Ab: REACTIVE — AB

## 2023-02-27 MED ORDER — ACETAMINOPHEN 325 MG PO TABS: 650.0000 mg | ORAL_TABLET | Freq: Once | ORAL | Status: DC

## 2023-02-27 MED ORDER — SODIUM CHLORIDE 0.9 % IV SOLN
375.0000 mg/m2 | Freq: Once | INTRAVENOUS | Status: DC
  Filled 2023-02-27: qty 70

## 2023-02-27 MED ORDER — SODIUM CHLORIDE 0.9 % IV SOLN
375.0000 mg/m2 | Freq: Once | INTRAVENOUS | Status: AC
  Administered 2023-02-27: 700 mg via INTRAVENOUS
  Filled 2023-02-27: qty 50

## 2023-02-27 MED ORDER — SODIUM CHLORIDE 0.9 % IV SOLN: Freq: Once | INTRAVENOUS | Status: AC

## 2023-02-27 MED ORDER — DIPHENHYDRAMINE HCL 25 MG PO CAPS
50.0000 mg | ORAL_CAPSULE | Freq: Once | ORAL | Status: AC
  Administered 2023-02-27: 25 mg via ORAL
  Filled 2023-02-27: qty 2

## 2023-02-27 NOTE — Patient Instructions (Signed)
Rituximab Injection What is this medication? RITUXIMAB (ri TUX i mab) treats leukemia and lymphoma. It works by blocking a protein that causes cancer cells to grow and multiply. This helps to slow or stop the spread of cancer cells. It may also be used to treat autoimmune conditions, such as arthritis. It works by slowing down an overactive immune system. It is a monoclonal antibody. This medicine may be used for other purposes; ask your health care provider or pharmacist if you have questions. COMMON BRAND NAME(S): RIABNI, Rituxan, RUXIENCE, truxima What should I tell my care team before I take this medication? They need to know if you have any of these conditions: Chest pain Heart disease Immune system problems Infection, such as chickenpox, cold sores, hepatitis B, herpes Irregular heartbeat or rhythm Kidney disease Low blood counts, such as low white cells, platelets, red cells Lung disease Recent or upcoming vaccine An unusual or allergic reaction to rituximab, other medications, foods, dyes, or preservatives Pregnant or trying to get pregnant Breast-feeding How should I use this medication? This medication is injected into a vein. It is given by a care team in a hospital or clinic setting. A special MedGuide will be given to you before each treatment. Be sure to read this information carefully each time. Talk to your care team about the use of this medication in children. While this medication may be prescribed for children as young as 6 months for selected conditions, precautions do apply. Overdosage: If you think you have taken too much of this medicine contact a poison control center or emergency room at once. NOTE: This medicine is only for you. Do not share this medicine with others. What if I miss a dose? Keep appointments for follow-up doses. It is important not to miss your dose. Call your care team if you are unable to keep an appointment. What may interact with this  medication? Do not take this medication with any of the following: Live vaccines This medication may also interact with the following: Cisplatin This list may not describe all possible interactions. Give your health care provider a list of all the medicines, herbs, non-prescription drugs, or dietary supplements you use. Also tell them if you smoke, drink alcohol, or use illegal drugs. Some items may interact with your medicine. What should I watch for while using this medication? Your condition will be monitored carefully while you are receiving this medication. You may need blood work while taking this medication. This medication can cause serious infusion reactions. To reduce the risk your care team may give you other medications to take before receiving this one. Be sure to follow the directions from your care team. This medication may increase your risk of getting an infection. Call your care team for advice if you get a fever, chills, sore throat, or other symptoms of a cold or flu. Do not treat yourself. Try to avoid being around people who are sick. Call your care team if you are around anyone with measles, chickenpox, or if you develop sores or blisters that do not heal properly. Avoid taking medications that contain aspirin, acetaminophen, ibuprofen, naproxen, or ketoprofen unless instructed by your care team. These medications may hide a fever. This medication may cause serious skin reactions. They can happen weeks to months after starting the medication. Contact your care team right away if you notice fevers or flu-like symptoms with a rash. The rash may be red or purple and then turn into blisters or peeling of the skin.   You may also notice a red rash with swelling of the face, lips, or lymph nodes in your neck or under your arms. In some patients, this medication may cause a serious brain infection that may cause death. If you have any problems seeing, thinking, speaking, walking, or  standing, tell your care team right away. If you cannot reach your care team, urgently seek another source of medical care. Talk to your care team if you may be pregnant. Serious birth defects can occur if you take this medication during pregnancy and for 12 months after the last dose. You will need a negative pregnancy test before starting this medication. Contraception is recommended while taking this medication and for 12 months after the last dose. Your care team can help you find the option that works for you. Do not breastfeed while taking this medication and for at least 6 months after the last dose. What side effects may I notice from receiving this medication? Side effects that you should report to your care team as soon as possible: Allergic reactions or angioedema--skin rash, itching or hives, swelling of the face, eyes, lips, tongue, arms, or legs, trouble swallowing or breathing Bowel blockage--stomach cramping, unable to have a bowel movement or pass gas, loss of appetite, vomiting Dizziness, loss of balance or coordination, confusion or trouble speaking Heart attack--pain or tightness in the chest, shoulders, arms, or jaw, nausea, shortness of breath, cold or clammy skin, feeling faint or lightheaded Heart rhythm changes--fast or irregular heartbeat, dizziness, feeling faint or lightheaded, chest pain, trouble breathing Infection--fever, chills, cough, sore throat, wounds that don't heal, pain or trouble when passing urine, general feeling of discomfort or being unwell Infusion reactions--chest pain, shortness of breath or trouble breathing, feeling faint or lightheaded Kidney injury--decrease in the amount of urine, swelling of the ankles, hands, or feet Liver injury--right upper belly pain, loss of appetite, nausea, light-colored stool, dark yellow or brown urine, yellowing skin or eyes, unusual weakness or fatigue Redness, blistering, peeling, or loosening of the skin, including  inside the mouth Stomach pain that is severe, does not go away, or gets worse Tumor lysis syndrome (TLS)--nausea, vomiting, diarrhea, decrease in the amount of urine, dark urine, unusual weakness or fatigue, confusion, muscle pain or cramps, fast or irregular heartbeat, joint pain Side effects that usually do not require medical attention (report to your care team if they continue or are bothersome): Headache Joint pain Nausea Runny or stuffy nose Unusual weakness or fatigue This list may not describe all possible side effects. Call your doctor for medical advice about side effects. You may report side effects to FDA at 1-800-FDA-1088. Where should I keep my medication? This medication is given in a hospital or clinic. It will not be stored at home. NOTE: This sheet is a summary. It may not cover all possible information. If you have questions about this medicine, talk to your doctor, pharmacist, or health care provider.  2024 Elsevier/Gold Standard (2021-11-16 00:00:00)  

## 2023-03-01 ENCOUNTER — Encounter: Payer: Self-pay | Admitting: Family

## 2023-03-04 ENCOUNTER — Ambulatory Visit (HOSPITAL_COMMUNITY)
Admission: RE | Admit: 2023-03-04 | Discharge: 2023-03-04 | Disposition: A | Discharge status: Home / Self Care | Payer: Medicare PPO | Source: Ambulatory Visit | Attending: Hematology & Oncology | Admitting: Hematology & Oncology

## 2023-03-04 DIAGNOSIS — K8020 Calculus of gallbladder without cholecystitis without obstruction: Secondary | ICD-10-CM | POA: Diagnosis not present

## 2023-03-04 DIAGNOSIS — N281 Cyst of kidney, acquired: Secondary | ICD-10-CM | POA: Diagnosis not present

## 2023-03-04 DIAGNOSIS — K7689 Other specified diseases of liver: Secondary | ICD-10-CM | POA: Diagnosis not present

## 2023-03-04 DIAGNOSIS — N289 Disorder of kidney and ureter, unspecified: Secondary | ICD-10-CM | POA: Diagnosis not present

## 2023-03-04 DIAGNOSIS — N2889 Other specified disorders of kidney and ureter: Secondary | ICD-10-CM | POA: Insufficient documentation

## 2023-03-04 MED ORDER — GADOBUTROL 1 MMOL/ML IV SOLN
8.0000 mL | Freq: Once | INTRAVENOUS | Status: AC | PRN
  Administered 2023-03-04: 8 mL via INTRAVENOUS

## 2023-03-06 ENCOUNTER — Inpatient Hospital Stay: Payer: Medicare PPO

## 2023-03-06 ENCOUNTER — Inpatient Hospital Stay: Payer: Medicare PPO | Admitting: Hematology & Oncology

## 2023-03-06 ENCOUNTER — Encounter: Payer: Self-pay | Admitting: Hematology & Oncology

## 2023-03-06 ENCOUNTER — Other Ambulatory Visit: Payer: Self-pay

## 2023-03-06 VITALS — BP 140/75 | HR 80 | Temp 98.5°F | Resp 18

## 2023-03-06 DIAGNOSIS — D693 Immune thrombocytopenic purpura: Secondary | ICD-10-CM

## 2023-03-06 LAB — CMP (CANCER CENTER ONLY)
ALT: 6 U/L (ref 0–44)
AST: 23 U/L (ref 15–41)
Albumin: 4.1 g/dL (ref 3.5–5.0)
Alkaline Phosphatase: 44 U/L (ref 38–126)
Anion gap: 8 (ref 5–15)
BUN: 11 mg/dL (ref 8–23)
CO2: 25 mmol/L (ref 22–32)
Calcium: 9.4 mg/dL (ref 8.9–10.3)
Chloride: 103 mmol/L (ref 98–111)
Creatinine: 0.86 mg/dL (ref 0.44–1.00)
GFR, Estimated: >60 mL/min (ref >60)
Glucose, Bld: 104 mg/dL — ABNORMAL HIGH (ref 70–99)
Potassium: 4 mmol/L (ref 3.5–5.1)
Sodium: 136 mmol/L (ref 135–145)
Total Bilirubin: 1.2 mg/dL (ref 0.3–1.2)
Total Protein: 7.1 g/dL (ref 6.5–8.1)

## 2023-03-06 LAB — CBC WITH DIFFERENTIAL (CANCER CENTER ONLY)
Abs Immature Granulocytes: 0.04 K/uL (ref 0.00–0.07)
Basophils Absolute: 0.1 K/uL (ref 0.0–0.1)
Basophils Relative: 1 %
Eosinophils Absolute: 0.1 K/uL (ref 0.0–0.5)
Eosinophils Relative: 1 %
HCT: 45.1 % (ref 36.0–46.0)
Hemoglobin: 14.8 g/dL (ref 12.0–15.0)
Immature Granulocytes: 1 %
Lymphocytes Relative: 18 %
Lymphs Abs: 1.2 K/uL (ref 0.7–4.0)
MCH: 31.1 pg (ref 26.0–34.0)
MCHC: 32.8 g/dL (ref 30.0–36.0)
MCV: 94.7 fL (ref 80.0–100.0)
Monocytes Absolute: 0.7 K/uL (ref 0.1–1.0)
Monocytes Relative: 11 %
Neutro Abs: 4.5 K/uL (ref 1.7–7.7)
Neutrophils Relative %: 68 %
Platelet Count: 141 K/uL — ABNORMAL LOW (ref 150–400)
RBC: 4.76 MIL/uL (ref 3.87–5.11)
RDW: 12.2 % (ref 11.5–15.5)
WBC Count: 6.6 K/uL (ref 4.0–10.5)
nRBC: 0 % (ref 0.0–0.2)

## 2023-03-06 LAB — LACTATE DEHYDROGENASE: LDH: 147 U/L (ref 98–192)

## 2023-03-06 LAB — SAVE SMEAR(SSMR), FOR PROVIDER SLIDE REVIEW

## 2023-03-06 MED ORDER — ACETAMINOPHEN 325 MG PO TABS: 650.0000 mg | ORAL_TABLET | Freq: Once | ORAL | Status: DC

## 2023-03-06 MED ORDER — SODIUM CHLORIDE 0.9 % IV SOLN
40.0000 mg | Freq: Once | INTRAVENOUS | Status: AC
  Administered 2023-03-06: 40 mg via INTRAVENOUS
  Filled 2023-03-06: qty 4

## 2023-03-06 MED ORDER — SODIUM CHLORIDE 0.9 % IV SOLN: 375.0000 mg/m2 | Freq: Once | INTRAVENOUS | Status: DC

## 2023-03-06 MED ORDER — SODIUM CHLORIDE 0.9 % IV SOLN: Freq: Once | INTRAVENOUS | Status: AC

## 2023-03-06 MED ORDER — SODIUM CHLORIDE 0.9 % IV SOLN
375.0000 mg/m2 | Freq: Once | INTRAVENOUS | Status: AC
  Administered 2023-03-06: 700 mg via INTRAVENOUS
  Filled 2023-03-06: qty 50

## 2023-03-06 MED ORDER — DIPHENHYDRAMINE HCL 25 MG PO CAPS: 50.0000 mg | ORAL_CAPSULE | Freq: Once | ORAL | Status: DC

## 2023-03-06 NOTE — Patient Instructions (Signed)
[facility]  Discharge Instructions: Thank you for choosing [facility] to provide your oncology and hematology care.   If you have a lab appointment with the Cancer Center, please go directly to the Cancer Center and check in at the registration area.  Wear comfortable clothing and clothing appropriate for easy access to any Portacath or PICC line.   We strive to give you quality time with your provider. You may need to reschedule your appointment if you arrive late (15 or more minutes).  Arriving late affects you and other patients whose appointments are after yours.  Also, if you miss three or more appointments without notifying the office, you may be dismissed from the clinic at the provider's discretion.      For prescription refill requests, have your pharmacy contact our office and allow 72 hours for refills to be completed.    Today you received the following chemotherapy and/or immunotherapy agents Rituxan       To help prevent nausea and vomiting after your treatment, we encourage you to take your nausea medication as directed.  BELOW ARE SYMPTOMS THAT SHOULD BE REPORTED IMMEDIATELY: *FEVER GREATER THAN 100.4 F (38 C) OR HIGHER *CHILLS OR SWEATING *NAUSEA AND VOMITING THAT IS NOT CONTROLLED WITH YOUR NAUSEA MEDICATION *UNUSUAL SHORTNESS OF BREATH *UNUSUAL BRUISING OR BLEEDING *URINARY PROBLEMS (pain or burning when urinating, or frequent urination) *BOWEL PROBLEMS (unusual diarrhea, constipation, pain near the anus) TENDERNESS IN MOUTH AND THROAT WITH OR WITHOUT PRESENCE OF ULCERS (sore throat, sores in mouth, or a toothache) UNUSUAL RASH, SWELLING OR PAIN  UNUSUAL VAGINAL DISCHARGE OR ITCHING   Items with * indicate a potential emergency and should be followed up as soon as possible or go to the Emergency Department if any problems should occur.  Please show the CHEMOTHERAPY ALERT CARD or IMMUNOTHERAPY ALERT CARD at check-in  to the Emergency Department and triage nurse. Should you have questions after your visit or need to cancel or reschedule your appointment, please contact [facility]  [phone] and follow the prompts.  Office hours are 8:00 a.m. to 4:30 p.m. Monday - Friday. Please note that voicemails left after 4:00 p.m. may not be returned until the following business day.  We are closed weekends and major holidays. You have access to a nurse at all times for urgent questions. Please call the main number to the clinic [phone] and follow the prompts.  For any non-urgent questions, you may also contact your provider using MyChart. We now offer e-Visits for anyone 18 and older to request care online for non-urgent symptoms. For details visit [email or web address].   Also download the MyChart app! Go to the app store, search "MyChart", open the app, select [facility], and log in with your MyChart username and password.   

## 2023-03-06 NOTE — Progress Notes (Signed)
Hematology and Oncology Follow Up Visit  Dominique Griffin [id number] [birth year] 72 y.o. 03/06/2023   Principle Diagnosis:  ITP-relapsed   Current Therapy:        Prednisone 80 mg po q day --taper down to 5 mg p.o. daily on 01/07/2023 Decadron 40 mg p.o. daily x 4 days-start 02/20/2023 IVIG 1 g/kg x 1 day-given on 02/22/2023 Rituxan 375 mg/m weekly x 4 weeks-start on 02/27/2023   Interim History:  Dominique Griffin is here today for follow-up.  She is doing quite nicely.  She has responded already.  I think the suspect that the response is probably from the steroids that she has received.  She has had 1 dose of Rituxan.  She tolerated this well.  I still think 4 cycles of Rituxan should be able to help maintain her platelet count..  She has had no bleeding.  She has a couple bruises but these appear to be improving.  She has had a good appetite.  There is been no nausea or vomiting.  She has had no cough or shortness of breath.  She has had no change in bowel or bladder habits.  She has had no rashes.  She did have MRI because of our evaluation of a renal lesion.  This is not been read yet.  Overall, I would say performance status is probably ECOG 1.        Medications:  Allergies as of 03/06/2023   No Known Allergies      Medication List        Accurate as of March 06, 2023  8:03 AM. If you have any questions, ask your nurse or doctor.          STOP taking these medications    predniSONE 20 MG tablet Commonly known as: DELTASONE Stopped by: Josph Macho   pseudoephedrine 120 MG 12 hr tablet Commonly known as: SUDAFED Stopped by: Josph Macho       TAKE these medications    Carbidopa-Levodopa ER 25-100 MG tablet controlled release Commonly known as: SINEMET CR Take 1 tablet by mouth 3 (three) times daily.   carbidopa-levodopa 25-100 MG tablet Commonly known as: SINEMET IR Take 1 tablet by mouth 3 (three) times daily.   Cholecalciferol 50 MCG (2000 UT)  Tabs Take by mouth.   cyanocobalamin 1000 MCG tablet Commonly known as: VITAMIN B12 Take 5,000 mcg by mouth daily.   fluconazole 100 MG tablet Commonly known as: DIFLUCAN Take 1 tablet (100 mg total) by mouth daily.   pantoprazole 40 MG tablet Commonly known as: PROTONIX Take 1 tablet (40 mg total) by mouth daily.        Allergies: No Known Allergies  Past Medical History, Surgical history, Social history, and Family History were reviewed and updated.  Review of Systems: Review of Systems  Constitutional: Negative.   HENT: Negative.    Eyes: Negative.   Respiratory: Negative.    Cardiovascular: Negative.   Gastrointestinal: Negative.   Genitourinary: Negative.   Musculoskeletal: Negative.   Skin: Negative.   Neurological:  Positive for tremors.  Endo/Heme/Allergies: Negative.   Psychiatric/Behavioral: Negative.        Physical Exam:  height is 5\' 6"  (1.676 m) and weight is 176 lb 12.8 oz (80.2 kg). Her oral temperature is 98.8 F (37.1 C). Her blood pressure is 131/64 and her pulse is 85. Her respiration is 18 and oxygen saturation is 100%.   Wt Readings from Last 3 Encounters:  03/06/23 176 lb 12.8  oz (80.2 kg)  02/21/23 178 lb (80.7 kg)  02/20/23 178 lb (80.7 kg)   Physical Exam Vitals reviewed.  HENT:     Head: Normocephalic and atraumatic.  Eyes:     Pupils: Pupils are equal, round, and reactive to light.  Cardiovascular:     Rate and Rhythm: Normal rate and regular rhythm.     Heart sounds: Normal heart sounds.  Pulmonary:     Effort: Pulmonary effort is normal.     Breath sounds: Normal breath sounds.  Abdominal:     General: Bowel sounds are normal.     Palpations: Abdomen is soft.  Musculoskeletal:        General: No tenderness or deformity. Normal range of motion.     Cervical back: Normal range of motion.  Lymphadenopathy:     Cervical: No cervical adenopathy.  Skin:    General: Skin is warm and dry.     Findings: No erythema or rash.   Neurological:     Mental Status: She is alert and oriented to person, place, and time.  Psychiatric:        Behavior: Behavior normal.        Thought Content: Thought content normal.        Judgment: Judgment normal.      Lab Results  Component Value Date   WBC 6.6 03/06/2023   HGB 14.8 03/06/2023   HCT 45.1 03/06/2023   MCV 94.7 03/06/2023   PLT 141 (L) 03/06/2023   Lab Results  Component Value Date   FERRITIN 41 09/07/2022   IRON 120 09/07/2022   TIBC 396 09/07/2022   UIBC 276 09/07/2022   IRONPCTSAT 30 09/07/2022   Lab Results  Component Value Date   RETICCTPCT 1.5 09/07/2022   RBC 4.76 03/06/2023   No results found for: "KPAFRELGTCHN", "LAMBDASER", "KAPLAMBRATIO" No results found for: "IGGSERUM", "IGA", "IGMSERUM" Lab Results  Component Value Date   TOTALPROTELP 6.5 09/07/2022   ALBUMINELP 4.1 09/07/2022   A1GS 0.3 09/07/2022   A2GS 0.6 09/07/2022   BETS 1.0 09/07/2022   GAMS 0.5 09/07/2022   MSPIKE Not Observed 09/07/2022   SPEI Comment 09/07/2022     Chemistry      Component Value Date/Time   NA 135 02/20/2023 1203   K 4.9 02/20/2023 1203   CL 100 02/20/2023 1203   CO2 28 02/20/2023 1203   BUN 11 02/20/2023 1203   CREATININE 0.74 02/20/2023 1203      Component Value Date/Time   CALCIUM 9.2 02/20/2023 1203   ALKPHOS 48 02/20/2023 1203   AST 11 (L) 02/20/2023 1203   ALT <5 02/20/2023 1203   BILITOT 1.5 (H) 02/20/2023 1203       Impression and Plan:    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  She was initially diagnosed earlier this year.  We had her on a prednisone taper.  She now is relapsed.  She will continue on the Rituxan.  I will give her Decadron with it Rituxan.  I think the combination would certainly be helpful.  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  She does have I think Parkinson's.  She is going to see Neurology  about this.  I do not see a problem with her being on anything for Parkinson's.  Will plan to have her come back to see [place] in a week.  Marland Kitchen  Josph Macho, MD 8/28/20248:03 AM

## 2023-03-08 NOTE — Progress Notes (Unsigned)
Assessment/Plan:   1.  Parkinsons disease, diagnosed late, 2020  -Started levodopa in December, 2023  -she participated in the TEMPO tavapadon drug trial at [facility].  Data has since been published showing positive results from this trial with significant increase in total on time without troublesome dyskinesia.  It is a D1/D5 partial receptor agonist.  -Patient is on a somewhat strange combination of immediate and extended release levodopa, but I cannot deny that she looked well today.  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  She does not disagree.  With having to take the steroids with the ITP, things have been a bit confusing in terms of what symptoms have been from what disease/medication.  -She is currently taking Carbidopa/levodopa 25/100, 1 tablet at 7am, 1 tablet at 10am, 1.5 tablets 2-3pm, and then takes 1 tablet at 6pm prn but she takes that regularly -She also takes carbidopa/levodopa 25/100 CR 2 tablets at 7am, 1 tablet at 10/11am,  -Patient does have some afternoon nausea around 2 PM and she is not sure if it is from the immediate release levodopa or not.  I tend to think it would not be given that she has no other nausea with the other dosages, but I told her to first take it with carbohydrate and see how she does.  If she still has some nausea, I told her to substitute the extended release for the immediate release at the 2 PM dose and see how she does.  If she continues to have nausea, we may try to change to Rytary at some point in time.  She is a little bit nervous about the cost, although I have samples for right now.  This is going off patent. -Patient is consistently involved with Parkinson's related exercise.  Congratulated her for that. -Information given on community resources and invited her to the symposium.  2.  ITP  -Follows with Dr. Myna Hidalgo.  Has had several courses of IVIG.  Is now  on rituximab.  3.  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  MRI at the onset was unremarkable.  Subjective:   Dominique Griffin was seen today in the movement disorders clinic for neurologic consultation at the request of Winona Legato, PA.  The consultation is for the evaluation of Parkinsons disease. Husband with patient and supplements hx.   Medical records made available to me are reviewed.  Patient has been cared for at [facility] for many years, although saw [facility] early on.  Medical records made available to me are reviewed.  She was diagnosed with Parkinsons disease in late 2020 with early symptoms being right sided leg tremor and bradykinesia.  She took no medication until she started first started participating in the TEMPO trial in October, 2022.  She really did pretty well until about June, 2023, at which point she began to develop vertigo.  In December, 2023, they decided to try levodopa just to see if that would help her feelings of instability, even though it sounded like vertigo.  This levodopa did not help with the feelings of dizziness.  She ultimately decided to try and wean off of the study medication, tavapadon.  It was also recommended that she follow-up with ENT.  Neurology felt that symptoms were  vertiginous/vestibular even though vestibular testing was unremarkable.  Neurology also noted that Dix-Hallpike maneuver showed nystagmus with head turn to the right just 2 days prior to vestibular testing.  This study that she was a completed in March, 2024, but unfortunately around that time she also was diagnosed with ITP.  She has been following with oncology, Dr. Myna Hidalgo, ever since.  She has just started rituxumab (has done IVIG in the past x 3).  She last saw neurology at [facility] (the nurse practitioner) on October 19, 2022.  Current Parkinson's medications: Carbidopa/levodopa  25/100, 1 tablet at 7am, 1 tablet at 10am, 1.5 tablets 2-3pm, and then takes 1 tablet at 6pm prn but she takes that regularly Carbidopa/levodopa 25/100 CR 2 tablets at 7am, 1 tablet at 10/11am,    Specific Symptoms:  Tremor: Yes.  , started in R leg but its pretty controlled Family hx of similar:  Yes.  , maternal GM with hx of Parkinsons Disease  Voice: sounds good without change Sleep: sleeps well  Vivid Dreams:  No.  Acting out dreams:  a little yelling lately Wet Pillows: No. Postural symptoms:  No. Per pt but husband thinks its a "major problem" but its because of vertigo; has to use the trekking poles in late afternoon if turning head.  She relates use of trekking poles to the ITP  Falls?  No. Bradykinesia symptoms: slow movements; no shuffling Loss of smell:  No. Loss of taste:  No. Urinary Incontinence:  No. Difficulty Swallowing:  No. Handwriting, micrographia: No. Trouble with ADL's:  No.  Trouble buttoning clothing: No. Depression:  No. But admits to anxiety Memory changes:  No. Hallucinations:  No.  visual distortions: No. N/V:  Yes.  , has consistent afternoon nausea Lightheaded:  No.  Syncope: No. Diplopia:  No. Dyskinesia:  Yes.   Prior exposure to reglan/antipsychotics: No.  Neuroimaging of the brain has previously been performed.  It is not available for my review today.  I do have the report.  Patient had MRI of the brain September 26, 2022.  This reported to show mild white matter disease and either a small calcification or microhemorrhage in the right cerebellar hemisphere that was punctate.  PREVIOUS MEDICATIONS: Sinemet and Sinemet CR; tavapadon drug trial  ALLERGIES:  No Known Allergies  CURRENT MEDICATIONS:  Current Meds  Medication Sig   carbidopa-levodopa (SINEMET IR) 25-100 MG tablet Take 1 tablet by mouth 3 (three) times daily. Take 1 tab at 7Am / Take 1 tablet at 10-11 Take 1 tablet at 6PM PRN   Carbidopa-Levodopa ER (SINEMET CR) 25-100 MG tablet  controlled release Take 1 tablet by mouth 3 (three) times daily. Take 2 tablets at 7Am / Take 1 tablet 10-11 / Take 1.5 tablet at 2-3   cyanocobalamin (VITAMIN B12) 1000 MCG tablet Take 5,000 mcg by mouth daily.   fluconazole (DIFLUCAN) 100 MG tablet Take 1 tablet (100 mg total) by mouth daily.   pantoprazole (PROTONIX) 40 MG tablet Take 1 tablet (40 mg total) by mouth daily.   riTUXimab (RITUXAN) 100 MG/10ML injection Inject into the vein.     Objective:   VITALS:   Vitals:   03/13/23 1016  BP: 136/82  Pulse: 72  SpO2: 98%  Weight: 178 lb 12.8 oz (81.1 kg)  Height: 5\' 6"  (1.676 m)    GEN:  The patient appears stated age and is in NAD. HEENT:  Normocephalic, atraumatic.  The mucous membranes are moist. The superficial temporal arteries are without ropiness or tenderness.  CV:  RRR Lungs:  CTAB Neck/HEME:  There are no carotid bruits bilaterally.  Neurological examination:  Orientation: The patient is alert and oriented x3.  Cranial nerves: There is good facial symmetry. No hypomimia.  Extraocular muscles are intact. The visual fields are full to confrontational testing. The speech is fluent and clear. Soft palate rises symmetrically and there is no tongue deviation. Hearing is intact to conversational tone. Sensation: Sensation is intact to light and pinprick throughout (facial, trunk, extremities). Vibration is intact at the bilateral big toe. There is no extinction with double simultaneous stimulation. There is no sensory dermatomal level identified. Motor: Strength is 5/5 in the bilateral upper and lower extremities.   Shoulder shrug is equal and symmetric.  There is no pronator drift. Deep tendon reflexes: Deep tendon reflexes are 2/4 at the bilateral biceps, triceps, brachioradialis, patella and achilles. Plantar responses are downgoing bilaterally.  Movement examination: Tone: There is nl tone in the bilateral upper extremities.  The tone in the lower extremities is nl.   Abnormal movements: there is min dyskinesia in the L foot and R hand Coordination:  There is no decremation with RAM's, with any form of RAMS, including alternating supination and pronation of the forearm, hand opening and closing, finger taps, heel taps and toe taps.  Gait and Station: The patient has now difficulty arising out of a deep-seated chair without the use of the hands. The patient's stride length is good.  The patient has a negative pull test.     I have reviewed and interpreted the following labs independently   Chemistry      Component Value Date/Time   NA 136 03/06/2023 0743   K 4.0 03/06/2023 0743   CL 103 03/06/2023 0743   CO2 25 03/06/2023 0743   BUN 11 03/06/2023 0743   CREATININE 0.86 03/06/2023 0743      Component Value Date/Time   CALCIUM 9.4 03/06/2023 0743   ALKPHOS 44 03/06/2023 0743   AST 23 03/06/2023 0743   ALT 6 03/06/2023 0743   BILITOT 1.2 03/06/2023 0743      Lab Results  Component Value Date   TSH 2.38 10/02/2021   Lab Results  Component Value Date   WBC 6.6 03/06/2023   HGB 14.8 03/06/2023   HCT 45.1 03/06/2023   MCV 94.7 03/06/2023   PLT 141 (L) 03/06/2023     Total time spent on today's visit was 80 minutes, including both face-to-face time and nonface-to-face time.  Time included that spent on review of records (prior notes available to me/labs/imaging if pertinent), discussing treatment and goals, answering patient's questions and coordinating care.  Cc:  Pincus Sanes, MD

## 2023-03-12 ENCOUNTER — Encounter: Payer: Self-pay | Admitting: *Deleted

## 2023-03-13 ENCOUNTER — Ambulatory Visit: Payer: Medicare PPO | Admitting: Neurology

## 2023-03-13 ENCOUNTER — Encounter: Payer: Self-pay | Admitting: Neurology

## 2023-03-13 VITALS — BP 136/82 | HR 72 | Ht 66.0 in | Wt 178.8 lb

## 2023-03-13 DIAGNOSIS — D693 Immune thrombocytopenic purpura: Secondary | ICD-10-CM | POA: Diagnosis not present

## 2023-03-13 DIAGNOSIS — G20B2 Parkinson's disease with dyskinesia, with fluctuations: Secondary | ICD-10-CM

## 2023-03-13 DIAGNOSIS — R42 Dizziness and giddiness: Secondary | ICD-10-CM | POA: Diagnosis not present

## 2023-03-13 NOTE — Patient Instructions (Signed)
SAVE THE DATE!  We are planning a Parkinsons Disease educational symposium at [facility] in [place] on October 11.  More details to come!  If you would like to be added to our email list to get further information, email [email or web address].  To sign up, you can email [email or web address].  We hope to see you there!  The physicians and staff at [facility] are committed to providing excellent care. You may receive a survey requesting feedback about your experience at our office. We strive to receive "very good" responses to the survey questions. If you feel that your experience would prevent you from giving the office a "very good " response, please contact our office to try to remedy the situation. We may be reached at [phone]. Thank you for taking the time out of your busy day to complete the survey.

## 2023-03-14 ENCOUNTER — Inpatient Hospital Stay: Payer: Medicare PPO

## 2023-03-14 ENCOUNTER — Inpatient Hospital Stay: Payer: Medicare PPO | Attending: Hematology & Oncology

## 2023-03-14 VITALS — BP 122/62 | HR 86 | Temp 98.6°F | Resp 18

## 2023-03-14 DIAGNOSIS — D693 Immune thrombocytopenic purpura: Secondary | ICD-10-CM | POA: Diagnosis not present

## 2023-03-14 LAB — CBC WITH DIFFERENTIAL (CANCER CENTER ONLY)
Abs Immature Granulocytes: 0.01 10*3/uL (ref 0.00–0.07)
Basophils Absolute: 0.1 10*3/uL (ref 0.0–0.1)
Basophils Relative: 2 %
Eosinophils Absolute: 0.1 10*3/uL (ref 0.0–0.5)
Eosinophils Relative: 2 %
HCT: 42.6 % (ref 36.0–46.0)
Hemoglobin: 14.3 g/dL (ref 12.0–15.0)
Immature Granulocytes: 0 %
Lymphocytes Relative: 14 %
Lymphs Abs: 1 10*3/uL (ref 0.7–4.0)
MCH: 31.6 pg (ref 26.0–34.0)
MCHC: 33.6 g/dL (ref 30.0–36.0)
MCV: 94 fL (ref 80.0–100.0)
Monocytes Absolute: 0.8 10*3/uL (ref 0.1–1.0)
Monocytes Relative: 11 %
Neutro Abs: 4.9 10*3/uL (ref 1.7–7.7)
Neutrophils Relative %: 71 %
Platelet Count: 200 10*3/uL (ref 150–400)
RBC: 4.53 MIL/uL (ref 3.87–5.11)
RDW: 12.5 % (ref 11.5–15.5)
WBC Count: 6.9 10*3/uL (ref 4.0–10.5)
nRBC: 0 % (ref 0.0–0.2)

## 2023-03-14 MED ORDER — ACETAMINOPHEN 325 MG PO TABS: 650.0000 mg | ORAL_TABLET | Freq: Once | ORAL | Status: DC

## 2023-03-14 MED ORDER — DIPHENHYDRAMINE HCL 25 MG PO CAPS: 50.0000 mg | ORAL_CAPSULE | Freq: Once | ORAL | Status: DC

## 2023-03-14 MED ORDER — SODIUM CHLORIDE 0.9 % IV SOLN
375.0000 mg/m2 | Freq: Once | INTRAVENOUS | Status: AC
  Administered 2023-03-14: 700 mg via INTRAVENOUS
  Filled 2023-03-14: qty 50

## 2023-03-14 MED ORDER — SODIUM CHLORIDE 0.9 % IV SOLN: Freq: Once | INTRAVENOUS | Status: AC

## 2023-03-14 MED ORDER — SODIUM CHLORIDE 0.9 % IV SOLN
40.0000 mg | Freq: Once | INTRAVENOUS | Status: AC
  Administered 2023-03-14: 40 mg via INTRAVENOUS
  Filled 2023-03-14: qty 4

## 2023-03-14 NOTE — Patient Instructions (Signed)
[facility]  Discharge Instructions: Thank you for choosing [facility] to provide your oncology and hematology care.   If you have a lab appointment with the Cancer Center, please go directly to the Cancer Center and check in at the registration area.  Wear comfortable clothing and clothing appropriate for easy access to any Portacath or PICC line.   We strive to give you quality time with your provider. You may need to reschedule your appointment if you arrive late (15 or more minutes).  Arriving late affects you and other patients whose appointments are after yours.  Also, if you miss three or more appointments without notifying the office, you may be dismissed from the clinic at the provider's discretion.      For prescription refill requests, have your pharmacy contact our office and allow 72 hours for refills to be completed.    Today you received the following chemotherapy and/or immunotherapy agents Rituxan       To help prevent nausea and vomiting after your treatment, we encourage you to take your nausea medication as directed.  BELOW ARE SYMPTOMS THAT SHOULD BE REPORTED IMMEDIATELY: *FEVER GREATER THAN 100.4 F (38 C) OR HIGHER *CHILLS OR SWEATING *NAUSEA AND VOMITING THAT IS NOT CONTROLLED WITH YOUR NAUSEA MEDICATION *UNUSUAL SHORTNESS OF BREATH *UNUSUAL BRUISING OR BLEEDING *URINARY PROBLEMS (pain or burning when urinating, or frequent urination) *BOWEL PROBLEMS (unusual diarrhea, constipation, pain near the anus) TENDERNESS IN MOUTH AND THROAT WITH OR WITHOUT PRESENCE OF ULCERS (sore throat, sores in mouth, or a toothache) UNUSUAL RASH, SWELLING OR PAIN  UNUSUAL VAGINAL DISCHARGE OR ITCHING   Items with * indicate a potential emergency and should be followed up as soon as possible or go to the Emergency Department if any problems should occur.  Please show the CHEMOTHERAPY ALERT CARD or IMMUNOTHERAPY ALERT CARD at check-in  to the Emergency Department and triage nurse. Should you have questions after your visit or need to cancel or reschedule your appointment, please contact [facility]  [phone] and follow the prompts.  Office hours are 8:00 a.m. to 4:30 p.m. Monday - Friday. Please note that voicemails left after 4:00 p.m. may not be returned until the following business day.  We are closed weekends and major holidays. You have access to a nurse at all times for urgent questions. Please call the main number to the clinic [phone] and follow the prompts.  For any non-urgent questions, you may also contact your provider using MyChart. We now offer e-Visits for anyone 18 and older to request care online for non-urgent symptoms. For details visit [email or web address].   Also download the MyChart app! Go to the app store, search "MyChart", open the app, select [facility], and log in with your MyChart username and password.   

## 2023-03-18 ENCOUNTER — Other Ambulatory Visit: Payer: Self-pay | Admitting: Internal Medicine

## 2023-03-18 DIAGNOSIS — Z1231 Encounter for screening mammogram for malignant neoplasm of breast: Secondary | ICD-10-CM

## 2023-03-20 ENCOUNTER — Encounter: Payer: Self-pay | Admitting: Neurology

## 2023-03-20 ENCOUNTER — Inpatient Hospital Stay: Payer: Medicare PPO | Admitting: Hematology & Oncology

## 2023-03-20 ENCOUNTER — Inpatient Hospital Stay: Payer: Medicare PPO

## 2023-03-20 ENCOUNTER — Encounter: Payer: Self-pay | Admitting: Hematology & Oncology

## 2023-03-20 VITALS — BP 124/71 | HR 78 | Resp 20

## 2023-03-20 DIAGNOSIS — D693 Immune thrombocytopenic purpura: Secondary | ICD-10-CM

## 2023-03-20 LAB — CMP (CANCER CENTER ONLY)
ALT: <5 U/L (ref 0–44)
AST: 15 U/L (ref 15–41)
Albumin: 4.1 g/dL (ref 3.5–5.0)
Alkaline Phosphatase: 57 U/L (ref 38–126)
Anion gap: 9 (ref 5–15)
BUN: 10 mg/dL (ref 8–23)
CO2: 25 mmol/L (ref 22–32)
Calcium: 9.2 mg/dL (ref 8.9–10.3)
Chloride: 103 mmol/L (ref 98–111)
Creatinine: 0.8 mg/dL (ref 0.44–1.00)
GFR, Estimated: >60 mL/min (ref >60)
Glucose, Bld: 94 mg/dL (ref 70–99)
Potassium: 4.4 mmol/L (ref 3.5–5.1)
Sodium: 137 mmol/L (ref 135–145)
Total Bilirubin: 0.9 mg/dL (ref 0.3–1.2)
Total Protein: 6.7 g/dL (ref 6.5–8.1)

## 2023-03-20 LAB — CBC WITH DIFFERENTIAL (CANCER CENTER ONLY)
Abs Immature Granulocytes: 0.04 10*3/uL (ref 0.00–0.07)
Basophils Absolute: 0.1 10*3/uL (ref 0.0–0.1)
Basophils Relative: 1 %
Eosinophils Absolute: 0.2 10*3/uL (ref 0.0–0.5)
Eosinophils Relative: 3 %
HCT: 43.4 % (ref 36.0–46.0)
Hemoglobin: 14.4 g/dL (ref 12.0–15.0)
Immature Granulocytes: 1 %
Lymphocytes Relative: 12 %
Lymphs Abs: 1 10*3/uL (ref 0.7–4.0)
MCH: 31 pg (ref 26.0–34.0)
MCHC: 33.2 g/dL (ref 30.0–36.0)
MCV: 93.5 fL (ref 80.0–100.0)
Monocytes Absolute: 0.9 10*3/uL (ref 0.1–1.0)
Monocytes Relative: 10 %
Neutro Abs: 6.5 10*3/uL (ref 1.7–7.7)
Neutrophils Relative %: 73 %
Platelet Count: 276 10*3/uL (ref 150–400)
RBC: 4.64 MIL/uL (ref 3.87–5.11)
RDW: 12.7 % (ref 11.5–15.5)
WBC Count: 8.7 10*3/uL (ref 4.0–10.5)
nRBC: 0 % (ref 0.0–0.2)

## 2023-03-20 LAB — SAVE SMEAR(SSMR), FOR PROVIDER SLIDE REVIEW

## 2023-03-20 LAB — LACTATE DEHYDROGENASE: LDH: 152 U/L (ref 98–192)

## 2023-03-20 MED ORDER — SODIUM CHLORIDE 0.9 % IV SOLN
40.0000 mg | Freq: Once | INTRAVENOUS | Status: AC
  Administered 2023-03-20: 40 mg via INTRAVENOUS
  Filled 2023-03-20: qty 4

## 2023-03-20 MED ORDER — ACETAMINOPHEN 325 MG PO TABS: 650.0000 mg | ORAL_TABLET | Freq: Once | ORAL | Status: DC

## 2023-03-20 MED ORDER — SODIUM CHLORIDE 0.9 % IV SOLN
375.0000 mg/m2 | Freq: Once | INTRAVENOUS | Status: AC
  Administered 2023-03-20: 700 mg via INTRAVENOUS
  Filled 2023-03-20: qty 50

## 2023-03-20 MED ORDER — DIPHENHYDRAMINE HCL 25 MG PO CAPS: 50.0000 mg | ORAL_CAPSULE | Freq: Once | ORAL | Status: DC

## 2023-03-20 MED ORDER — SODIUM CHLORIDE 0.9 % IV SOLN: Freq: Once | INTRAVENOUS | Status: AC

## 2023-03-20 NOTE — Progress Notes (Signed)
Hematology and Oncology Follow Up Visit  Dominique Griffin [id number] Feb 01, 1951 72 y.o. 03/20/2023   Principle Diagnosis:  ITP-relapsed   Current Therapy:        Prednisone 80 mg po q day --taper down to 5 mg p.o. daily on 01/07/2023 Decadron 40 mg p.o. daily x 4 days-start 02/20/2023 IVIG 1 g/kg x 1 day-given on 02/22/2023 Rituxan 375 mg/m weekly x 4 weeks-start on 02/27/2023   Interim History:  Dominique Griffin is here today for follow-up.  She is doing quite nicely.  Her platelet count continues to go up.  She is really done very well.  Her platelet count has rebounded so nicely in 1 month.  She will get her last Rituxan today.  After today, we will just watch her.  We will have her labs done every 2 weeks.  She has had no problems with bleeding or bruising.  There has been no cough or shortness of breath.  She has had no nausea or vomiting.  She has had no change in bowel or bladder habits.  There has been no rashes.  She has had no leg swelling.  She did have an MRI that was done.  This is because a ultrasound that we did showed a lesion in the left kidney.  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  Currently, I would have said that her performance status is probably ECOG 1.         Medications:  Allergies as of 03/20/2023   No Known Allergies      Medication List        Accurate as of March 20, 2023  8:56 AM. If you have any questions, ask your nurse or doctor.          acetaminophen 325 MG tablet Commonly known as: TYLENOL Take 650 mg by mouth every 6 (six) hours as needed.   Carbidopa-Levodopa ER 25-100 MG tablet controlled release Commonly known as: SINEMET CR Take 1 tablet by mouth 3 (three) times daily. Take 2 tablets at 7Am / Take 1 tablet 10-11 /   carbidopa-levodopa 25-100 MG tablet Commonly known as: SINEMET IR Take 1 tablet by mouth 3 (three) times daily. Take 1 tab at 7Am / Take 1  tablet at 10-11 Take 1.5 tablet at 2-3   Take 1 tablet at 6PM PRN   Cholecalciferol 50 MCG (2000 UT) Tabs Take by mouth daily.   cyanocobalamin 1000 MCG tablet Commonly known as: VITAMIN B12 Take 5,000 mcg by mouth daily.   diphenhydrAMINE 25 mg capsule Commonly known as: BENADRYL Take 50 mg by mouth daily.   fluconazole 100 MG tablet Commonly known as: DIFLUCAN Take 1 tablet (100 mg total) by mouth daily.   pantoprazole 40 MG tablet Commonly known as: PROTONIX Take 1 tablet (40 mg total) by mouth daily.   riTUXimab 100 MG/10ML injection Commonly known as: RITUXAN Inject into the vein.        Allergies: No Known Allergies  Past Medical History, Surgical history, Social history, and Family History were reviewed and updated.  Review of Systems: Review of Systems  Constitutional: Negative.   HENT: Negative.    Eyes: Negative.   Respiratory: Negative.    Cardiovascular: Negative.   Gastrointestinal: Negative.   Genitourinary: Negative.   Musculoskeletal: Negative.   Skin: Negative.   Neurological:  Positive for tremors.  Endo/Heme/Allergies: Negative.   Psychiatric/Behavioral: Negative.  Physical Exam:  height is 5\' 6"  (1.676 m) and weight is 180 lb 6.4 oz (81.8 kg). Her oral temperature is 97.9 F (36.6 C). Her blood pressure is 130/78 and her pulse is 92. Her respiration is 20 and oxygen saturation is 100%.   Wt Readings from Last 3 Encounters:  03/20/23 180 lb 6.4 oz (81.8 kg)  03/13/23 178 lb 12.8 oz (81.1 kg)  03/06/23 176 lb 12.8 oz (80.2 kg)   Physical Exam Vitals reviewed.  HENT:     Head: Normocephalic and atraumatic.  Eyes:     Pupils: Pupils are equal, round, and reactive to light.  Cardiovascular:     Rate and Rhythm: Normal rate and regular rhythm.     Heart sounds: Normal heart sounds.  Pulmonary:     Effort: Pulmonary effort is normal.     Breath sounds: Normal breath sounds.  Abdominal:     General: Bowel sounds are normal.      Palpations: Abdomen is soft.  Musculoskeletal:        General: No tenderness or deformity. Normal range of motion.     Cervical back: Normal range of motion.  Lymphadenopathy:     Cervical: No cervical adenopathy.  Skin:    General: Skin is warm and dry.     Findings: No erythema or rash.  Neurological:     Mental Status: She is alert and oriented to person, place, and time.  Psychiatric:        Behavior: Behavior normal.        Thought Content: Thought content normal.        Judgment: Judgment normal.     Lab Results  Component Value Date   WBC 8.7 03/20/2023   HGB 14.4 03/20/2023   HCT 43.4 03/20/2023   MCV 93.5 03/20/2023   PLT 276 03/20/2023   Lab Results  Component Value Date   FERRITIN 41 09/07/2022   IRON 120 09/07/2022   TIBC 396 09/07/2022   UIBC 276 09/07/2022   IRONPCTSAT 30 09/07/2022   Lab Results  Component Value Date   RETICCTPCT 1.5 09/07/2022   RBC 4.64 03/20/2023   No results found for: "KPAFRELGTCHN", "LAMBDASER", "KAPLAMBRATIO" No results found for: "IGGSERUM", "IGA", "IGMSERUM" Lab Results  Component Value Date   TOTALPROTELP 6.5 09/07/2022   ALBUMINELP 4.1 09/07/2022   A1GS 0.3 09/07/2022   A2GS 0.6 09/07/2022   BETS 1.0 09/07/2022   GAMS 0.5 09/07/2022   MSPIKE Not Observed 09/07/2022   SPEI Comment 09/07/2022     Chemistry      Component Value Date/Time   NA 136 03/06/2023 0743   K 4.0 03/06/2023 0743   CL 103 03/06/2023 0743   CO2 25 03/06/2023 0743   BUN 11 03/06/2023 0743   CREATININE 0.86 03/06/2023 0743      Component Value Date/Time   CALCIUM 9.4 03/06/2023 0743   ALKPHOS 44 03/06/2023 0743   AST 23 03/06/2023 0743   ALT 6 03/06/2023 0743   BILITOT 1.2 03/06/2023 0743       Impression and Plan:    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  She was initially diagnosed earlier this year.  We had her on a prednisone taper.  She now has relapsed.  This will be her last dose of  Rituxan.  We will watch her after this.  I think if she does have another relapse, we will probably try her on Nplate.  I know  that had taken her spleen out probably would help immensely.  However, she has a Parkinson's and she is really worried about having surgery along with the Parkinson's.  Again, we will watch her blood work every 2 weeks.  I would like to see her back probably in about 6 weeks at which point we should hopefully still see her platelet count doing well.    Josph Macho, MD 9/11/20248:56 AM

## 2023-03-20 NOTE — Patient Instructions (Signed)
[facility]  Discharge Instructions: Thank you for choosing [facility] to provide your oncology and hematology care.   If you have a lab appointment with the Cancer Center, please go directly to the Cancer Center and check in at the registration area.  Wear comfortable clothing and clothing appropriate for easy access to any Portacath or PICC line.   We strive to give you quality time with your provider. You may need to reschedule your appointment if you arrive late (15 or more minutes).  Arriving late affects you and other patients whose appointments are after yours.  Also, if you miss three or more appointments without notifying the office, you may be dismissed from the clinic at the provider's discretion.      For prescription refill requests, have your pharmacy contact our office and allow 72 hours for refills to be completed.    Today you received the following chemotherapy and/or immunotherapy agents rituxan     To help prevent nausea and vomiting after your treatment, we encourage you to take your nausea medication as directed.  BELOW ARE SYMPTOMS THAT SHOULD BE REPORTED IMMEDIATELY: *FEVER GREATER THAN 100.4 F (38 C) OR HIGHER *CHILLS OR SWEATING *NAUSEA AND VOMITING THAT IS NOT CONTROLLED WITH YOUR NAUSEA MEDICATION *UNUSUAL SHORTNESS OF BREATH *UNUSUAL BRUISING OR BLEEDING *URINARY PROBLEMS (pain or burning when urinating, or frequent urination) *BOWEL PROBLEMS (unusual diarrhea, constipation, pain near the anus) TENDERNESS IN MOUTH AND THROAT WITH OR WITHOUT PRESENCE OF ULCERS (sore throat, sores in mouth, or a toothache) UNUSUAL RASH, SWELLING OR PAIN  UNUSUAL VAGINAL DISCHARGE OR ITCHING   Items with * indicate a potential emergency and should be followed up as soon as possible or go to the Emergency Department if any problems should occur.  Please show the CHEMOTHERAPY ALERT CARD or IMMUNOTHERAPY ALERT CARD at check-in  to the Emergency Department and triage nurse. Should you have questions after your visit or need to cancel or reschedule your appointment, please contact [facility]  [phone] and follow the prompts.  Office hours are 8:00 a.m. to 4:30 p.m. Monday - Friday. Please note that voicemails left after 4:00 p.m. may not be returned until the following business day.  We are closed weekends and major holidays. You have access to a nurse at all times for urgent questions. Please call the main number to the clinic [phone] and follow the prompts.  For any non-urgent questions, you may also contact your provider using MyChart. We now offer e-Visits for anyone 68 and older to request care online for non-urgent symptoms. For details visit [email or web address].   Also download the MyChart app! Go to the app store, search "MyChart", open the app, select [facility], and log in with your MyChart username and password.

## 2023-04-03 ENCOUNTER — Inpatient Hospital Stay: Payer: Medicare PPO

## 2023-04-03 DIAGNOSIS — D693 Immune thrombocytopenic purpura: Secondary | ICD-10-CM | POA: Diagnosis not present

## 2023-04-03 LAB — CBC WITH DIFFERENTIAL (CANCER CENTER ONLY)
Abs Immature Granulocytes: 0.03 10*3/uL (ref 0.00–0.07)
Basophils Absolute: 0.1 10*3/uL (ref 0.0–0.1)
Basophils Relative: 1 %
Eosinophils Absolute: 0.2 10*3/uL (ref 0.0–0.5)
Eosinophils Relative: 2 %
HCT: 41.9 % (ref 36.0–46.0)
Hemoglobin: 14 g/dL (ref 12.0–15.0)
Immature Granulocytes: 0 %
Lymphocytes Relative: 12 %
Lymphs Abs: 1 10*3/uL (ref 0.7–4.0)
MCH: 31.5 pg (ref 26.0–34.0)
MCHC: 33.4 g/dL (ref 30.0–36.0)
MCV: 94.4 fL (ref 80.0–100.0)
Monocytes Absolute: 0.9 10*3/uL (ref 0.1–1.0)
Monocytes Relative: 11 %
Neutro Abs: 6.1 10*3/uL (ref 1.7–7.7)
Neutrophils Relative %: 74 %
Platelet Count: 259 10*3/uL (ref 150–400)
RBC: 4.44 MIL/uL (ref 3.87–5.11)
RDW: 12.6 % (ref 11.5–15.5)
WBC Count: 8.3 10*3/uL (ref 4.0–10.5)
nRBC: 0 % (ref 0.0–0.2)

## 2023-04-17 ENCOUNTER — Inpatient Hospital Stay: Payer: Medicare PPO | Admitting: Hematology & Oncology

## 2023-04-17 ENCOUNTER — Encounter: Payer: Self-pay | Admitting: Hematology & Oncology

## 2023-04-17 ENCOUNTER — Inpatient Hospital Stay: Payer: Medicare PPO | Attending: Hematology & Oncology

## 2023-04-17 VITALS — BP 136/72 | HR 86 | Temp 99.3°F | Resp 20 | Ht 66.0 in | Wt 184.0 lb

## 2023-04-17 DIAGNOSIS — D693 Immune thrombocytopenic purpura: Secondary | ICD-10-CM | POA: Insufficient documentation

## 2023-04-17 DIAGNOSIS — Z7952 Long term (current) use of systemic steroids: Secondary | ICD-10-CM | POA: Insufficient documentation

## 2023-04-17 LAB — CBC WITH DIFFERENTIAL (CANCER CENTER ONLY)
Abs Immature Granulocytes: 0.02 10*3/uL (ref 0.00–0.07)
Basophils Absolute: 0.1 10*3/uL (ref 0.0–0.1)
Basophils Relative: 1 %
Eosinophils Absolute: 0.2 10*3/uL (ref 0.0–0.5)
Eosinophils Relative: 2 %
HCT: 43 % (ref 36.0–46.0)
Hemoglobin: 14.2 g/dL (ref 12.0–15.0)
Immature Granulocytes: 0 %
Lymphocytes Relative: 15 %
Lymphs Abs: 1 10*3/uL (ref 0.7–4.0)
MCH: 30.8 pg (ref 26.0–34.0)
MCHC: 33 g/dL (ref 30.0–36.0)
MCV: 93.3 fL (ref 80.0–100.0)
Monocytes Absolute: 0.8 10*3/uL (ref 0.1–1.0)
Monocytes Relative: 12 %
Neutro Abs: 4.5 10*3/uL (ref 1.7–7.7)
Neutrophils Relative %: 70 %
Platelet Count: 296 10*3/uL (ref 150–400)
RBC: 4.61 MIL/uL (ref 3.87–5.11)
RDW: 12.5 % (ref 11.5–15.5)
WBC Count: 6.6 10*3/uL (ref 4.0–10.5)
nRBC: 0 % (ref 0.0–0.2)

## 2023-04-17 LAB — CMP (CANCER CENTER ONLY)
ALT: 5 U/L (ref 0–44)
AST: 13 U/L — ABNORMAL LOW (ref 15–41)
Albumin: 4.2 g/dL (ref 3.5–5.0)
Alkaline Phosphatase: 54 U/L (ref 38–126)
Anion gap: 7 (ref 5–15)
BUN: 7 mg/dL — ABNORMAL LOW (ref 8–23)
CO2: 29 mmol/L (ref 22–32)
Calcium: 9.4 mg/dL (ref 8.9–10.3)
Chloride: 105 mmol/L (ref 98–111)
Creatinine: 0.72 mg/dL (ref 0.44–1.00)
GFR, Estimated: >60 mL/min (ref >60)
Glucose, Bld: 82 mg/dL (ref 70–99)
Potassium: 4.6 mmol/L (ref 3.5–5.1)
Sodium: 141 mmol/L (ref 135–145)
Total Bilirubin: 1.3 mg/dL — ABNORMAL HIGH (ref 0.3–1.2)
Total Protein: 6.6 g/dL (ref 6.5–8.1)

## 2023-04-17 LAB — SAVE SMEAR(SSMR), FOR PROVIDER SLIDE REVIEW

## 2023-04-17 LAB — LACTATE DEHYDROGENASE: LDH: 149 U/L (ref 98–192)

## 2023-04-17 NOTE — Progress Notes (Signed)
Hematology and Oncology Follow Up Visit  Dominique Griffin [id number] Apr 15, 1951 72 y.o. 04/17/2023   Principle Diagnosis:  ITP-relapsed   Current Therapy:        Prednisone 80 mg po q day --taper down to 5 mg p.o. daily on 01/07/2023 Decadron 40 mg p.o. daily x 4 days-start 02/20/2023 IVIG 1 g/kg x 1 day-given on 02/22/2023 Rituxan 375 mg/m weekly x 4 weeks-start on 02/27/2023   Interim History:  Dominique Griffin is here today for follow-up.  She looks fantastic.  She feels well.  She has had no problems with nausea or vomiting.  She has had no bleeding or bruising.  Response to IVIG/steroids/Rituxan has been superb.  Today, her platelet count is about 300,000.  She has had no fever.  She has had no rashes.  There is been no leg swelling.  Her appetite has been quite good.  With the Fall weather, she likes to be outside.  Hopefully should be able to walk their dog quite a bit.  Overall, I would say performance status is probably ECOG 0.           Medications:  Allergies as of 04/17/2023   No Known Allergies      Medication List        Accurate as of April 17, 2023 10:06 AM. If you have any questions, ask your nurse or doctor.          STOP taking these medications    acetaminophen 325 MG tablet Commonly known as: TYLENOL Stopped by: Josph Macho   diphenhydrAMINE 25 mg capsule Commonly known as: BENADRYL Stopped by: Josph Macho   pantoprazole 40 MG tablet Commonly known as: PROTONIX Stopped by: Josph Macho   riTUXimab 100 MG/10ML injection Commonly known as: RITUXAN Stopped by: Josph Macho       TAKE these medications    Carbidopa-Levodopa ER 25-100 MG tablet controlled release Commonly known as: SINEMET CR Take 1 tablet by mouth 3 (three) times daily.   carbidopa-levodopa 25-100 MG tablet Commonly known as: SINEMET IR Take 1 tablet by mouth 3 (three) times daily.   Cholecalciferol 50 MCG (2000 UT) Tabs Take by mouth daily.    cyanocobalamin 1000 MCG tablet Commonly known as: VITAMIN B12 Take 5,000 mcg by mouth daily.   fluconazole 100 MG tablet Commonly known as: DIFLUCAN Take 1 tablet (100 mg total) by mouth daily.   naproxen 250 MG tablet Commonly known as: NAPROSYN 2 (two) times daily as needed.        Allergies: No Known Allergies  Past Medical History, Surgical history, Social history, and Family History were reviewed and updated.  Review of Systems: Review of Systems  Constitutional: Negative.   HENT: Negative.    Eyes: Negative.   Respiratory: Negative.    Cardiovascular: Negative.   Gastrointestinal: Negative.   Genitourinary: Negative.   Musculoskeletal: Negative.   Skin: Negative.   Neurological:  Positive for tremors.  Endo/Heme/Allergies: Negative.   Psychiatric/Behavioral: Negative.        Physical Exam:  height is 5\' 6"  (1.676 m) and weight is 184 lb (83.5 kg). Her oral temperature is 99.3 F (37.4 C). Her blood pressure is 136/72 and her pulse is 86. Her respiration is 20 and oxygen saturation is 99%.   Wt Readings from Last 3 Encounters:  04/17/23 184 lb (83.5 kg)  03/20/23 180 lb 6.4 oz (81.8 kg)  03/13/23 178 lb 12.8 oz (81.1 kg)   Physical Exam Vitals reviewed.  HENT:     Head: Normocephalic and atraumatic.  Eyes:     Pupils: Pupils are equal, round, and reactive to light.  Cardiovascular:     Rate and Rhythm: Normal rate and regular rhythm.     Heart sounds: Normal heart sounds.  Pulmonary:     Effort: Pulmonary effort is normal.     Breath sounds: Normal breath sounds.  Abdominal:     General: Bowel sounds are normal.     Palpations: Abdomen is soft.  Musculoskeletal:        General: No tenderness or deformity. Normal range of motion.     Cervical back: Normal range of motion.  Lymphadenopathy:     Cervical: No cervical adenopathy.  Skin:    General: Skin is warm and dry.     Findings: No erythema or rash.  Neurological:     Mental Status: She is  alert and oriented to person, place, and time.  Psychiatric:        Behavior: Behavior normal.        Thought Content: Thought content normal.        Judgment: Judgment normal.      Lab Results  Component Value Date   WBC 6.6 04/17/2023   HGB 14.2 04/17/2023   HCT 43.0 04/17/2023   MCV 93.3 04/17/2023   PLT 296 04/17/2023   Lab Results  Component Value Date   FERRITIN 41 09/07/2022   IRON 120 09/07/2022   TIBC 396 09/07/2022   UIBC 276 09/07/2022   IRONPCTSAT 30 09/07/2022   Lab Results  Component Value Date   RETICCTPCT 1.5 09/07/2022   RBC 4.61 04/17/2023   No results found for: "KPAFRELGTCHN", "LAMBDASER", "KAPLAMBRATIO" No results found for: "IGGSERUM", "IGA", "IGMSERUM" Lab Results  Component Value Date   TOTALPROTELP 6.5 09/07/2022   ALBUMINELP 4.1 09/07/2022   A1GS 0.3 09/07/2022   A2GS 0.6 09/07/2022   BETS 1.0 09/07/2022   GAMS 0.5 09/07/2022   MSPIKE Not Observed 09/07/2022   SPEI Comment 09/07/2022     Chemistry      Component Value Date/Time   NA 141 04/17/2023 0900   K 4.6 04/17/2023 0900   CL 105 04/17/2023 0900   CO2 29 04/17/2023 0900   BUN 7 (L) 04/17/2023 0900   CREATININE 0.72 04/17/2023 0900      Component Value Date/Time   CALCIUM 9.4 04/17/2023 0900   ALKPHOS 54 04/17/2023 0900   AST 13 (L) 04/17/2023 0900   ALT 5 04/17/2023 0900   BILITOT 1.3 (H) 04/17/2023 0900       Impression and Plan:    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  She was initially diagnosed earlier this year.  We had her on a prednisone taper.  She now has relapsed.  Hopefully, she will be in remission for quite a while.  Time will tell.  Thankfully, her Parkinson's has not been a problem for her.  I am happy about that.  At this point in time, I do not think we need to do any routine lab work.  I think we just get her back in 6 weeks.  We will try to get her back before the Holiday season and then get her through the  Holidays.    Josph Macho, MD 10/9/202410:06 AM

## 2023-04-18 ENCOUNTER — Encounter: Payer: Self-pay | Admitting: Hematology & Oncology

## 2023-05-17 ENCOUNTER — Ambulatory Visit
Admission: RE | Admit: 2023-05-17 | Discharge: 2023-05-17 | Disposition: A | Discharge status: Home / Self Care | Payer: Medicare PPO | Source: Ambulatory Visit | Attending: Internal Medicine | Admitting: Internal Medicine

## 2023-05-17 DIAGNOSIS — Z1231 Encounter for screening mammogram for malignant neoplasm of breast: Secondary | ICD-10-CM | POA: Diagnosis not present

## 2023-05-29 ENCOUNTER — Ambulatory Visit: Payer: Medicare PPO | Admitting: Hematology & Oncology

## 2023-05-29 ENCOUNTER — Inpatient Hospital Stay: Payer: Medicare PPO

## 2023-05-31 ENCOUNTER — Inpatient Hospital Stay: Payer: Medicare PPO | Admitting: Hematology & Oncology

## 2023-05-31 ENCOUNTER — Encounter: Payer: Self-pay | Admitting: Hematology & Oncology

## 2023-05-31 ENCOUNTER — Inpatient Hospital Stay: Payer: Medicare PPO | Attending: Hematology & Oncology

## 2023-05-31 VITALS — BP 128/66 | HR 80 | Temp 98.3°F | Resp 18 | Ht 66.0 in | Wt 188.1 lb

## 2023-05-31 DIAGNOSIS — D693 Immune thrombocytopenic purpura: Secondary | ICD-10-CM | POA: Diagnosis not present

## 2023-05-31 LAB — CBC WITH DIFFERENTIAL (CANCER CENTER ONLY)
Abs Immature Granulocytes: 0.02 10*3/uL (ref 0.00–0.07)
Basophils Absolute: 0.1 10*3/uL (ref 0.0–0.1)
Basophils Relative: 1 %
Eosinophils Absolute: 0.2 10*3/uL (ref 0.0–0.5)
Eosinophils Relative: 2 %
HCT: 41.5 % (ref 36.0–46.0)
Hemoglobin: 13.9 g/dL (ref 12.0–15.0)
Immature Granulocytes: 0 %
Lymphocytes Relative: 16 %
Lymphs Abs: 1.2 10*3/uL (ref 0.7–4.0)
MCH: 30.8 pg (ref 26.0–34.0)
MCHC: 33.5 g/dL (ref 30.0–36.0)
MCV: 91.8 fL (ref 80.0–100.0)
Monocytes Absolute: 0.9 10*3/uL (ref 0.1–1.0)
Monocytes Relative: 12 %
Neutro Abs: 5.1 10*3/uL (ref 1.7–7.7)
Neutrophils Relative %: 69 %
Platelet Count: 266 10*3/uL (ref 150–400)
RBC: 4.52 MIL/uL (ref 3.87–5.11)
RDW: 11.9 % (ref 11.5–15.5)
WBC Count: 7.6 10*3/uL (ref 4.0–10.5)
nRBC: 0 % (ref 0.0–0.2)

## 2023-05-31 LAB — CMP (CANCER CENTER ONLY)
ALT: <5 U/L (ref 0–44)
AST: 13 U/L — ABNORMAL LOW (ref 15–41)
Albumin: 4.4 g/dL (ref 3.5–5.0)
Alkaline Phosphatase: 57 U/L (ref 38–126)
Anion gap: 8 (ref 5–15)
BUN: 11 mg/dL (ref 8–23)
CO2: 29 mmol/L (ref 22–32)
Calcium: 9.7 mg/dL (ref 8.9–10.3)
Chloride: 102 mmol/L (ref 98–111)
Creatinine: 0.74 mg/dL (ref 0.44–1.00)
GFR, Estimated: >60 mL/min (ref >60)
Glucose, Bld: 102 mg/dL — ABNORMAL HIGH (ref 70–99)
Potassium: 4.8 mmol/L (ref 3.5–5.1)
Sodium: 139 mmol/L (ref 135–145)
Total Bilirubin: 1.4 mg/dL — ABNORMAL HIGH (ref <1.2)
Total Protein: 6.5 g/dL (ref 6.5–8.1)

## 2023-05-31 LAB — SAVE SMEAR(SSMR), FOR PROVIDER SLIDE REVIEW

## 2023-05-31 NOTE — Progress Notes (Signed)
Hematology and Oncology Follow Up Visit  Dominique Griffin [id number] 06-Nov-1950 72 y.o. 05/31/2023   Principle Diagnosis:  ITP-relapsed   Current Therapy:        Prednisone 80 mg po q day --taper down to 5 mg p.o. daily on 01/07/2023 Decadron 40 mg p.o. daily x 4 days-start 02/20/2023 IVIG 1 g/kg x 1 day-given on 02/22/2023 Rituxan 375 mg/m weekly x 4 weeks-start on 02/27/2023   Interim History:  Dominique Griffin is here today for follow-up.  She looks fantastic.  She feels well.  She and her husband are getting ready for the Holiday season.  It sounds that they will be having quite 1 by themselves for Thanksgiving and then probably being with family for Christmas.  She has had no problems with bleeding or bruising.  She has had no issues with nausea or vomiting.  She has had no change in bowel or bladder habits.  There is been no problems with rashes.  She has had no fever.  She has had no problems with nausea or vomiting.  She has had no bleeding or bruising.  Of note, she had a mammogram on 05/17/2023.  This looked fine.  Overall, I would say that her performance status is ECOG 0.    Medications:  Allergies as of 05/31/2023   No Known Allergies      Medication List        Accurate as of May 31, 2023  1:04 PM. If you have any questions, ask your nurse or doctor.          STOP taking these medications    fluconazole 100 MG tablet Commonly known as: DIFLUCAN Stopped by: Josph Macho       TAKE these medications    Carbidopa-Levodopa ER 25-100 MG tablet controlled release Commonly known as: SINEMET CR Take 1 tablet by mouth 3 (three) times daily.   carbidopa-levodopa 25-100 MG tablet Commonly known as: SINEMET IR Take 1 tablet by mouth 3 (three) times daily.   Cholecalciferol 50 MCG (2000 UT) Tabs Take by mouth daily.   cyanocobalamin 1000 MCG tablet Commonly known as: VITAMIN B12 Take 5,000 mcg by mouth daily.   naproxen 250 MG tablet Commonly known  as: NAPROSYN 2 (two) times daily as needed.        Allergies: No Known Allergies  Past Medical History, Surgical history, Social history, and Family History were reviewed and updated.  Review of Systems: Review of Systems  Constitutional: Negative.   HENT: Negative.    Eyes: Negative.   Respiratory: Negative.    Cardiovascular: Negative.   Gastrointestinal: Negative.   Genitourinary: Negative.   Musculoskeletal: Negative.   Skin: Negative.   Neurological:  Positive for tremors.  Endo/Heme/Allergies: Negative.   Psychiatric/Behavioral: Negative.        Physical Exam:  height is 5\' 6"  (1.676 m) and weight is 188 lb 1.9 oz (85.3 kg). Her oral temperature is 98.3 F (36.8 C). Her blood pressure is 128/66 and her pulse is 80. Her respiration is 18 and oxygen saturation is 100%.   Wt Readings from Last 3 Encounters:  05/31/23 188 lb 1.9 oz (85.3 kg)  04/17/23 184 lb (83.5 kg)  03/20/23 180 lb 6.4 oz (81.8 kg)   Physical Exam Vitals reviewed.  HENT:     Head: Normocephalic and atraumatic.  Eyes:     Pupils: Pupils are equal, round, and reactive to light.  Cardiovascular:     Rate and Rhythm: Normal rate and regular  rhythm.     Heart sounds: Normal heart sounds.  Pulmonary:     Effort: Pulmonary effort is normal.     Breath sounds: Normal breath sounds.  Abdominal:     General: Bowel sounds are normal.     Palpations: Abdomen is soft.  Musculoskeletal:        General: No tenderness or deformity. Normal range of motion.     Cervical back: Normal range of motion.  Lymphadenopathy:     Cervical: No cervical adenopathy.  Skin:    General: Skin is warm and dry.     Findings: No erythema or rash.  Neurological:     Mental Status: She is alert and oriented to person, place, and time.  Psychiatric:        Behavior: Behavior normal.        Thought Content: Thought content normal.        Judgment: Judgment normal.      Lab Results  Component Value Date   WBC 7.6  05/31/2023   HGB 13.9 05/31/2023   HCT 41.5 05/31/2023   MCV 91.8 05/31/2023   PLT 266 05/31/2023   Lab Results  Component Value Date   FERRITIN 41 09/07/2022   IRON 120 09/07/2022   TIBC 396 09/07/2022   UIBC 276 09/07/2022   IRONPCTSAT 30 09/07/2022   Lab Results  Component Value Date   RETICCTPCT 1.5 09/07/2022   RBC 4.52 05/31/2023   No results found for: "KPAFRELGTCHN", "LAMBDASER", "KAPLAMBRATIO" No results found for: "IGGSERUM", "IGA", "IGMSERUM" Lab Results  Component Value Date   TOTALPROTELP 6.5 09/07/2022   ALBUMINELP 4.1 09/07/2022   A1GS 0.3 09/07/2022   A2GS 0.6 09/07/2022   BETS 1.0 09/07/2022   GAMS 0.5 09/07/2022   MSPIKE Not Observed 09/07/2022   SPEI Comment 09/07/2022     Chemistry      Component Value Date/Time   NA 139 05/31/2023 1157   K 4.8 05/31/2023 1157   CL 102 05/31/2023 1157   CO2 29 05/31/2023 1157   BUN 11 05/31/2023 1157   CREATININE 0.74 05/31/2023 1157      Component Value Date/Time   CALCIUM 9.7 05/31/2023 1157   ALKPHOS 57 05/31/2023 1157   AST 13 (L) 05/31/2023 1157   ALT <5 05/31/2023 1157   BILITOT 1.4 (H) 05/31/2023 1157       Impression and Plan:    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  She was initially diagnosed earlier this year.  We had her on a prednisone taper.  She she subsequently had relapsed.  We treated her with IVIG and Rituxan.  She has had a very nice response.  Hopefully, she will be in remission for quite a while.  Time will tell.  For right now, we can now get her through the Christmas season.  Will plan to get her back in January 2025.   Josph Macho, MD 11/22/20241:04 PM

## 2023-06-03 ENCOUNTER — Encounter: Payer: Self-pay | Admitting: Hematology & Oncology

## 2023-06-13 DIAGNOSIS — G20A2 Parkinson's disease without dyskinesia, with fluctuations: Secondary | ICD-10-CM | POA: Diagnosis not present

## 2023-06-28 ENCOUNTER — Ambulatory Visit (HOSPITAL_COMMUNITY)
Admission: RE | Admit: 2023-06-28 | Discharge: 2023-06-28 | Disposition: A | Discharge status: Home / Self Care | Payer: Medicare PPO | Source: Ambulatory Visit | Attending: Nurse Practitioner | Admitting: Nurse Practitioner

## 2023-06-28 ENCOUNTER — Encounter (HOSPITAL_COMMUNITY): Payer: Self-pay

## 2023-06-28 ENCOUNTER — Ambulatory Visit (INDEPENDENT_AMBULATORY_CARE_PROVIDER_SITE_OTHER): Payer: Medicare PPO

## 2023-06-28 VITALS — BP 134/77 | HR 90 | Temp 98.6°F | Resp 17

## 2023-06-28 DIAGNOSIS — S93509A Unspecified sprain of unspecified toe(s), initial encounter: Secondary | ICD-10-CM

## 2023-06-28 DIAGNOSIS — M85872 Other specified disorders of bone density and structure, left ankle and foot: Secondary | ICD-10-CM | POA: Diagnosis not present

## 2023-06-28 DIAGNOSIS — M79675 Pain in left toe(s): Secondary | ICD-10-CM

## 2023-06-28 NOTE — Discharge Instructions (Addendum)
I will contact you later today if the x-ray shows there are any broken bones in your foot.  In the meantime, recommend keeping the foot elevated, you can apply ice 15 minutes on, 45 minutes off every hour while awake, take Tylenol for pain.  Recommend follow-up if pain does not improve after 1 to 2 weeks.

## 2023-06-28 NOTE — ED Triage Notes (Signed)
Pt presents with soreness and inflammation on left big toe that happened Thursday when she overextend toe while walking at home

## 2023-06-28 NOTE — ED Provider Notes (Signed)
[facility]    CSN: [id number] Arrival date & time: 06/28/23  1247      History   Chief Complaint Chief Complaint  Patient presents with   Appointment   Toe Injury    HPI Dominique Griffin is a 72 y.o. female.   Patient presents today with 2-day history of left great toe pain.  Reports that she was walking in the hallway at home, she "rolled" her toe.  She did not fall or step her toe into anything.  Reports area has been a little bit red, but no bruising or swelling.  Reports that hurts when she bears weight or when she pushes on it, but no pain with movement.  No bruising, numbness or tingling of the toe.  Reports symptoms improve when she rests or when she wears a hard soled shoe.    Past Medical History:  Diagnosis Date   Gilbert's syndrome    History of ITP    Osteopenia    Parkinson disease (HCC)     Patient Active Problem List   Diagnosis Date Noted   B12 deficiency 01/07/2023   Dizziness - PPPD 10/24/2022   Hyperglycemia 10/23/2022   Acute ITP (HCC) 09/07/2022   Clinical trial exam 06/27/2021   Osteopenia 11/13/2020   Vitamin D deficiency 11/13/2020   Diverticulosis of colon 11/13/2020   Family history of colon cancer 11/13/2020   Arthritis 09/27/2020   [place] disease 09/27/2020   Coccyx pain 09/27/2020   Parkinson's disease - Neurology at [facility] 12/02/2019   Chronic fatigue 05/29/2019    Past Surgical History:  Procedure Laterality Date   ABDOMINAL HYSTERECTOMY  1993   fibroids, ovaries remain   BREAST EXCISIONAL BIOPSY Left    skin bbiopsy   TONSILLECTOMY      OB History     Gravida  0   Para  0   Term  0   Preterm  0   AB  0   Living  0      SAB  0   IAB  0   Ectopic  0   Multiple  0   Live Births  0            Home Medications    Prior to Admission medications   Medication Sig Start Date End Date Taking? Authorizing Provider  carbidopa-levodopa (SINEMET IR) 25-100 MG tablet Take 1 tablet by mouth 3  (three) times daily. 12/16/22   [provider]  Carbidopa-Levodopa ER (SINEMET CR) 25-100 MG tablet controlled release Take 1 tablet by mouth 3 (three) times daily. 10/23/22 10/23/23  [provider]  Cholecalciferol 50 MCG (2000 UT) TABS Take by mouth daily.    [provider]  cyanocobalamin (VITAMIN B12) 1000 MCG tablet Take 5,000 mcg by mouth daily. 02/20/23   [provider]  naproxen (NAPROSYN) 250 MG tablet 2 (two) times daily as needed. 03/25/23   [provider]    Family History Family History  Problem Relation Age of Onset   Breast cancer Mother    Stroke Mother    Breast cancer Maternal Aunt    Parkinson's disease Maternal Grandmother     Social History Social History   Tobacco Use   Smoking status: Former    Types: Cigarettes   Smokeless tobacco: Never   Tobacco comments:    smoked minimally  Vaping Use   Vaping status: Never Used  Substance Use Topics   Alcohol use: Not Currently   Drug use: Never  Allergies   Patient has no known allergies.   Review of Systems Review of Systems Per HPI  Physical Exam Triage Vital Signs ED Triage Vitals  Encounter Vitals Group     BP 06/28/23 1342 134/77     Systolic BP Percentile --      Diastolic BP Percentile --      Pulse Rate 06/28/23 1342 90     Resp 06/28/23 1342 17     Temp 06/28/23 1342 98.6 F (37 C)     Temp Source 06/28/23 1342 Oral     SpO2 06/28/23 1342 99 %     Weight --      Height --      Head Circumference --      Peak Flow --      Pain Score 06/28/23 1340 4     Pain Loc --      Pain Education --      Exclude from Growth Chart --    No data found.  Updated Vital Signs BP 134/77 (BP Location: Right Arm)   Pulse 90   Temp 98.6 F (37 C) (Oral)   Resp 17   SpO2 99%   Visual Acuity Right Eye Distance:   Left Eye Distance:   Bilateral Distance:    Right Eye Near:   Left Eye Near:    Bilateral Near:     Physical Exam Vitals and  nursing note reviewed.  Constitutional:      General: She is not in acute distress.    Appearance: Normal appearance. She is not toxic-appearing.  HENT:     Mouth/Throat:     Mouth: Mucous membranes are moist.     Pharynx: Oropharynx is clear.  Pulmonary:     Effort: Pulmonary effort is normal. No respiratory distress.  Musculoskeletal:       Feet:     Comments: Inspection: mild erythema noted to left dorsal great toe in approximately area marked; no swelling, bruising, obvious deformity Palpation: tender to palpation to left first MTP; no obvious deformities palpated; no pain to light touch of skin ROM: Full ROM to left first digit Strength: 5/5 left great first digit Neurovascular: neurovascularly intact in distal left first digit   Skin:    General: Skin is warm and dry.     Capillary Refill: Capillary refill takes less than 2 seconds.     Coloration: Skin is not jaundiced or pale.     Findings: No erythema.  Neurological:     Mental Status: She is alert and oriented to person, place, and time.  Psychiatric:        Behavior: Behavior is cooperative.      UC Treatments / Results  Labs (all labs ordered are listed, but only abnormal results are displayed) Labs Reviewed - No data to display  EKG   Radiology DG Foot Complete Left Result Date: 06/28/2023 CLINICAL DATA:  Pain in the left great toe. EXAM: LEFT FOOT - COMPLETE 3+ VIEW COMPARISON:  None Available. FINDINGS: No acute fracture or dislocation. The bones are osteopenic. The soft tissues are unremarkable. IMPRESSION: 1. No acute fracture or dislocation. 2. Osteopenia. Electronically Signed   By: Elgie Collard M.D.   On: 06/28/2023 15:55    Procedures Procedures (including critical care time)  Medications Ordered in UC Medications - No data to display  Initial Impression / Assessment and Plan / UC Course  I have reviewed the triage vital signs and the nursing notes.  Pertinent labs &  imaging results that  were available during my care of the patient were reviewed by me and considered in my medical decision making (see chart for details).   Patient is well-appearing, normotensive, afebrile, not tachycardic, not tachypneic, oxygenating well on room air.    1. Toe pain, left 2. Sprain of toe, initial encounter No red flags on exam or in history Suspect possible strain X-ray imaging at time of discharge, will contact patient if abnormal The meantime, supportive care discussed including RICE method, hard soled shoe to help provide support Tylenol/ibuprofen as needed for pain Return and ER precautions discussed  Addendum: Patient was notified by MyChart that x-ray imaging is negative for acute bony abnormality today  The patient was given the opportunity to ask questions.  All questions answered to their satisfaction.  The patient is in agreement to this plan.    Final Clinical Impressions(s) / UC Diagnoses   Final diagnoses:  Toe pain, left  Sprain of toe, initial encounter     Discharge Instructions      I will contact you later today if the x-ray shows there are any broken bones in your foot.  In the meantime, recommend keeping the foot elevated, you can apply ice 15 minutes on, 45 minutes off every hour while awake, take Tylenol for pain.  Recommend follow-up if pain does not improve after 1 to 2 weeks.    ED Prescriptions   None    PDMP not reviewed this encounter.   Valentino Nose, NP 06/28/23 [phone]

## 2023-07-01 DIAGNOSIS — D3132 Benign neoplasm of left choroid: Secondary | ICD-10-CM | POA: Diagnosis not present

## 2023-07-01 DIAGNOSIS — H524 Presbyopia: Secondary | ICD-10-CM | POA: Diagnosis not present

## 2023-07-19 ENCOUNTER — Inpatient Hospital Stay (HOSPITAL_BASED_OUTPATIENT_CLINIC_OR_DEPARTMENT_OTHER): Payer: Medicare PPO | Admitting: Hematology & Oncology

## 2023-07-19 ENCOUNTER — Encounter: Payer: Self-pay | Admitting: Hematology & Oncology

## 2023-07-19 ENCOUNTER — Inpatient Hospital Stay: Payer: Medicare PPO | Attending: Hematology & Oncology

## 2023-07-19 VITALS — BP 123/72 | HR 82 | Temp 98.7°F | Resp 18 | Ht 66.0 in | Wt 188.0 lb

## 2023-07-19 DIAGNOSIS — D693 Immune thrombocytopenic purpura: Secondary | ICD-10-CM

## 2023-07-19 LAB — CBC WITH DIFFERENTIAL (CANCER CENTER ONLY)
Abs Immature Granulocytes: 0.02 10*3/uL (ref 0.00–0.07)
Basophils Absolute: 0.1 10*3/uL (ref 0.0–0.1)
Basophils Relative: 1 %
Eosinophils Absolute: 0.2 10*3/uL (ref 0.0–0.5)
Eosinophils Relative: 2 %
HCT: 41.4 % (ref 36.0–46.0)
Hemoglobin: 14.2 g/dL (ref 12.0–15.0)
Immature Granulocytes: 0 %
Lymphocytes Relative: 19 %
Lymphs Abs: 1.3 10*3/uL (ref 0.7–4.0)
MCH: 31 pg (ref 26.0–34.0)
MCHC: 34.3 g/dL (ref 30.0–36.0)
MCV: 90.4 fL (ref 80.0–100.0)
Monocytes Absolute: 1 10*3/uL (ref 0.1–1.0)
Monocytes Relative: 14 %
Neutro Abs: 4.5 10*3/uL (ref 1.7–7.7)
Neutrophils Relative %: 64 %
Platelet Count: 286 10*3/uL (ref 150–400)
RBC: 4.58 MIL/uL (ref 3.87–5.11)
RDW: 12 % (ref 11.5–15.5)
WBC Count: 7.1 10*3/uL (ref 4.0–10.5)
nRBC: 0 % (ref 0.0–0.2)

## 2023-07-19 LAB — CMP (CANCER CENTER ONLY)
ALT: <5 U/L (ref 0–44)
AST: 14 U/L — ABNORMAL LOW (ref 15–41)
Albumin: 4.2 g/dL (ref 3.5–5.0)
Alkaline Phosphatase: 55 U/L (ref 38–126)
Anion gap: 9 (ref 5–15)
BUN: 11 mg/dL (ref 8–23)
CO2: 25 mmol/L (ref 22–32)
Calcium: 9.4 mg/dL (ref 8.9–10.3)
Chloride: 103 mmol/L (ref 98–111)
Creatinine: 0.82 mg/dL (ref 0.44–1.00)
GFR, Estimated: >60 mL/min (ref >60)
Glucose, Bld: 106 mg/dL — ABNORMAL HIGH (ref 70–99)
Potassium: 4.6 mmol/L (ref 3.5–5.1)
Sodium: 137 mmol/L (ref 135–145)
Total Bilirubin: 1.5 mg/dL — ABNORMAL HIGH (ref 0.0–1.2)
Total Protein: 6.4 g/dL — ABNORMAL LOW (ref 6.5–8.1)

## 2023-07-19 LAB — SAVE SMEAR(SSMR), FOR PROVIDER SLIDE REVIEW

## 2023-07-19 NOTE — Progress Notes (Signed)
 Hematology and Oncology Follow Up Visit  Dominique Griffin [id number] 1951/01/23 73 y.o. 07/19/2023   Principle Diagnosis:  ITP-relapsed   Current Therapy:        Prednisone  80 mg po q day --taper down to 5 mg p.o. daily on 01/07/2023 Decadron  40 mg p.o. daily x 4 days-start 02/20/2023 IVIG 1 g/kg x 1 day-given on 02/22/2023 Rituxan  375 mg/m weekly x 4 weeks-start on 02/27/2023   Interim History:  Dominique Griffin is here today for follow-up.  She had no problems over the holiday season.  Everything is doing quite well for her.  So happy that things are going well.  She has had no bleeding.  There is been no bruising.  She has had no cough or shortness of breath.  She has had no change in bowel or bladder habits.  She has had no fever.  Thankfully, there is been no problems with COVID.  She has had no change in medications.  She is walking.  She is trying to exercise.  She has had no leg swelling.  Overall, I would say that her performance status is probably ECOG 1.   Medications:  Allergies as of 07/19/2023   No Known Allergies      Medication List        Accurate as of July 19, 2023  1:22 PM. If you have any questions, ask your nurse or doctor.          Carbidopa-Levodopa ER 25-100 MG tablet controlled release Commonly known as: SINEMET CR Take 1 tablet by mouth 3 (three) times daily.   carbidopa-levodopa 25-100 MG tablet Commonly known as: SINEMET IR Take 1 tablet by mouth 3 (three) times daily.   Cholecalciferol 50 MCG (2000 UT) Tabs Take by mouth daily.   cyanocobalamin  1000 MCG tablet Commonly known as: VITAMIN B12 Take 5,000 mcg by mouth daily.   naproxen 250 MG tablet Commonly known as: NAPROSYN 2 (two) times daily as needed.        Allergies: No Known Allergies  Past Medical History, Surgical history, Social history, and Family History were reviewed and updated.  Review of Systems: Review of Systems  Constitutional: Negative.   HENT:  Negative.    Eyes: Negative.   Respiratory: Negative.    Cardiovascular: Negative.   Gastrointestinal: Negative.   Genitourinary: Negative.   Musculoskeletal: Negative.   Skin: Negative.   Neurological:  Positive for tremors.  Endo/Heme/Allergies: Negative.   Psychiatric/Behavioral: Negative.        Physical Exam:  height is 5' 6 (1.676 m) and weight is 188 lb (85.3 kg). Her oral temperature is 98.7 F (37.1 C). Her blood pressure is 123/72 and her pulse is 82. Her respiration is 18 and oxygen saturation is 100%.   Wt Readings from Last 3 Encounters:  07/19/23 188 lb (85.3 kg)  05/31/23 188 lb 1.9 oz (85.3 kg)  04/17/23 184 lb (83.5 kg)   Physical Exam Vitals reviewed.  HENT:     Head: Normocephalic and atraumatic.  Eyes:     Pupils: Pupils are equal, round, and reactive to light.  Cardiovascular:     Rate and Rhythm: Normal rate and regular rhythm.     Heart sounds: Normal heart sounds.  Pulmonary:     Effort: Pulmonary effort is normal.     Breath sounds: Normal breath sounds.  Abdominal:     General: Bowel sounds are normal.     Palpations: Abdomen is soft.  Musculoskeletal:  General: No tenderness or deformity. Normal range of motion.     Cervical back: Normal range of motion.  Lymphadenopathy:     Cervical: No cervical adenopathy.  Skin:    General: Skin is warm and dry.     Findings: No erythema or rash.  Neurological:     Mental Status: She is alert and oriented to person, place, and time.  Psychiatric:        Behavior: Behavior normal.        Thought Content: Thought content normal.        Judgment: Judgment normal.      Lab Results  Component Value Date   WBC 7.1 07/19/2023   HGB 14.2 07/19/2023   HCT 41.4 07/19/2023   MCV 90.4 07/19/2023   PLT 286 07/19/2023   Lab Results  Component Value Date   FERRITIN 41 09/07/2022   IRON 120 09/07/2022   TIBC 396 09/07/2022   UIBC 276 09/07/2022   IRONPCTSAT 30 09/07/2022   Lab Results   Component Value Date   RETICCTPCT 1.5 09/07/2022   RBC 4.58 07/19/2023   No results found for: KPAFRELGTCHN, LAMBDASER, KAPLAMBRATIO No results found for: KIMBERLY LE, IGMSERUM Lab Results  Component Value Date   TOTALPROTELP 6.5 09/07/2022   ALBUMINELP 4.1 09/07/2022   A1GS 0.3 09/07/2022   A2GS 0.6 09/07/2022   BETS 1.0 09/07/2022   GAMS 0.5 09/07/2022   MSPIKE Not Observed 09/07/2022   SPEI Comment 09/07/2022     Chemistry      Component Value Date/Time   NA 139 05/31/2023 1157   K 4.8 05/31/2023 1157   CL 102 05/31/2023 1157   CO2 29 05/31/2023 1157   BUN 11 05/31/2023 1157   CREATININE 0.74 05/31/2023 1157      Component Value Date/Time   CALCIUM 9.7 05/31/2023 1157   ALKPHOS 57 05/31/2023 1157   AST 13 (L) 05/31/2023 1157   ALT <5 05/31/2023 1157   BILITOT 1.4 (H) 05/31/2023 1157       Impression and Plan:    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  She was initially diagnosed earlier this year.  We had her on a prednisone  taper.  She she subsequently had relapsed.  We treated her with IVIG and Rituxan .  She has had a very nice response.  For right now, I really think that we can probably move her appointments out a little bit longer now.  And we will get her through the Winter.  I would like to plan to see her back sometime in the Spring.    Dominique JONELLE Crease, MD 1/10/20251:22 PM

## 2023-07-22 ENCOUNTER — Encounter: Payer: Self-pay | Admitting: Hematology & Oncology

## 2023-08-05 DIAGNOSIS — L570 Actinic keratosis: Secondary | ICD-10-CM | POA: Diagnosis not present

## 2023-08-05 DIAGNOSIS — L723 Sebaceous cyst: Secondary | ICD-10-CM | POA: Diagnosis not present

## 2023-08-05 DIAGNOSIS — D2261 Melanocytic nevi of right upper limb, including shoulder: Secondary | ICD-10-CM | POA: Diagnosis not present

## 2023-08-05 DIAGNOSIS — L814 Other melanin hyperpigmentation: Secondary | ICD-10-CM | POA: Diagnosis not present

## 2023-08-05 DIAGNOSIS — D2262 Melanocytic nevi of left upper limb, including shoulder: Secondary | ICD-10-CM | POA: Diagnosis not present

## 2023-08-05 DIAGNOSIS — L821 Other seborrheic keratosis: Secondary | ICD-10-CM | POA: Diagnosis not present

## 2023-08-05 DIAGNOSIS — D225 Melanocytic nevi of trunk: Secondary | ICD-10-CM | POA: Diagnosis not present

## 2023-08-05 DIAGNOSIS — D224 Melanocytic nevi of scalp and neck: Secondary | ICD-10-CM | POA: Diagnosis not present

## 2023-08-05 DIAGNOSIS — L720 Epidermal cyst: Secondary | ICD-10-CM | POA: Diagnosis not present

## 2023-09-11 NOTE — Progress Notes (Unsigned)
 Assessment/Plan:   1.  Parkinsons disease, diagnosed late, 2020             -Started levodopa in December, 2023             -she participated in the TEMPO tavapadon drug trial at [facility].  Data has since been published showing positive results from this trial with significant increase in total on time without troublesome dyskinesia.  It is a D1/D5 partial receptor agonist.             -Patient is on a somewhat strange combination of immediate and extended release levodopa, but I cannot deny that she looked well today.  She has actually backed down on her dosing since last visit.  She does note that it takes 1-1/2 hours for medication to peak and may not last the entire time, but overall she is happy with the dosing.  I told her to make sure she is not underdosing herself.  Overall, she did actually look pretty good today even on the lower dose.  She is currently taking carbidopa/levodopa 25/100, 1 at 8 AM/0.5 tablets at 11:30 AM and 0.5 tablets at 3:30 PM.  -Patient also takes carbidopa/levodopa 25/100 CR, 1 tablet 3 times per day at the same dosage times as the above.             -We discussed Rytary and Crexont.  I think Crexont may be a little expensive, but Rytary may go generic in July and could be an option then.  We can discuss further at next visit. -Patient continues to exercise regularly.  I am proud of her. -We discussed Vyalev, which is foscarbidopa/foslevodopa pump that is newly FDA approved.  We discussed that it is for motor fluctuations in adults with advanced Parkinson's disease.  We discussed that this likely will not be on Medicare formulary until the latter half of 2025.  We discussed risks and benefits of this drug.   2.  ITP             -Follows with Dr. Myna Hidalgo.  Has had several courses of IVIG and rituximab in past but monitoring now.   3.  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  MRI at the onset was unremarkable.   Subjective:   Dominique Griffin was seen today in follow up for Parkinsons disease.  My previous records were reviewed prior to todays visit as well as outside records available to me. She has backed down on the IR dose of medication and spread out the CR some.  She states that it takes 1.5 hours for medication to peak but "I am fond of these drugs.  They help a lot."  Pt denies falls.  Pt with some lightheadedness but no near syncope.  She notes this when walking her 40 lb dog.  She is doing RSB, PWR Moves, tai chi, biking, stretching, walking dog for exercise.  No hallucinations.  Last visit, she was describing some nausea with her afternoon dose of levodopa.  I told her to try to take that one with carbohydrate, and she emailed me about a week later and stated that she seems to be doing  better with that.  Mood has been good.  She has been back to [facility] to see Dr. Fidela Juneau since our last visit.  Records are reviewed.  No changes were made to her medication dosing.  Current prescribed movement disorder medications: Carbidopa/levodopa 25/100, 1 at 7 AM/1 at 10 AM/1.5 tablets at 2 PM/1 tablet at 6 PM (1 at 8am/0.5 at 11:30/0.5 at 3:30) Carbidopa/levodopa 25/100 CR, 2 tablets at 7 AM, 1 tablet at 10 AM (she is currently doing this 1 po tid instead)    ALLERGIES:  No Known Allergies  CURRENT MEDICATIONS:  Current Meds  Medication Sig   carbidopa-levodopa (SINEMET IR) 25-100 MG tablet Take 1 tablet by mouth 3 (three) times daily. 7Am 1 tablet  at 11 half tablet and at 4pm half tablet   Carbidopa-Levodopa ER (SINEMET CR) 25-100 MG tablet controlled release Take 1 tablet by mouth 3 (three) times daily.   Cholecalciferol 50 MCG (2000 UT) TABS Take by mouth daily.   cyanocobalamin (VITAMIN B12) 1000 MCG tablet Take 5,000 mcg by mouth daily.   naproxen (NAPROSYN) 250 MG tablet 2 (two) times daily as needed.      Objective:   PHYSICAL EXAMINATION:    VITALS:   Vitals:   09/12/23 1412  BP: 118/78  Pulse: 83  SpO2: 97%  Weight: 192 lb 6.4 oz (87.3 kg)  Height: 5\' 6"  (1.676 m)    GEN:  The patient appears stated age and is in NAD. HEENT:  Normocephalic, atraumatic.  The mucous membranes are moist. The superficial temporal arteries are without ropiness or tenderness. CV:  RRR Lungs:  CTAB   Neurological examination:  Orientation: The patient is alert and oriented x3. Cranial nerves: There is good facial symmetry with no significant facial hypomimia. The speech is fluent and clear. Soft palate rises symmetrically.  Hearing is intact to conversational tone. Sensation: Sensation is intact to light touch throughout Motor: Strength is 5/5 in the bilateral upper and lower extremities.  Movement examination: Tone: There is min increased tone in the RUE/RLE Abnormal movements: there is rare tremor in the RUE.  No significant dyskinesia today. Coordination:  There is no decremation with RAM's, with any form of RAMS, including alternating supination and pronation of the forearm, hand opening and closing, finger taps, heel taps and toe taps.  Gait and Station: The patient has no difficulty arising out of a deep-seated chair without the use of the hands. The patient's stride length is good.  The patient has a negative pull test (stable)  I have reviewed and interpreted the following labs independently    Chemistry      Component Value Date/Time   NA 137 07/19/2023 1207   K 4.6 07/19/2023 1207   CL 103 07/19/2023 1207   CO2 25 07/19/2023 1207   BUN 11 07/19/2023 1207   CREATININE 0.82 07/19/2023 1207      Component Value Date/Time   CALCIUM 9.4 07/19/2023 1207   ALKPHOS 55 07/19/2023 1207   AST 14 (L) 07/19/2023 1207   ALT <5 07/19/2023 1207   BILITOT 1.5 (H) 07/19/2023 1207       Lab Results  Component Value Date   WBC 7.1 07/19/2023   HGB 14.2 07/19/2023   HCT 41.4  07/19/2023   MCV 90.4 07/19/2023   PLT 286 07/19/2023    Lab Results  Component Value Date   TSH 2.38 10/02/2021     Total time spent on today's visit was 30 minutes, including both face-to-face time and  nonface-to-face time.  Time included that spent on review of records (prior notes available to me/labs/imaging if pertinent), discussing treatment and goals, answering patient's questions and coordinating care.  Cc:  Pincus Sanes, MD

## 2023-09-12 ENCOUNTER — Encounter: Payer: Self-pay | Admitting: Neurology

## 2023-09-12 ENCOUNTER — Ambulatory Visit: Payer: Medicare PPO | Admitting: Neurology

## 2023-09-12 VITALS — BP 118/78 | HR 83 | Ht 66.0 in | Wt 192.4 lb

## 2023-09-12 DIAGNOSIS — G20B1 Parkinson's disease with dyskinesia, without mention of fluctuations: Secondary | ICD-10-CM | POA: Diagnosis not present

## 2023-09-17 ENCOUNTER — Encounter: Payer: Self-pay | Admitting: Internal Medicine

## 2023-09-19 ENCOUNTER — Ambulatory Visit: Payer: Medicare PPO | Admitting: Physical Therapy

## 2023-09-27 ENCOUNTER — Ambulatory Visit: Payer: Medicare PPO

## 2023-10-14 ENCOUNTER — Ambulatory Visit: Payer: Medicare PPO | Admitting: Hematology & Oncology

## 2023-10-14 ENCOUNTER — Inpatient Hospital Stay: Payer: Medicare PPO

## 2023-10-16 ENCOUNTER — Inpatient Hospital Stay: Attending: Hematology & Oncology

## 2023-10-16 ENCOUNTER — Inpatient Hospital Stay: Admitting: Hematology & Oncology

## 2023-10-16 ENCOUNTER — Encounter: Payer: Self-pay | Admitting: Hematology & Oncology

## 2023-10-16 VITALS — BP 124/67 | HR 85 | Temp 98.8°F | Resp 18 | Ht 66.0 in | Wt 196.0 lb

## 2023-10-16 DIAGNOSIS — D693 Immune thrombocytopenic purpura: Secondary | ICD-10-CM

## 2023-10-16 DIAGNOSIS — E538 Deficiency of other specified B group vitamins: Secondary | ICD-10-CM | POA: Diagnosis not present

## 2023-10-16 DIAGNOSIS — G20A1 Parkinson's disease without dyskinesia, without mention of fluctuations: Secondary | ICD-10-CM

## 2023-10-16 LAB — CBC WITH DIFFERENTIAL (CANCER CENTER ONLY)
Abs Immature Granulocytes: 0.02 10*3/uL (ref 0.00–0.07)
Basophils Absolute: 0.1 10*3/uL (ref 0.0–0.1)
Basophils Relative: 1 %
Eosinophils Absolute: 0.3 10*3/uL (ref 0.0–0.5)
Eosinophils Relative: 3 %
HCT: 42 % (ref 36.0–46.0)
Hemoglobin: 14.2 g/dL (ref 12.0–15.0)
Immature Granulocytes: 0 %
Lymphocytes Relative: 16 %
Lymphs Abs: 1.4 10*3/uL (ref 0.7–4.0)
MCH: 30.7 pg (ref 26.0–34.0)
MCHC: 33.8 g/dL (ref 30.0–36.0)
MCV: 90.7 fL (ref 80.0–100.0)
Monocytes Absolute: 1 10*3/uL (ref 0.1–1.0)
Monocytes Relative: 12 %
Neutro Abs: 5.7 10*3/uL (ref 1.7–7.7)
Neutrophils Relative %: 68 %
Platelet Count: 274 10*3/uL (ref 150–400)
RBC: 4.63 MIL/uL (ref 3.87–5.11)
RDW: 12.4 % (ref 11.5–15.5)
WBC Count: 8.4 10*3/uL (ref 4.0–10.5)
nRBC: 0 % (ref 0.0–0.2)

## 2023-10-16 LAB — SAVE SMEAR(SSMR), FOR PROVIDER SLIDE REVIEW

## 2023-10-16 LAB — CMP (CANCER CENTER ONLY)
ALT: <5 U/L (ref 0–44)
AST: 14 U/L — ABNORMAL LOW (ref 15–41)
Albumin: 4.6 g/dL (ref 3.5–5.0)
Alkaline Phosphatase: 67 U/L (ref 38–126)
Anion gap: 7 (ref 5–15)
BUN: 13 mg/dL (ref 8–23)
CO2: 27 mmol/L (ref 22–32)
Calcium: 9.5 mg/dL (ref 8.9–10.3)
Chloride: 104 mmol/L (ref 98–111)
Creatinine: 0.8 mg/dL (ref 0.44–1.00)
GFR, Estimated: >60 mL/min (ref >60)
Glucose, Bld: 93 mg/dL (ref 70–99)
Potassium: 4.2 mmol/L (ref 3.5–5.1)
Sodium: 138 mmol/L (ref 135–145)
Total Bilirubin: 1.4 mg/dL — ABNORMAL HIGH (ref 0.0–1.2)
Total Protein: 6.3 g/dL — ABNORMAL LOW (ref 6.5–8.1)

## 2023-10-16 LAB — LACTATE DEHYDROGENASE: LDH: 133 U/L (ref 98–192)

## 2023-10-16 NOTE — Progress Notes (Signed)
 Hematology and Oncology Follow Up Visit  Dominique Griffin [id number] Jul 20, 1950 73 y.o. 10/16/2023   Principle Diagnosis:  ITP-relapsed   Current Therapy:        Prednisone 80 mg po q day --taper down to 5 mg p.o. daily on 01/07/2023 Decadron 40 mg p.o. daily x 4 days-start 02/20/2023 IVIG 1 g/kg x 1 day-given on 02/22/2023 Rituxan 375 mg/m weekly x 4 weeks-start on 02/27/2023   Interim History:  Dominique Griffin is here today for follow-up.  We last saw her back in January.  Since then, she really has been done well.  She has had no problems with bleeding.  There is been no problems with her Parkinson's.  She has been active.  She been walking.  Her appetite is good.  She has had no nausea or vomiting.  Has been no change in bowel or bladder habits.  I do not think there has been any problems with COVID or Influenza.  She has had no rashes.  There is been no leg swelling.  She has had no bleeding.  Overall, I would say that her performance status is ECOG 1.    Medications:  Allergies as of 10/16/2023   No Known Allergies      Medication List        Accurate as of October 16, 2023  3:07 PM. If you have any questions, ask your nurse or doctor.          Carbidopa-Levodopa ER 25-100 MG tablet controlled release Commonly known as: SINEMET CR Take 1 tablet by mouth 3 (three) times daily.   carbidopa-levodopa 25-100 MG tablet Commonly known as: SINEMET IR Take 1 tablet by mouth 3 (three) times daily. 7Am 1 tablet  at 11 half tablet and at 4pm half tablet   Cholecalciferol 50 MCG (2000 UT) Tabs Take by mouth daily.   cyanocobalamin 1000 MCG tablet Commonly known as: VITAMIN B12 Take 5,000 mcg by mouth daily.   naproxen 250 MG tablet Commonly known as: NAPROSYN 2 (two) times daily as needed.        Allergies: No Known Allergies  Past Medical History, Surgical history, Social history, and Family History were reviewed and updated.  Review of Systems: Review of Systems   Constitutional: Negative.   HENT: Negative.    Eyes: Negative.   Respiratory: Negative.    Cardiovascular: Negative.   Gastrointestinal: Negative.   Genitourinary: Negative.   Musculoskeletal: Negative.   Skin: Negative.   Neurological:  Positive for tremors.  Endo/Heme/Allergies: Negative.   Psychiatric/Behavioral: Negative.        Physical Exam:  Vital signs are temperature of 98.8.  Pulse 85.  Blood pressure 124/67.  Weight is 196 pounds.  Wt Readings from Last 3 Encounters:  09/12/23 192 lb 6.4 oz (87.3 kg)  07/19/23 188 lb (85.3 kg)  05/31/23 188 lb 1.9 oz (85.3 kg)   Physical Exam Vitals reviewed.  HENT:     Head: Normocephalic and atraumatic.  Eyes:     Pupils: Pupils are equal, round, and reactive to light.  Cardiovascular:     Rate and Rhythm: Normal rate and regular rhythm.     Heart sounds: Normal heart sounds.  Pulmonary:     Effort: Pulmonary effort is normal.     Breath sounds: Normal breath sounds.  Abdominal:     General: Bowel sounds are normal.     Palpations: Abdomen is soft.  Musculoskeletal:        General: No tenderness or deformity.  Normal range of motion.     Cervical back: Normal range of motion.  Lymphadenopathy:     Cervical: No cervical adenopathy.  Skin:    General: Skin is warm and dry.     Findings: No erythema or rash.  Neurological:     Mental Status: She is alert and oriented to person, place, and time.  Psychiatric:        Behavior: Behavior normal.        Thought Content: Thought content normal.        Judgment: Judgment normal.      Lab Results  Component Value Date   WBC 8.4 10/16/2023   HGB 14.2 10/16/2023   HCT 42.0 10/16/2023   MCV 90.7 10/16/2023   PLT 274 10/16/2023   Lab Results  Component Value Date   FERRITIN 41 09/07/2022   IRON 120 09/07/2022   TIBC 396 09/07/2022   UIBC 276 09/07/2022   IRONPCTSAT 30 09/07/2022   Lab Results  Component Value Date   RETICCTPCT 1.5 09/07/2022   RBC 4.63  10/16/2023   No results found for: "KPAFRELGTCHN", "LAMBDASER", "KAPLAMBRATIO" No results found for: "IGGSERUM", "IGA", "IGMSERUM" Lab Results  Component Value Date   TOTALPROTELP 6.5 09/07/2022   ALBUMINELP 4.1 09/07/2022   A1GS 0.3 09/07/2022   A2GS 0.6 09/07/2022   BETS 1.0 09/07/2022   GAMS 0.5 09/07/2022   MSPIKE Not Observed 09/07/2022   SPEI Comment 09/07/2022     Chemistry      Component Value Date/Time   NA 137 07/19/2023 1207   K 4.6 07/19/2023 1207   CL 103 07/19/2023 1207   CO2 25 07/19/2023 1207   BUN 11 07/19/2023 1207   CREATININE 0.82 07/19/2023 1207      Component Value Date/Time   CALCIUM 9.4 07/19/2023 1207   ALKPHOS 55 07/19/2023 1207   AST 14 (L) 07/19/2023 1207   ALT <5 07/19/2023 1207   BILITOT 1.5 (H) 07/19/2023 1207       Impression and Plan:    Dominique Griffin is a pleasant 73 yo caucasian female with relapse of the ITP.  She was initially diagnosed earlier this year.  We had her on a prednisone taper.  She she subsequently had relapsed.  We treated her with IVIG and Rituxan.  She has had a very nice response.  Under the microscope, her blood looks fantastic.  The platelets are well granulated.  She has a few large platelets.  I do not see any immature white blood cells.  There are no nucleated red blood cells.  We will now bring her back in 3 months.  We will get her through [facility].  We might be able to get her back right around [place] Day.  Josph Macho, MD 4/9/20253:07 PM

## 2023-12-04 ENCOUNTER — Encounter: Payer: Medicare PPO | Admitting: Internal Medicine

## 2023-12-15 ENCOUNTER — Encounter: Payer: Self-pay | Admitting: Internal Medicine

## 2023-12-15 DIAGNOSIS — E66811 Obesity, class 1: Secondary | ICD-10-CM | POA: Insufficient documentation

## 2023-12-15 DIAGNOSIS — E6609 Other obesity due to excess calories: Secondary | ICD-10-CM | POA: Insufficient documentation

## 2023-12-15 NOTE — Patient Instructions (Addendum)

## 2023-12-15 NOTE — Assessment & Plan Note (Signed)
 Chronic Following with Dr. Maria Shiner S/p prednisone , IVIG, Rituxan  Currently in remission and being monitored

## 2023-12-15 NOTE — Assessment & Plan Note (Signed)
 Chronic Following with neurology-Dr. Merlyn Starring at [facility] and Dr. Winferd Hatter On Sinemet CR 25-100 mg 1 tab 3 times daily. On carbidopa/levodopa 25/100, 1 at 8 AM, 0.5 tabs at 11:30 AM, 0.5 tablets at 3:30 PM.

## 2023-12-15 NOTE — Progress Notes (Unsigned)
 Subjective:    Patient ID: Dominique Griffin, female    DOB: [birth year], 73 y.o.   MRN: [id number]      HPI Dominique Griffin is here for a Physical exam and her chronic medical problems.    Overall doing well.  No concerns.  Her husband was diagnosed with prostate cancer and is undergoing treatment.  Medications and allergies reviewed with patient and updated if appropriate.  Current Outpatient Medications on File Prior to Visit  Medication Sig Dispense Refill   carbidopa-levodopa (SINEMET IR) 25-100 MG tablet Take 1 tablet by mouth 3 (three) times daily. 7Am 1 tablet  at 11 half tablet and at 4pm half tablet     Cholecalciferol 50 MCG (2000 UT) TABS Take by mouth daily.     cyanocobalamin  (VITAMIN B12) 1000 MCG tablet Take 5,000 mcg by mouth daily.     naproxen (NAPROSYN) 250 MG tablet 2 (two) times daily as needed.     Carbidopa-Levodopa ER (SINEMET CR) 25-100 MG tablet controlled release Take 1 tablet by mouth 3 (three) times daily.     No current facility-administered medications on file prior to visit.    Review of Systems  Constitutional:  Negative for fever.  Eyes:  Negative for visual disturbance.  Respiratory:  Positive for cough (a little in the morning). Negative for shortness of breath and wheezing.   Cardiovascular:  Negative for chest pain, palpitations and leg swelling.  Gastrointestinal:  Positive for abdominal pain (rarely GB attack). Negative for blood in stool, constipation and diarrhea.       No gerd  Genitourinary:  Negative for dysuria.  Musculoskeletal:  Positive for arthralgias (some minor aches and pains) and back pain (lower back).  Skin:  Negative for rash.       Itchy scalp  Neurological:  Positive for dizziness. Negative for light-headedness and headaches.  Hematological:  Positive for adenopathy.  Psychiatric/Behavioral:  Negative for dysphoric mood. The patient is not nervous/anxious.        Objective:   Vitals:   12/16/23 1349  BP: 128/76   Pulse: 77  Temp: 98.5 F (36.9 C)  SpO2: 97%   Filed Weights   12/16/23 1349  Weight: 203 lb (92.1 kg)   Body mass index is 32.77 kg/m.  BP Readings from Last 3 Encounters:  12/16/23 128/76  10/16/23 124/67  09/12/23 118/78    Wt Readings from Last 3 Encounters:  12/16/23 203 lb (92.1 kg)  10/16/23 196 lb (88.9 kg)  09/12/23 192 lb 6.4 oz (87.3 kg)       Physical Exam Constitutional: She appears well-developed and well-nourished. No distress.  HENT:  Head: Normocephalic and atraumatic.  Right Ear: External ear normal. Normal ear canal and TM Left Ear: External ear normal.  Normal ear canal and TM Mouth/Throat: Oropharynx is clear and moist.  Eyes: Conjunctivae normal.  Neck: Neck supple. No tracheal deviation present. No thyromegaly present.  No carotid bruit  Cardiovascular: Normal rate, regular rhythm and normal heart sounds.   No murmur heard.  No edema. Pulmonary/Chest: Effort normal and breath sounds normal. No respiratory distress. She has no wheezes. She has no rales.  Breast: deferred   Abdominal: Soft. She exhibits no distension. There is no tenderness.  Lymphadenopathy: She has no cervical adenopathy.  Skin: Skin is warm and dry. She is not diaphoretic.  Psychiatric: She has a normal mood and affect. Her behavior is normal.     Lab Results  Component Value Date   WBC  8.4 10/16/2023   HGB 14.2 10/16/2023   HCT 42.0 10/16/2023   PLT 274 10/16/2023   GLUCOSE 93 10/16/2023   CHOL 180 10/02/2021   TRIG 62.0 10/02/2021   HDL 62.50 10/02/2021   LDLCALC 105 (H) 10/02/2021   ALT <5 10/16/2023   AST 14 (L) 10/16/2023   NA 138 10/16/2023   K 4.2 10/16/2023   CL 104 10/16/2023   CREATININE 0.80 10/16/2023   BUN 13 10/16/2023   CO2 27 10/16/2023   TSH 2.38 10/02/2021   INR 1.0 08/29/2022         Assessment & Plan:   Physical exam: Screening blood work  ordered Exercise  6 days a week - stretching, boxing, weights Weight  obese Substance  abuse  none   Reviewed recommended immunizations.   Health Maintenance  Topic Date Due   DTaP/Tdap/Td (2 - Td or Tdap) 01/23/2018   COVID-19 Vaccine (8 - 2024-25 season) 03/10/2023   Medicare Annual Wellness (AWV)  04/19/2023   DEXA SCAN  11/11/2023   INFLUENZA VACCINE  02/07/2024   Colonoscopy  12/14/2024   MAMMOGRAM  05/16/2025   Hepatitis C Screening  Completed   Zoster Vaccines- Shingrix  Completed   HPV VACCINES  Aged Out   Meningococcal B Vaccine  Aged Out   Pneumonia Vaccine 62+ Years old  Discontinued          See Problem List for Assessment and Plan of chronic medical problems.

## 2023-12-16 ENCOUNTER — Ambulatory Visit (INDEPENDENT_AMBULATORY_CARE_PROVIDER_SITE_OTHER): Admitting: Internal Medicine

## 2023-12-16 VITALS — BP 128/76 | HR 77 | Temp 98.5°F | Ht 66.0 in | Wt 203.0 lb

## 2023-12-16 DIAGNOSIS — R739 Hyperglycemia, unspecified: Secondary | ICD-10-CM | POA: Diagnosis not present

## 2023-12-16 DIAGNOSIS — Z0000 Encounter for general adult medical examination without abnormal findings: Secondary | ICD-10-CM | POA: Diagnosis not present

## 2023-12-16 DIAGNOSIS — E6609 Other obesity due to excess calories: Secondary | ICD-10-CM

## 2023-12-16 DIAGNOSIS — E2839 Other primary ovarian failure: Secondary | ICD-10-CM | POA: Diagnosis not present

## 2023-12-16 DIAGNOSIS — E559 Vitamin D deficiency, unspecified: Secondary | ICD-10-CM

## 2023-12-16 DIAGNOSIS — M85852 Other specified disorders of bone density and structure, left thigh: Secondary | ICD-10-CM | POA: Diagnosis not present

## 2023-12-16 DIAGNOSIS — Z6830 Body mass index (BMI) 30.0-30.9, adult: Secondary | ICD-10-CM | POA: Diagnosis not present

## 2023-12-16 DIAGNOSIS — G20A1 Parkinson's disease without dyskinesia, without mention of fluctuations: Secondary | ICD-10-CM | POA: Diagnosis not present

## 2023-12-16 DIAGNOSIS — E538 Deficiency of other specified B group vitamins: Secondary | ICD-10-CM

## 2023-12-16 DIAGNOSIS — R42 Dizziness and giddiness: Secondary | ICD-10-CM | POA: Diagnosis not present

## 2023-12-16 DIAGNOSIS — E66811 Obesity, class 1: Secondary | ICD-10-CM | POA: Diagnosis not present

## 2023-12-16 DIAGNOSIS — D693 Immune thrombocytopenic purpura: Secondary | ICD-10-CM | POA: Diagnosis not present

## 2023-12-16 LAB — LIPID PANEL
Cholesterol: 175 mg/dL (ref 0–200)
HDL: 61.3 mg/dL (ref >39.00)
LDL Cholesterol: 99 mg/dL (ref 0–99)
NonHDL: 113.77
Total CHOL/HDL Ratio: 3
Triglycerides: 72 mg/dL (ref 0.0–149.0)
VLDL: 14.4 mg/dL (ref 0.0–40.0)

## 2023-12-16 LAB — COMPREHENSIVE METABOLIC PANEL WITH GFR
ALT: 3 U/L (ref 0–35)
AST: 18 U/L (ref 0–37)
Albumin: 4.4 g/dL (ref 3.5–5.2)
Alkaline Phosphatase: 58 U/L (ref 39–117)
BUN: 11 mg/dL (ref 6–23)
CO2: 28 meq/L (ref 19–32)
Calcium: 9.3 mg/dL (ref 8.4–10.5)
Chloride: 103 meq/L (ref 96–112)
Creatinine, Ser: 0.76 mg/dL (ref 0.40–1.20)
GFR: 78.49 mL/min (ref >60.00)
Glucose, Bld: 83 mg/dL (ref 70–99)
Potassium: 4 meq/L (ref 3.5–5.1)
Sodium: 138 meq/L (ref 135–145)
Total Bilirubin: 1.4 mg/dL — ABNORMAL HIGH (ref 0.2–1.2)
Total Protein: 6.5 g/dL (ref 6.0–8.3)

## 2023-12-16 LAB — HEMOGLOBIN A1C: Hgb A1c MFr Bld: 5.3 % (ref 4.6–6.5)

## 2023-12-16 NOTE — Assessment & Plan Note (Addendum)
 Subacute Lightheadedness/imbalance Follows with neuro Has seen ENT Meclizine not effective Dx PPPD - Persistent postural perceptual dizziness/instability- ? related to parkinson's or postural Doing vestibular exercises

## 2023-12-16 NOTE — Assessment & Plan Note (Signed)
 Chronic Exercising regularly, eating healthy

## 2023-12-16 NOTE — Assessment & Plan Note (Addendum)
 Chronic Taking B12 supplementation 5000 mcg daily Check B12 level

## 2023-12-16 NOTE — Assessment & Plan Note (Addendum)
 Chronic Dexa due-ordered Exercises regularly Continue vitamin d Not taking calcium - increase calcium in diet Check vitamin D level

## 2023-12-16 NOTE — Assessment & Plan Note (Signed)
 Chronic Continue taking vitamin d Check vitamin D level

## 2023-12-17 ENCOUNTER — Encounter: Payer: Self-pay | Admitting: Internal Medicine

## 2023-12-19 ENCOUNTER — Encounter: Payer: Self-pay | Admitting: Hematology & Oncology

## 2023-12-19 ENCOUNTER — Ambulatory Visit: Payer: Self-pay | Admitting: Internal Medicine

## 2023-12-19 ENCOUNTER — Ambulatory Visit: Admitting: Physical Therapy

## 2023-12-19 DIAGNOSIS — M810 Age-related osteoporosis without current pathological fracture: Secondary | ICD-10-CM

## 2023-12-19 LAB — VITAMIN D 25 HYDROXY (VIT D DEFICIENCY, FRACTURES): VITD: 45.41 ng/mL (ref 30.00–100.00)

## 2023-12-19 LAB — TSH: TSH: 1.95 u[IU]/mL (ref 0.35–5.50)

## 2023-12-19 LAB — VITAMIN B12: Vitamin B-12: >1500 pg/mL — ABNORMAL HIGH (ref 211–911)

## 2023-12-26 IMAGING — US US BREAST*R* LIMITED INC AXILLA
1 series · 12 of 12 positions shown · non-contrast
Comparison: Previous exam(s).

CLINICAL DATA: Follow-up for probably benign cysts in the RIGHT
breast. These probably benign findings were initially identified on
screening mammogram dated 05/08/2021.

EXAM:
DIGITAL DIAGNOSTIC UNILATERAL RIGHT MAMMOGRAM WITH TOMOSYNTHESIS AND
CAD; ULTRASOUND RIGHT BREAST LIMITED
TECHNIQUE: Right digital diagnostic mammography and breast tomosynthesis was
performed. The images were evaluated with computer-aided detection.;
Targeted ultrasound examination of the right breast was performed

[Series 1: us breast*right* limited inc axilla · 0.06mm/px · 12 of 12 slices shown]
[im 1/12]
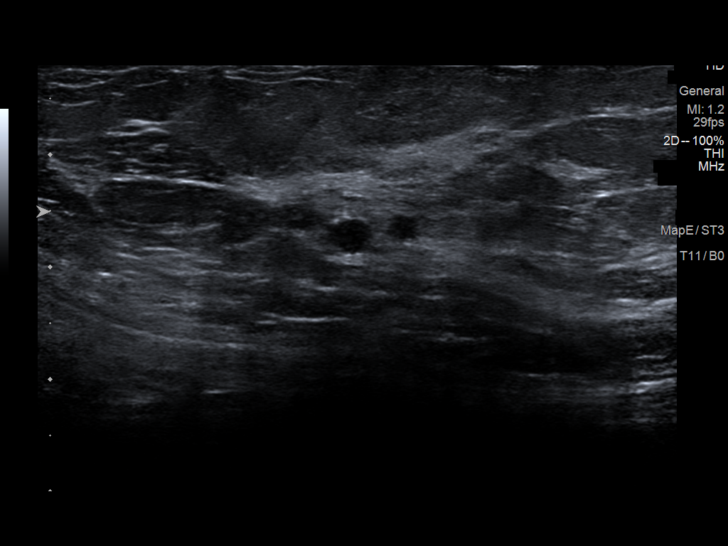
[im 2/12]
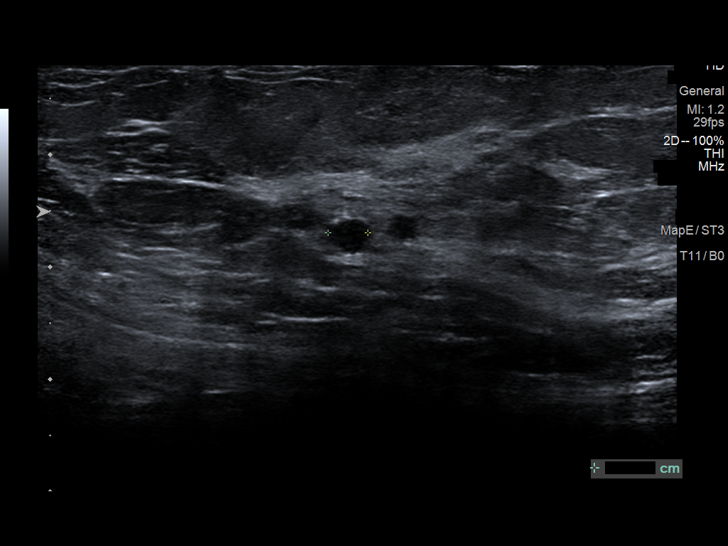
[im 3/12]
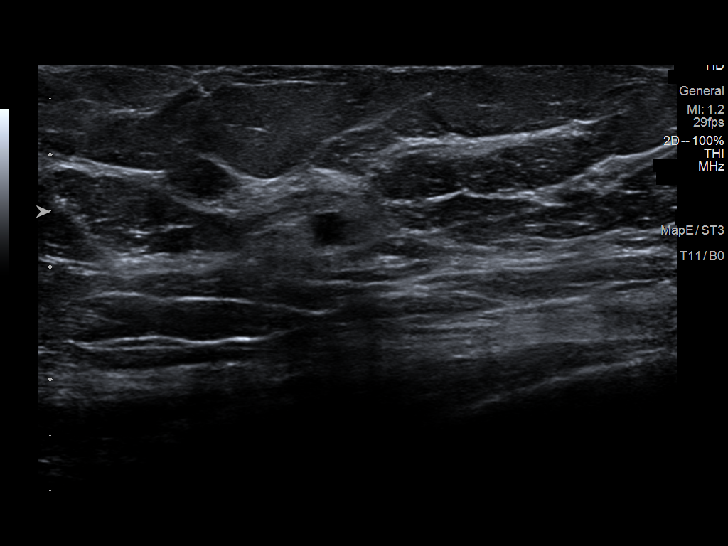
[im 4/12]
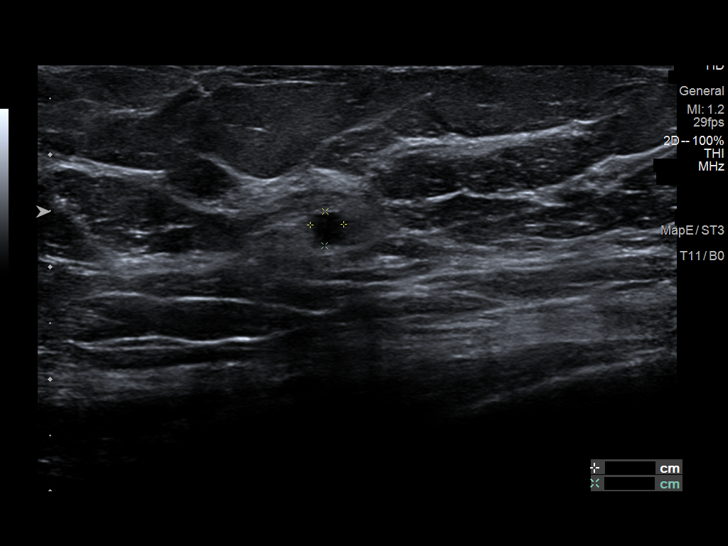
[im 5/12]
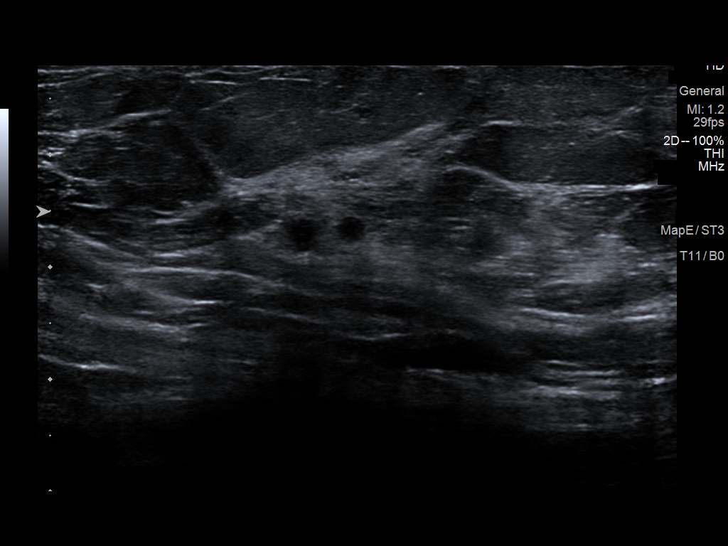
[im 6/12]
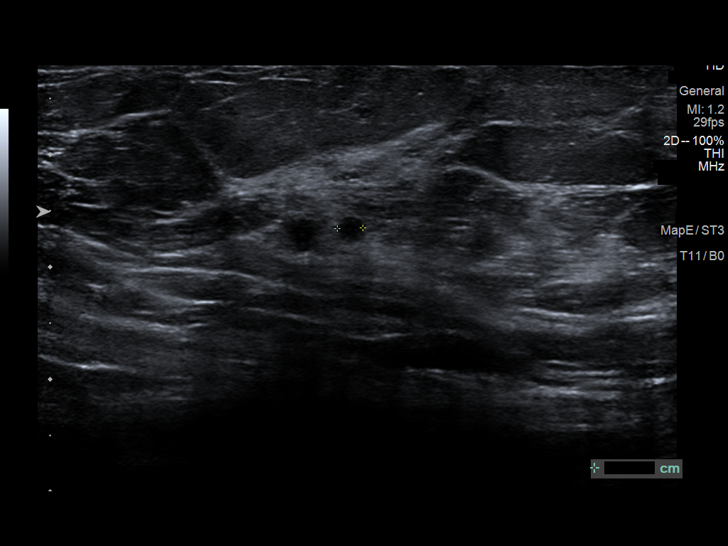
[im 7/12]
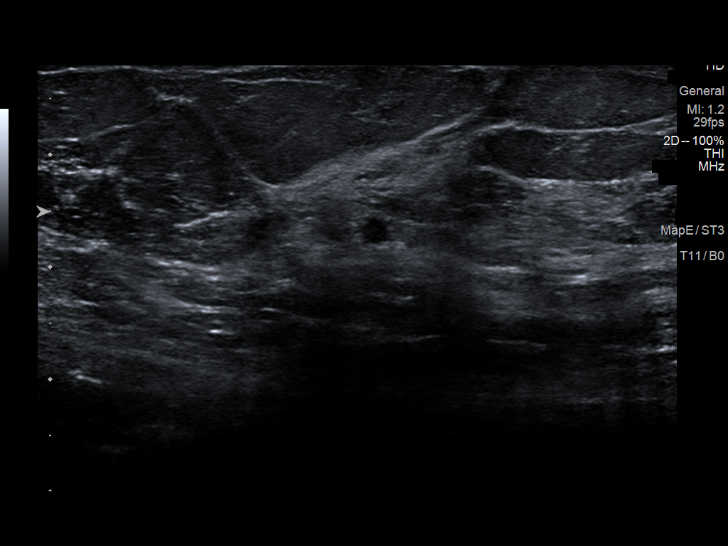
[im 8/12]
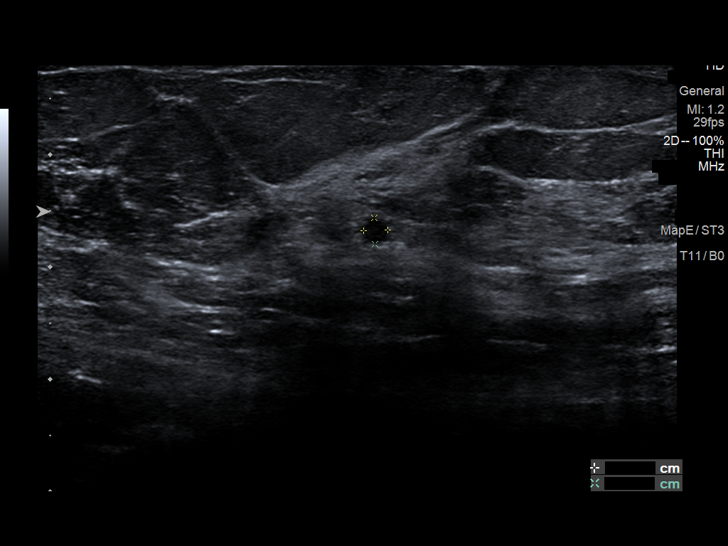
[im 9/12]
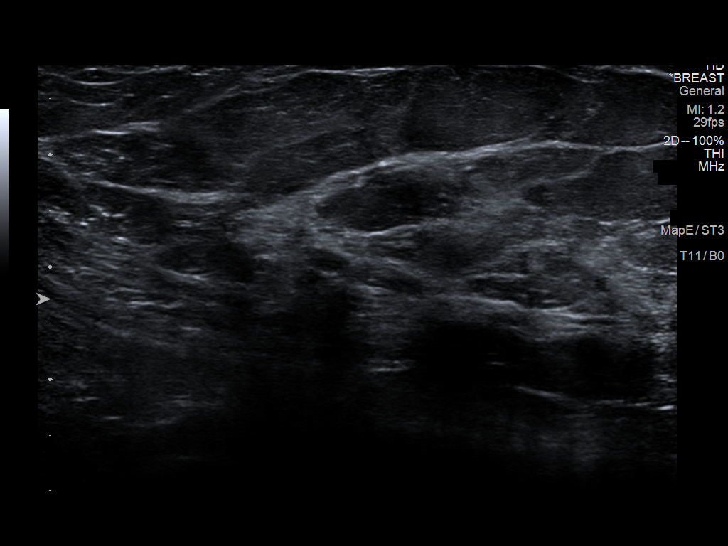
[im 10/12]
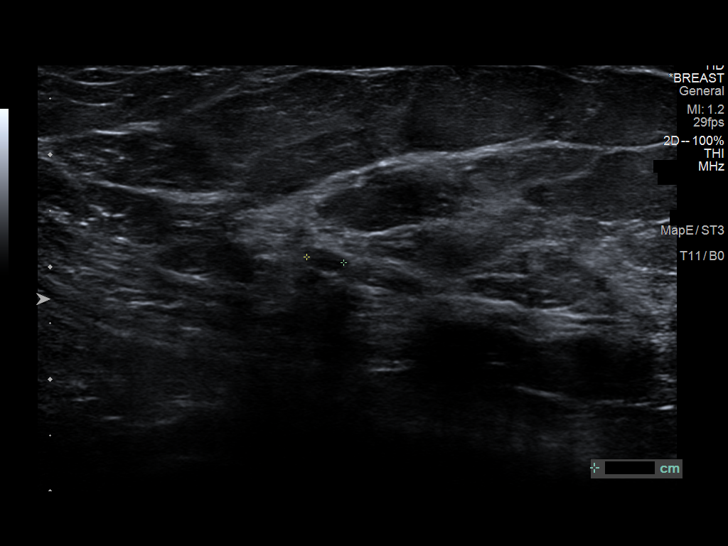
[im 11/12]
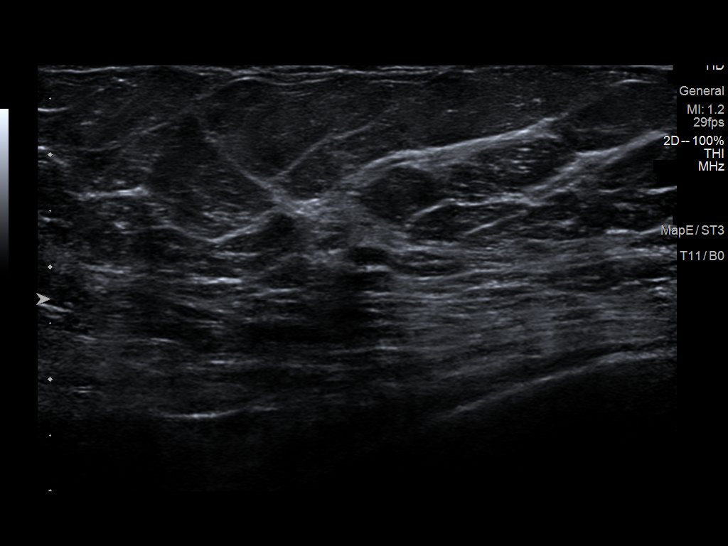
[im 12/12]
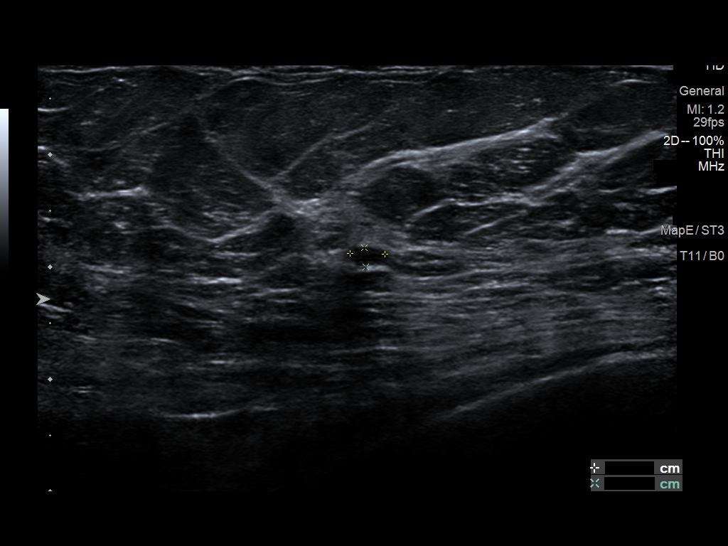

[12 of 12 positions shown; findings below may reference images not displayed]

ACR Breast Density Category b: There are scattered areas of
fibroglandular density.
FINDINGS: There are no new dominant masses, suspicious calcifications or
secondary signs of malignancy within the RIGHT breast.

Targeted ultrasound is performed, again showing a cluster of nearly
anechoic masses in the RIGHT breast at the 12 o'clock axis, largest
of which has decreased in size confirming benignity.
IMPRESSION: No evidence of malignancy within the RIGHT breast. Benign cluster of
cysts in the RIGHT breast at the 12 o'clock axis, largest component
having decreased in size confirming benignity.

Patient may return to routine annual bilateral screening mammogram
schedule. Next bilateral screening mammogram will be due in Monday May, 2022 corresponding to patient's routine annual screening
mammogram schedule for the LEFT breast.

RECOMMENDATION:
Bilateral screening mammogram in Monday May, 2022.

I have discussed the findings and recommendations with the patient.
If applicable, a reminder letter will be sent to the patient
regarding the next appointment.

BI-RADS CATEGORY  2: Benign.

## 2023-12-30 ENCOUNTER — Encounter: Payer: Self-pay | Admitting: Internal Medicine

## 2024-01-20 ENCOUNTER — Ambulatory Visit: Admitting: Medical Oncology

## 2024-01-20 ENCOUNTER — Inpatient Hospital Stay

## 2024-01-21 ENCOUNTER — Inpatient Hospital Stay: Admitting: Hematology & Oncology

## 2024-01-21 ENCOUNTER — Encounter: Payer: Self-pay | Admitting: Hematology & Oncology

## 2024-01-21 ENCOUNTER — Inpatient Hospital Stay: Attending: Hematology & Oncology

## 2024-01-21 VITALS — BP 140/89 | HR 84 | Temp 99.0°F | Resp 20 | Ht 66.0 in | Wt 202.4 lb

## 2024-01-21 DIAGNOSIS — G20A1 Parkinson's disease without dyskinesia, without mention of fluctuations: Secondary | ICD-10-CM

## 2024-01-21 DIAGNOSIS — D693 Immune thrombocytopenic purpura: Secondary | ICD-10-CM | POA: Insufficient documentation

## 2024-01-21 DIAGNOSIS — E538 Deficiency of other specified B group vitamins: Secondary | ICD-10-CM | POA: Diagnosis not present

## 2024-01-21 LAB — CMP (CANCER CENTER ONLY)
ALT: <5 U/L (ref 0–44)
AST: 12 U/L — ABNORMAL LOW (ref 15–41)
Albumin: 4.5 g/dL (ref 3.5–5.0)
Alkaline Phosphatase: 70 U/L (ref 38–126)
Anion gap: 8 (ref 5–15)
BUN: 12 mg/dL (ref 8–23)
CO2: 26 mmol/L (ref 22–32)
Calcium: 9.8 mg/dL (ref 8.9–10.3)
Chloride: 107 mmol/L (ref 98–111)
Creatinine: 0.87 mg/dL (ref 0.44–1.00)
GFR, Estimated: >60 mL/min (ref >60)
Glucose, Bld: 116 mg/dL — ABNORMAL HIGH (ref 70–99)
Potassium: 3.9 mmol/L (ref 3.5–5.1)
Sodium: 141 mmol/L (ref 135–145)
Total Bilirubin: 1.1 mg/dL (ref 0.0–1.2)
Total Protein: 6.2 g/dL — ABNORMAL LOW (ref 6.5–8.1)

## 2024-01-21 LAB — CBC WITH DIFFERENTIAL (CANCER CENTER ONLY)
Abs Immature Granulocytes: 0.03 K/uL (ref 0.00–0.07)
Basophils Absolute: 0.1 K/uL (ref 0.0–0.1)
Basophils Relative: 1 %
Eosinophils Absolute: 0.5 K/uL (ref 0.0–0.5)
Eosinophils Relative: 6 %
HCT: 42.3 % (ref 36.0–46.0)
Hemoglobin: 14.2 g/dL (ref 12.0–15.0)
Immature Granulocytes: 0 %
Lymphocytes Relative: 15 %
Lymphs Abs: 1.3 K/uL (ref 0.7–4.0)
MCH: 30.1 pg (ref 26.0–34.0)
MCHC: 33.6 g/dL (ref 30.0–36.0)
MCV: 89.6 fL (ref 80.0–100.0)
Monocytes Absolute: 1 K/uL (ref 0.1–1.0)
Monocytes Relative: 12 %
Neutro Abs: 5.6 K/uL (ref 1.7–7.7)
Neutrophils Relative %: 66 %
Platelet Count: 275 K/uL (ref 150–400)
RBC: 4.72 MIL/uL (ref 3.87–5.11)
RDW: 12.1 % (ref 11.5–15.5)
WBC Count: 8.4 K/uL (ref 4.0–10.5)
nRBC: 0 % (ref 0.0–0.2)

## 2024-01-21 LAB — VITAMIN B12: Vitamin B-12: 1194 pg/mL — ABNORMAL HIGH (ref 180–914)

## 2024-01-21 LAB — SAVE SMEAR(SSMR), FOR PROVIDER SLIDE REVIEW

## 2024-01-21 NOTE — Progress Notes (Signed)
 Hematology and Oncology Follow Up Visit  Dominique Griffin [id number] 01-24-1951 73 y.o. 01/21/2024   Principle Diagnosis:  ITP-relapsed   Current Therapy:        Prednisone  80 mg po q day --taper down to 5 mg p.o. daily on 01/07/2023 Decadron  40 mg p.o. daily x 4 days-start 02/20/2023 IVIG 1 g/kg x 1 day-given on 02/22/2023 Rituxan  375 mg/m weekly x 4 weeks-start on 02/27/2023   Interim History:  Dominique Griffin is here today for follow-up.  We saw her 3 months ago.  Since then, he has been doing pretty well.  She has had no problems with bleeding.  The Parkinson's seems to be under fairly good control.  I think she sees her Neurologist in a month or so.  She has had no problems with fever.  Her appetite is good.  She has had no change in bowel or bladder habits.  She has had no cough or shortness of breath.  She has had no headache.  She has had no rashes.  There is been no ecchymoses.  She has had no tingling or numbness in the hands or feet.  Overall, I would have to say that her performance status is ECOG 1.    Medications:  Allergies as of 01/21/2024   No Known Allergies      Medication List        Accurate as of January 21, 2024  4:35 PM. If you have any questions, ask your nurse or doctor.          Carbidopa-Levodopa ER 25-100 MG tablet controlled release Commonly known as: SINEMET CR Take 1 tablet by mouth 3 (three) times daily.   carbidopa-levodopa 25-100 MG tablet Commonly known as: SINEMET IR Take 1 tablet by mouth 3 (three) times daily. 7Am 1 tablet  at 11 half tablet and at 4pm half tablet   Cholecalciferol 50 MCG (2000 UT) Tabs Take by mouth daily.   CYANOCOBALAMIN  PO Take 250 mcg by mouth 3 (three) times a week. What changed: Another medication with the same name was removed. Continue taking this medication, and follow the directions you see here. Changed by: [name]   naproxen 250 MG tablet Commonly known as: NAPROSYN 2 (two) times daily as  needed.        Allergies: No Known Allergies  Past Medical History, Surgical history, Social history, and Family History were reviewed and updated.  Review of Systems: Review of Systems  Constitutional: Negative.   HENT: Negative.    Eyes: Negative.   Respiratory: Negative.    Cardiovascular: Negative.   Gastrointestinal: Negative.   Genitourinary: Negative.   Musculoskeletal: Negative.   Skin: Negative.   Neurological:  Positive for tremors.  Endo/Heme/Allergies: Negative.   Psychiatric/Behavioral: Negative.        Physical Exam:  Vital signs are temperature of 99.  Pulse 84.  Blood pressure 140/89.  Weight is 202 pounds.    Wt Readings from Last 3 Encounters:  01/21/24 202 lb 6.4 oz (91.8 kg)  12/16/23 203 lb (92.1 kg)  10/16/23 196 lb (88.9 kg)   Physical Exam Vitals reviewed.  HENT:     Head: Normocephalic and atraumatic.  Eyes:     Pupils: Pupils are equal, round, and reactive to light.  Cardiovascular:     Rate and Rhythm: Normal rate and regular rhythm.     Heart sounds: Normal heart sounds.  Pulmonary:     Effort: Pulmonary effort is normal.     Breath  sounds: Normal breath sounds.  Abdominal:     General: Bowel sounds are normal.     Palpations: Abdomen is soft.  Musculoskeletal:        General: No tenderness or deformity. Normal range of motion.     Cervical back: Normal range of motion.  Lymphadenopathy:     Cervical: No cervical adenopathy.  Skin:    General: Skin is warm and dry.     Findings: No erythema or rash.  Neurological:     Mental Status: She is alert and oriented to person, place, and time.  Psychiatric:        Behavior: Behavior normal.        Thought Content: Thought content normal.        Judgment: Judgment normal.      Lab Results  Component Value Date   WBC 8.4 01/21/2024   HGB 14.2 01/21/2024   HCT 42.3 01/21/2024   MCV 89.6 01/21/2024   PLT 275 01/21/2024   Lab Results  Component Value Date   FERRITIN 41  09/07/2022   IRON 120 09/07/2022   TIBC 396 09/07/2022   UIBC 276 09/07/2022   IRONPCTSAT 30 09/07/2022   Lab Results  Component Value Date   RETICCTPCT 1.5 09/07/2022   RBC 4.72 01/21/2024   No results found for: KPAFRELGTCHN, LAMBDASER, KAPLAMBRATIO No results found for: KIMBERLY LE, IGMSERUM Lab Results  Component Value Date   TOTALPROTELP 6.5 09/07/2022   ALBUMINELP 4.1 09/07/2022   A1GS 0.3 09/07/2022   A2GS 0.6 09/07/2022   BETS 1.0 09/07/2022   GAMS 0.5 09/07/2022   MSPIKE Not Observed 09/07/2022   SPEI Comment 09/07/2022     Chemistry      Component Value Date/Time   NA 141 01/21/2024 1520   K 3.9 01/21/2024 1520   CL 107 01/21/2024 1520   CO2 26 01/21/2024 1520   BUN 12 01/21/2024 1520   CREATININE 0.87 01/21/2024 1520      Component Value Date/Time   CALCIUM 9.8 01/21/2024 1520   ALKPHOS 70 01/21/2024 1520   AST 12 (L) 01/21/2024 1520   ALT <5 01/21/2024 1520   BILITOT 1.1 01/21/2024 1520       Impression and Plan:    Dominique Griffin is a pleasant 73 yo caucasian female with relapse of the ITP.  She was initially diagnosed earlier this year.  We had her on a prednisone  taper.  She she subsequently had relapsed.  We treated her with IVIG and Rituxan .  She has had a very nice response.  Under the microscope, her blood looks fantastic.  The platelets are well granulated.  She has a few large platelets.  I do not see any immature white blood cells.  There are no nucleated red blood cells.  She is still in remission.  Hopefully she will stay in a long-term remission.  This will certainly help her out.  I think we can probably try to move her appointment out now to 4 months.  We will get her back around the Holiday season.    [name], MD 7/15/20254:35 PM

## 2024-01-22 ENCOUNTER — Encounter: Payer: Self-pay | Admitting: Internal Medicine

## 2024-01-22 ENCOUNTER — Other Ambulatory Visit: Payer: Self-pay

## 2024-01-22 ENCOUNTER — Encounter: Payer: Self-pay | Admitting: Hematology & Oncology

## 2024-01-22 NOTE — Progress Notes (Signed)
 Medication change per pt

## 2024-03-05 ENCOUNTER — Encounter: Payer: Self-pay | Admitting: Internal Medicine

## 2024-03-10 ENCOUNTER — Ambulatory Visit: Admitting: Internal Medicine

## 2024-03-10 ENCOUNTER — Encounter: Payer: Self-pay | Admitting: Internal Medicine

## 2024-03-10 VITALS — BP 142/90 | HR 79 | Temp 98.3°F | Ht 66.0 in | Wt 201.0 lb

## 2024-03-10 DIAGNOSIS — M5416 Radiculopathy, lumbar region: Secondary | ICD-10-CM

## 2024-03-10 MED ORDER — MELOXICAM 7.5 MG PO TABS: 7.5000 mg | ORAL_TABLET | Freq: Every day | ORAL | 2 refills | Status: AC

## 2024-03-10 MED ORDER — PREDNISONE 20 MG PO TABS: 40.0000 mg | ORAL_TABLET | Freq: Every day | ORAL | 0 refills | Status: DC

## 2024-03-10 NOTE — Patient Instructions (Addendum)
      Medications changes include :   you can take 3000 mg of tylenol  in one day.  Prednisone  40 mg daily x 5 days.  Stop naprosyn.  Start meloxicam  if needed after completing the prednisone .      Consider physical therapy.      Return if symptoms worsen or fail to improve.

## 2024-03-10 NOTE — Progress Notes (Signed)
 Subjective:    Patient ID: Dominique Griffin, female    DOB: 1951/06/10, 73 y.o.   MRN: [id number]      HPI Dominique Griffin is here for  Chief Complaint  Patient presents with   Back Pain    Left outer thigh pain and knee  She is here with her husband.    She started having low back pain 7 days ago.  She states pain mostly in the left lower back and pain radiating down to her left upper lateral side.  Pain at times goes down to the knee.  She denies any numbness, tingling or weakness in the leg.  She denies any specific cause of the pain.  She has been taking Tylenol  and naproxen 250 mg 3 times daily.  She has been doing cold and ice packs.  All positions are uncomfortable.  She has been doing a little walking, but is limited because of the pain.  She has been able to sleep.  She has not been able to exercise in the past week.  Her current pain level is 6-7/10.  There has been some improvement over the past few days.  History of sciatica in her 31s.       Medications and allergies reviewed with patient and updated if appropriate.  Current Outpatient Medications on File Prior to Visit  Medication Sig Dispense Refill   acetaminophen  (TYLENOL ) 325 MG tablet      carbidopa-levodopa (SINEMET IR) 25-100 MG tablet Take 1 tablet by mouth 3 (three) times daily. 7Am 1 tablet  at 11 half tablet and at 4pm half tablet     Carbidopa-Levodopa ER (SINEMET CR) 25-100 MG tablet controlled release Take 1 tablet by mouth 3 (three) times daily.     Cholecalciferol 50 MCG (2000 UT) TABS Take by mouth daily.     CYANOCOBALAMIN  PO Take 2,500 mcg by mouth 3 (three) times a week.     naproxen (NAPROSYN) 250 MG tablet 2 (two) times daily as needed.     No current facility-administered medications on file prior to visit.    Review of Systems     Objective:   Vitals:   03/10/24 1442  BP: (!) 142/90  Pulse: 79  Temp: 98.3 F (36.8 C)  SpO2: 96%   BP Readings from Last 3 Encounters:  03/10/24 (!)  142/90  01/21/24 (!) 140/89  12/16/23 128/76   Wt Readings from Last 3 Encounters:  03/10/24 201 lb (91.2 kg)  01/21/24 202 lb 6.4 oz (91.8 kg)  12/16/23 203 lb (92.1 kg)   Body mass index is 32.44 kg/m.    Physical Exam Constitutional:      General: She is not in acute distress.    Appearance: Normal appearance. She is not ill-appearing.  HENT:     Head: Normocephalic and atraumatic.  Musculoskeletal:        General: Tenderness (Left lower back) present. No swelling or deformity.     Right lower leg: No edema.     Left lower leg: No edema.     Comments: No lumbar spine tenderness, no right lower back tenderness.  Increased pain with lumbar extension  Neurological:     Mental Status: She is alert.     Sensory: No sensory deficit.     Motor: No weakness.     Comments: Negative straight leg raise bilaterally            Assessment & Plan:    See Problem List for Assessment  and Plan of chronic medical problems.

## 2024-03-10 NOTE — Assessment & Plan Note (Signed)
 Acute Started 7 days ago Left lower back pain with pain in left lateral upper leg, no n/t, weakness Taking naprosyn, tylenol  Discussed options Start prednisone  40 mg daily x 5 days - hold nsaids Continue tylenol  - up to 3000 mg / day Continue heat/ ice After prednisone  can start meloxicam  7.5-15 mg daily - use short term only - no naprosyn while taking this Discussed gabapentin, muscle relaxer PT Consider PT -she will let me know if she needs a referral.

## 2024-03-11 ENCOUNTER — Encounter: Payer: Self-pay | Admitting: Internal Medicine

## 2024-03-12 ENCOUNTER — Ambulatory Visit: Admitting: Neurology

## 2024-03-13 ENCOUNTER — Encounter: Payer: Self-pay | Admitting: Neurology

## 2024-03-16 NOTE — Progress Notes (Unsigned)
 Virtual Visit Via Video       Consent was obtained for video visit:  {yes no:314532} Answered questions that patient had about telehealth interaction:  {yes no:314532} I discussed the limitations, risks, security and privacy concerns of performing an evaluation and management service by telemedicine. I also discussed with the patient that there may be a patient responsible charge related to this service. The patient expressed understanding and agreed to proceed.  Pt location: Home Physician Location: office Name of referring provider:  Geofm Glade PARAS, MD I connected with Will CROME Mancia at patients initiation/request on 03/17/2024 at  9:15 AM EDT by video enabled telemedicine application and verified that I am speaking with the correct person using two identifiers. Pt MRN:  [id number] Pt DOB:  11-17-1950 Video Participants:  Will CROME Lingo;  ***  Assessment/Plan:   1.  Parkinsons disease, diagnosed late, 2020             -Started levodopa in December, 2023             -she participated in the TEMPO tavapadon drug trial at [facility].  Data has since been published showing positive results from this trial with significant increase in total on time without troublesome dyskinesia.  It is a D1/D5 partial receptor agonist.             -Patient is on a somewhat strange combination of immediate and extended release levodopa, but I cannot deny that she looked well today.  She has actually backed down on her dosing since last visit.  She does note that it takes 1-1/2 hours for medication to peak and may not last the entire time, but overall she is happy with the dosing.  I told her to make sure she is not underdosing herself.  Overall, she did actually look pretty good today even on the lower dose.  She is currently taking carbidopa/levodopa 25/100, 1 at 8 AM/0.5 tablets at 11:30 AM and 0.5 tablets at 3:30 PM.  -Patient also takes carbidopa/levodopa 25/100 CR, 1 tablet 3 times per day at the same dosage  times as the above.             -We discussed Rytary and Crexont.  I think Crexont may be a little expensive, but Rytary may go generic in July and could be an option then.  We can discuss further at next visit. -Patient continues to exercise regularly.  I am proud of her. -We discussed Vyalev, which is foscarbidopa/foslevodopa pump that is newly FDA approved.  We discussed that it is for motor fluctuations in adults with advanced Parkinson's disease.  We discussed that this likely will not be on Medicare formulary until the latter half of 2025.  We discussed risks and benefits of this drug.   2.  ITP             -Follows with Dr. Timmy.  Has had several courses of IVIG and rituximab  in past but monitoring now.  She is currently in remission.   3.  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  MRI at the onset was unremarkable.   Subjective:   Dominique Griffin was seen today in  follow up for Parkinsons disease.  My previous records were reviewed prior to todays visit as well as outside records available to me. She has been dealing with back pain, but that is starting to improve after a course of steroids.  She opted to make today's visit virtual due to the back pain.  Prior to that, she was active and doing well.  She was doing participating with boxing, weightlifting, stretching and walking, but exercise has been limited lately because of her back pain.  She is taking her levodopa faithfully.  She saw Dr. Timmy July 15.  I have reviewed his notes and she is doing well from the ITP standpoint.  Current prescribed movement disorder medications: Carbidopa/levodopa 25/100, 1 at 7 AM/1 at 10 AM/1.5 tablets at 2 PM/1 tablet at 6 PM (1 at 8am/0.5 at 11:30/0.5 at 3:30) Carbidopa/levodopa 25/100 CR, 2 tablets at 7 AM, 1 tablet at 10 AM (she is currently doing  this 1 po tid instead)    ALLERGIES:  No Known Allergies  CURRENT MEDICATIONS:  No outpatient medications have been marked as taking for the 03/17/24 encounter (Appointment) with Winslow Ederer, Asberry RAMAN, DO.     Objective:   PHYSICAL EXAMINATION:    VITALS:   There were no vitals filed for this visit.   GEN:  The patient appears stated age and is in NAD. HEENT:  Normocephalic, atraumatic.     Neurological examination:  Orientation: The patient is alert and oriented x3. Cranial nerves: There is good facial symmetry with no significant facial hypomimia. The speech is fluent and clear.   Hearing is intact to conversational tone. Sensation: n/a Motor: Strength is at least antigravity x 4  Movement examination: Tone: n/a Abnormal movements: none Coordination:  There is no decremation with RAM's, with any form of RAMS, including alternating supination and pronation of the forearm, hand opening and closing, finger taps, heel taps and toe taps.  Gait and Station: The patient ambulates well  I have reviewed and interpreted the following labs independently    Chemistry      Component Value Date/Time   NA 141 01/21/2024 1520   K 3.9 01/21/2024 1520   CL 107 01/21/2024 1520   CO2 26 01/21/2024 1520   BUN 12 01/21/2024 1520   CREATININE 0.87 01/21/2024 1520      Component Value Date/Time   CALCIUM 9.8 01/21/2024 1520   ALKPHOS 70 01/21/2024 1520   AST 12 (L) 01/21/2024 1520   ALT <5 01/21/2024 1520   BILITOT 1.1 01/21/2024 1520       Lab Results  Component Value Date   WBC 8.4 01/21/2024   HGB 14.2 01/21/2024   HCT 42.3 01/21/2024   MCV 89.6 01/21/2024   PLT 275 01/21/2024    Lab Results  Component Value Date   TSH 1.95 12/16/2023   Follow up Instructions      -I discussed the assessment and treatment plan with the patient. The patient was provided an opportunity to ask questions and all were answered. The patient agreed with the plan and demonstrated an  understanding of the instructions.   The patient was advised to call back or seek an in-person evaluation if the symptoms worsen or if the condition fails to improve as anticipated.    Total time spent on today's visit was ***minutes, including both face-to-face time and nonface-to-face time.  Time included that spent on review of records (prior notes available to me/labs/imaging if pertinent), discussing treatment and goals, answering patient's questions  and coordinating care.   Asberry Schneider, DO   Cc:  Geofm Glade PARAS, MD

## 2024-03-17 ENCOUNTER — Encounter: Payer: Self-pay | Admitting: Internal Medicine

## 2024-03-17 ENCOUNTER — Telehealth: Admitting: Neurology

## 2024-03-17 DIAGNOSIS — M545 Low back pain, unspecified: Secondary | ICD-10-CM | POA: Diagnosis not present

## 2024-03-17 DIAGNOSIS — G20A1 Parkinson's disease without dyskinesia, without mention of fluctuations: Secondary | ICD-10-CM | POA: Diagnosis not present

## 2024-03-17 DIAGNOSIS — M5416 Radiculopathy, lumbar region: Secondary | ICD-10-CM

## 2024-03-17 DIAGNOSIS — Z862 Personal history of diseases of the blood and blood-forming organs and certain disorders involving the immune mechanism: Secondary | ICD-10-CM | POA: Diagnosis not present

## 2024-03-17 DIAGNOSIS — G20B1 Parkinson's disease with dyskinesia, without mention of fluctuations: Secondary | ICD-10-CM

## 2024-03-19 ENCOUNTER — Other Ambulatory Visit: Payer: Self-pay | Admitting: Internal Medicine

## 2024-03-19 DIAGNOSIS — M549 Dorsalgia, unspecified: Secondary | ICD-10-CM

## 2024-03-21 ENCOUNTER — Encounter: Payer: Self-pay | Admitting: Internal Medicine

## 2024-03-23 ENCOUNTER — Ambulatory Visit: Payer: Self-pay | Attending: Internal Medicine

## 2024-03-23 ENCOUNTER — Other Ambulatory Visit: Payer: Self-pay

## 2024-03-23 DIAGNOSIS — M5459 Other low back pain: Secondary | ICD-10-CM | POA: Insufficient documentation

## 2024-03-23 DIAGNOSIS — R293 Abnormal posture: Secondary | ICD-10-CM | POA: Diagnosis not present

## 2024-03-23 DIAGNOSIS — R262 Difficulty in walking, not elsewhere classified: Secondary | ICD-10-CM | POA: Diagnosis not present

## 2024-03-23 DIAGNOSIS — R252 Cramp and spasm: Secondary | ICD-10-CM | POA: Diagnosis not present

## 2024-03-23 DIAGNOSIS — M6281 Muscle weakness (generalized): Secondary | ICD-10-CM | POA: Insufficient documentation

## 2024-03-23 NOTE — Therapy (Signed)
 OUTPATIENT PHYSICAL THERAPY THORACOLUMBAR EVALUATION   Patient Name: Dominique Griffin MRN: [id number] DOB:14-Aug-1950, 73 y.o., female Today's Date: 03/23/2024  END OF SESSION:  PT End of Session - 03/23/24 1013     Visit Number 1    Authorization Type [employer]    PT Start Time 1013    PT Stop Time 1101    PT Time Calculation (min) 48 min    Activity Tolerance Patient tolerated treatment well    Behavior During Therapy WFL for tasks assessed/performed          Past Medical History:  Diagnosis Date   Gilbert's syndrome    History of ITP    Osteopenia    Parkinson disease (HCC)    Past Surgical History:  Procedure Laterality Date   ABDOMINAL HYSTERECTOMY  1993   fibroids, ovaries remain   BREAST EXCISIONAL BIOPSY Left    skin bbiopsy   TONSILLECTOMY     Patient Active Problem List   Diagnosis Date Noted   Lumbar back pain with radiculopathy affecting left lower extremity 03/10/2024   Class 1 obesity due to excess calories without serious comorbidity with body mass index (BMI) of 30.0 to 30.9 in adult 12/15/2023   Gallstones 02/25/2023   B12 deficiency 01/07/2023   Dizziness - PPPD 10/24/2022   Chronic ITP (idiopathic thrombocytopenia) (HCC) 09/07/2022   Clinical trial exam 06/27/2021   Osteopenia 11/13/2020   Vitamin D deficiency 11/13/2020   Diverticulosis of colon 11/13/2020   Family history of colon cancer 11/13/2020   Arthritis 09/27/2020   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   Coccyx pain 09/27/2020   Parkinson's disease - Dr Evonnie 12/02/2019    PCP: Geofm Glade PARAS, MD  REFERRING PROVIDER: Geofm Glade PARAS, MD  REFERRING DIAG: M54.9 (ICD-10-CM) - Back pain, unspecified back location, unspecified back pain laterality, unspecified chronicity  Rationale for Evaluation and Treatment: Rehabilitation  THERAPY DIAG:  Other low back pain - Plan: PT plan of care cert/re-cert  Difficulty in walking, not elsewhere classified - Plan: PT plan of care  cert/re-cert  Muscle weakness (generalized) - Plan: PT plan of care cert/re-cert  Cramp and spasm - Plan: PT plan of care cert/re-cert  ONSET DATE: 03/19/2024  SUBJECTIVE:                                                                                                                                                                                           SUBJECTIVE STATEMENT: Back pain since she was in her 40's. In her 50's fell from a ladder.  In her 61's, a lot of OA found.  In her 27's, diagnosed with Parkinsons.  Now very active doing [employer], multiple Parkinsons classes, Tai Chi, multiple stretching classes to try and deal with the parkinsons symptoms.  She was doing happy baby and felt pain in the low back flare up.  She was prescribed dose pack and other meds.  She hopes to eliminate the back pain so that she can continue working on controlling her Parkinson's symptoms.    PERTINENT HISTORY:  Parkinson's    PAIN:  Are you having pain? Yes: NPRS scale: 2-3/10, at worst 4-5/10 at worst  Pain location: Left lower back, left lateral LE, occasional left lateral knee Pain description: aching Aggravating factors: standing Relieving factors: exercise, meds  PRECAUTIONS: None  RED FLAGS: None   WEIGHT BEARING RESTRICTIONS: No  FALLS:  Has patient fallen in last 6 months? No  LIVING ENVIRONMENT: Lives with: lives with their family Lives in: House/apartment Stairs: Yes: Internal: 17 steps; on right going up and External: 5 steps; on right going up Has following equipment at home: None  OCCUPATION: retired   PLOF: Independent, Independent with basic ADLs, Independent with household mobility without device, Independent with community mobility without device, Independent with homemaking with ambulation, Independent with gait, and Independent with transfers  PATIENT GOALS: To be able to continue to exercise to reduce her Parkinson's symptoms  NEXT MD VISIT: prn  OBJECTIVE:   Note: Objective measures were completed at Evaluation unless otherwise noted.  DIAGNOSTIC FINDINGS:  na  PATIENT SURVEYS:  Modified Oswestry:  MODIFIED OSWESTRY DISABILITY SCALE  Date: 03/23/24 Score  Total 14/50   Interpretation of scores: Score Category Description  0-20% Minimal Disability The patient can cope with most living activities. Usually no treatment is indicated apart from advice on lifting, sitting and exercise  21-40% Moderate Disability The patient experiences more pain and difficulty with sitting, lifting and standing. Travel and social life are more difficult and they may be disabled from work. Personal care, sexual activity and sleeping are not grossly affected, and the patient can usually be managed by conservative means  41-60% Severe Disability Pain remains the main problem in this group, but activities of daily living are affected. These patients require a detailed investigation  61-80% Crippled Back pain impinges on all aspects of the patient's life. Positive intervention is required  81-100% Bed-bound  These patients are either bed-bound or exaggerating their symptoms  [name], et al. Surgery versus conservative management of stable thoracolumbar fracture: the PRESTO feasibility RCT. [place] ([place]): [employer]; 2021 Nov. [facility] Technology Assessment, No. 25.62.) Appendix 3, Oswestry Disability Index category descriptors. Available from: FindJewelers.cz  Minimally Clinically Important Difference (MCID) = 12.8%  COGNITION: Overall cognitive status: Within functional limits for tasks assessed     SENSATION: Occasional pain down the left outer thigh and into the knee but appears to be referred vs radicular  MUSCLE LENGTH: Hamstrings: Right 55 deg; Left 50 deg Thomas test: Right pos; Left pos  POSTURE: Scoliosis obvious, right trunk shift, even more pronounced lying down  PALPATION: Tender left  IT band  LUMBAR ROM:   AROM eval  Flexion WNL  Extension 50%  Right lateral flexion WNL  Left lateral flexion Fingertips to just above joint line  Right rotation WNL  Left rotation WNL   (Blank rows = not tested)  LOWER EXTREMITY ROM:     WFL  LOWER EXTREMITY MMT:    Generally 4 to 4+/5 with exception of hip extension 3+ bilaterally, hip abduction 4- right hip, 3+ left hip  LUMBAR SPECIAL TESTS:  Straight leg raise test: Negative, FABER test: Negative, and Thomas test: Positive  FUNCTIONAL TESTS:  5 times sit to stand: 10.74 sec Timed up and go (TUG): 9.40 sec  GAIT: Distance walked: 30 feet Assistive device utilized: None Level of assistance: Modified independence Comments: fairly normal heel to toe progression  TREATMENT DATE:  03/23/24    Patient evaluation completed and initiated HEP Educated on proper sleeping posture and lumbar support, Educated on appropriate exercises and those to avoid to reduce stress to the lumbar spine                                                                                                                          PATIENT EDUCATION:  Education details: initiated HEP, Educated on proper sleeping posture and lumbar support, Educated on appropriate exercises and those to avoid to reduce stress to the lumbar spine Person educated: Patient Education method: Programmer, multimedia, Facilities manager, Verbal cues, and Handouts Education comprehension: verbalized understanding, returned demonstration, verbal cues required, and tactile cues required  HOME EXERCISE PROGRAM: Access Code: [email or web address] URL: [email or web address] Date: 03/23/2024 Prepared by: Delon Haddock  Exercises - Standing Hamstring Stretch on Chair  - 1 x daily - 7 x weekly - 1 sets - 3 reps - 30 sec hold - Quadricep Stretch with Chair and Counter Support  - 1 x daily - 7 x weekly - 1 sets - 3 reps - 30 sec hold - Seated Figure 4 Piriformis Stretch  - 1 x daily - 7 x  weekly - 1 sets - 3 reps - 30 sed hold - Seated Lateral Trunk Stretch on Swiss Ball  - 1 x daily - 7 x weekly - 1 sets - 10 reps - 5 sec hold  ASSESSMENT:  CLINICAL IMPRESSION: Patient is a 73 y.o. female who was seen today for physical therapy evaluation and treatment for low back pain.   She presents with decreased lumbar ROM, tight hamstrings and hip flexors/quads bilaterally, tight left IT band with tenderness to palpation, decreased strength bilateral proximal hip, negative SLR test, negative for any radicular symptoms at this time and elevated pain.  She would respond well to skilled PT for LE flexibility, core and hip strengthening, education on proper posture and body mechanics and pain control.    OBJECTIVE IMPAIRMENTS: difficulty walking, decreased ROM, decreased strength, increased fascial restrictions, increased muscle spasms, impaired flexibility, improper body mechanics, postural dysfunction, and pain.   ACTIVITY LIMITATIONS: carrying, lifting, bending, standing, squatting, sleeping, stairs, transfers, bed mobility, bathing, toileting, dressing, and caring for others  PARTICIPATION LIMITATIONS: meal prep, cleaning, laundry, driving, shopping, community activity, and yard work  PERSONAL FACTORS: Fitness and 1-2 comorbidities: Parkinsons and Gilberts syndrome are also affecting patient's functional outcome.   REHAB POTENTIAL: Good  CLINICAL DECISION MAKING: Evolving/moderate complexity  EVALUATION COMPLEXITY: Moderate   GOALS: Goals reviewed with patient? Yes  SHORT TERM GOALS: Target date: 04/20/2024  Pain report to be no greater than 4/10  Baseline:  Goal status: INITIAL  2.  Patient will be independent with initial HEP  Baseline:  Goal status: INITIAL  3.  Patient to report 30% or better improvement in overall function Baseline:  Goal status: INITIAL   LONG TERM GOALS: Target date: 05/18/2024  Patient to report pain no greater than 2/10  Baseline:  Goal  status: INITIAL  2.  Patient to be independent with advanced HEP  Baseline:  Goal status: INITIAL  3.  Patients Oswestry score to improve by 3-5 points Baseline:  Goal status: INITIAL  4.  Functional scores to improve by 2-3 seconds Baseline:  Goal status: INITIAL  5.  Patient to be able to sleep through the night  Baseline:  Goal status: INITIAL  6.  Patient to report 85% improvement in overall symptoms Baseline:  Goal status: INITIAL  PLAN:  PT FREQUENCY: 1-2x/week  PT DURATION: 8 weeks  PLANNED INTERVENTIONS: 97110-Therapeutic exercises, 97530- Therapeutic activity, [id number]- Neuromuscular re-education, 97535- Self Care, 02859- Manual therapy, [phone]- Gait training, [phone]- Canalith repositioning, [id number]- Aquatic Therapy, [phone]- Electrical stimulation (unattended), [id number]- Electrical stimulation (manual), [id number]- Ultrasound, [id number]- Traction (mechanical), [id number]- Ionotophoresis 4mg /ml Dexamethasone , Patient/Family education, Balance training, Stair training, Taping, Joint mobilization, Spinal mobilization, Vestibular training, DME instructions, Cryotherapy, and Moist heat.  PLAN FOR NEXT SESSION: Review HEP, Nustep, begin core strengthening   Litsy Epting B. Purvi Ruehl, PT 03/23/24 1:52 PM [facility] [address] [place], [place] [address] Phone # [phone] Fax [phone]

## 2024-03-25 ENCOUNTER — Encounter: Payer: Self-pay | Admitting: Internal Medicine

## 2024-03-26 ENCOUNTER — Ambulatory Visit: Admitting: Physical Therapy

## 2024-03-26 ENCOUNTER — Encounter: Payer: Self-pay | Admitting: Physical Therapy

## 2024-03-26 DIAGNOSIS — R252 Cramp and spasm: Secondary | ICD-10-CM | POA: Diagnosis not present

## 2024-03-26 DIAGNOSIS — R293 Abnormal posture: Secondary | ICD-10-CM | POA: Diagnosis not present

## 2024-03-26 DIAGNOSIS — M6281 Muscle weakness (generalized): Secondary | ICD-10-CM

## 2024-03-26 DIAGNOSIS — M5459 Other low back pain: Secondary | ICD-10-CM

## 2024-03-26 DIAGNOSIS — R262 Difficulty in walking, not elsewhere classified: Secondary | ICD-10-CM

## 2024-03-26 NOTE — Therapy (Signed)
 OUTPATIENT PHYSICAL THERAPY THORACOLUMBAR TREATMENT   Patient Name: Dominique Griffin MRN: [id number] DOB:Apr 09, 1951, 73 y.o., female Today's Date: 03/26/2024  END OF SESSION:  PT End of Session - 03/26/24 1029     Visit Number 2    Date for Recertification  05/18/24    Authorization Type [employer] Approved 16 visits-03/23/24-06/21/2024-auth#[id number]    Authorization Time Period 03/23/24-06/21/2024    Authorization - Visit Number 2    Authorization - Number of Visits 16    Progress Note Due on Visit 10    PT Start Time 0933    PT Stop Time 1015    PT Time Calculation (min) 42 min    Activity Tolerance Patient tolerated treatment well    Behavior During Therapy WFL for tasks assessed/performed           Past Medical History:  Diagnosis Date   Gilbert's syndrome    History of ITP    Osteopenia    Parkinson disease (HCC)    Past Surgical History:  Procedure Laterality Date   ABDOMINAL HYSTERECTOMY  1993   fibroids, ovaries remain   BREAST EXCISIONAL BIOPSY Left    skin bbiopsy   TONSILLECTOMY     Patient Active Problem List   Diagnosis Date Noted   Lumbar back pain with radiculopathy affecting left lower extremity 03/10/2024   Class 1 obesity due to excess calories without serious comorbidity with body mass index (BMI) of 30.0 to 30.9 in adult 12/15/2023   Gallstones 02/25/2023   B12 deficiency 01/07/2023   Dizziness - PPPD 10/24/2022   Chronic ITP (idiopathic thrombocytopenia) (HCC) 09/07/2022   Clinical trial exam 06/27/2021   Osteopenia 11/13/2020   Vitamin D deficiency 11/13/2020   Diverticulosis of colon 11/13/2020   Family history of colon cancer 11/13/2020   Arthritis 09/27/2020   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   Coccyx pain 09/27/2020   Parkinson's disease - Dr Evonnie 12/02/2019    PCP: Dominique Glade PARAS, MD  REFERRING PROVIDER: Geofm Glade PARAS, MD  REFERRING DIAG: M54.9 (ICD-10-CM) - Back pain, unspecified back location, unspecified back pain laterality,  unspecified chronicity  Rationale for Evaluation and Treatment: Rehabilitation  THERAPY DIAG:  Other low back pain  Difficulty in walking, not elsewhere classified  Muscle weakness (generalized)  Cramp and spasm  ONSET DATE: 03/19/2024  SUBJECTIVE:                                                                                                                                                                                           SUBJECTIVE STATEMENT: Patient verbalized a little soreness in her back but the pain is better. She has been  compliant with HEP.   From Eval: Back pain since she was in her 40's. In her 50's fell from a ladder.  In her 79's, a lot of OA found.  In her 27's, diagnosed with Parkinsons.  Now very active doing [employer], multiple Parkinsons classes, Tai Chi, multiple stretching classes to try and deal with the parkinsons symptoms.  She was doing happy baby and felt pain in the low back flare up.  She was prescribed dose pack and other meds.  She hopes to eliminate the back pain so that she can continue working on controlling her Parkinson's symptoms.    PERTINENT HISTORY:  Parkinson's    PAIN:  Are you having pain? Yes: NPRS scale: 2-3/10, at worst 4-5/10 at worst  Pain location: Left lower back, left lateral LE, occasional left lateral knee Pain description: aching Aggravating factors: standing Relieving factors: exercise, meds  PRECAUTIONS: None  RED FLAGS: None   WEIGHT BEARING RESTRICTIONS: No  FALLS:  Has patient fallen in last 6 months? No  LIVING ENVIRONMENT: Lives with: lives with their family Lives in: House/apartment Stairs: Yes: Internal: 17 steps; on right going up and External: 5 steps; on right going up Has following equipment at home: None  OCCUPATION: retired   PLOF: Independent, Independent with basic ADLs, Independent with household mobility without device, Independent with community mobility without device, Independent with  homemaking with ambulation, Independent with gait, and Independent with transfers  PATIENT GOALS: To be able to continue to exercise to reduce her Parkinson's symptoms  NEXT MD VISIT: prn  OBJECTIVE:  Note: Objective measures were completed at Evaluation unless otherwise noted.  DIAGNOSTIC FINDINGS:  na  PATIENT SURVEYS:  Modified Oswestry:  MODIFIED OSWESTRY DISABILITY SCALE  Date: 03/23/24 Score  Total 14/50   Interpretation of scores: Score Category Description  0-20% Minimal Disability The patient can cope with most living activities. Usually no treatment is indicated apart from advice on lifting, sitting and exercise  21-40% Moderate Disability The patient experiences more pain and difficulty with sitting, lifting and standing. Travel and social life are more difficult and they may be disabled from work. Personal care, sexual activity and sleeping are not grossly affected, and the patient can usually be managed by conservative means  41-60% Severe Disability Pain remains the main problem in this group, but activities of daily living are affected. These patients require a detailed investigation  61-80% Crippled Back pain impinges on all aspects of the patient's life. Positive intervention is required  81-100% Bed-bound  These patients are either bed-bound or exaggerating their symptoms  Dominique Griffin, et al. Surgery versus conservative management of stable thoracolumbar fracture: the PRESTO feasibility RCT. [place] ([place]): [employer]; 2021 Nov. [facility] Technology Assessment, No. 25.62.) Appendix 3, Oswestry Disability Index category descriptors. Available from: FindJewelers.cz  Minimally Clinically Important Difference (MCID) = 12.8%  COGNITION: Overall cognitive status: Within functional limits for tasks assessed     SENSATION: Occasional pain down the left outer thigh and into the knee but appears to be referred vs  radicular  MUSCLE LENGTH: Hamstrings: Right 55 deg; Left 50 deg Thomas test: Right pos; Left pos  POSTURE: Scoliosis obvious, right trunk shift, even more pronounced lying down  PALPATION: Tender left IT band  LUMBAR ROM:   AROM eval  Flexion WNL  Extension 50%  Right lateral flexion WNL  Left lateral flexion Fingertips to just above joint line  Right rotation WNL  Left rotation WNL   (Blank  rows = not tested)  LOWER EXTREMITY ROM:     WFL  LOWER EXTREMITY MMT:    Generally 4 to 4+/5 with exception of hip extension 3+ bilaterally, hip abduction 4- right hip, 3+ left hip  LUMBAR SPECIAL TESTS:  Straight leg raise test: Negative, FABER test: Negative, and Thomas test: Positive  FUNCTIONAL TESTS:  5 times sit to stand: 10.74 sec Timed up and go (TUG): 9.40 sec  GAIT: Distance walked: 30 feet Assistive device utilized: None Level of assistance: Modified independence Comments: fairly normal heel to toe progression  TREATMENT DATE:  03/26/2024 NuStep Level 4 5 mins- PT present to discuss status Standing hamstring stretch at stair 3 x 30 sec bilateral  Quadriceps stretch with stair 3 x 30 sec bilateral  Supine piriformis stretch 3 x 30 sec bilateral  Supine PPT 2 x 10 Supine PPT + bent knee fall out  x 10 bilateral   Supine PPT + march x 10 bilateral  Hooklying alt hand and knee press with purple ball x 10 bilateral  Manual with Addady to left lumbar paraspinals for improved muscle elongation and decreased pain    03/23/24    Patient evaluation completed and initiated HEP Educated on proper sleeping posture and lumbar support, Educated on appropriate exercises and those to avoid to reduce stress to the lumbar spine                                                                                                                          PATIENT EDUCATION:  Education details: initiated HEP, Educated on proper sleeping posture and lumbar support, Educated on  appropriate exercises and those to avoid to reduce stress to the lumbar spine Person educated: Patient Education method: Programmer, multimedia, Facilities manager, Verbal cues, and Handouts Education comprehension: verbalized understanding, returned demonstration, verbal cues required, and tactile cues required  HOME EXERCISE PROGRAM: Access Code: [email or web address] URL: [email or web address] Date: 03/26/2024 Prepared by: Kristeen Sar  Exercises - Standing Hamstring Stretch on Chair  - 1 x daily - 7 x weekly - 1 sets - 3 reps - 30 sec hold - Quadricep Stretch with Chair and Counter Support  - 1 x daily - 7 x weekly - 1 sets - 3 reps - 30 sec hold - Seated Figure 4 Piriformis Stretch  - 1 x daily - 7 x weekly - 1 sets - 3 reps - 30 sed hold - Seated Lateral Trunk Stretch on Swiss Ball  - 1 x daily - 7 x weekly - 1 sets - 10 reps - 5 sec hold - Supine Posterior Pelvic Tilt  - 1 x daily - 7 x weekly - 2 sets - 10 reps - Bent Knee Fallouts  - 1 x daily - 7 x weekly - 1 sets - 10 reps - Supine March with Posterior Pelvic Tilt  - 1 x daily - 7 x weekly - 1 sets - 10 reps  ASSESSMENT:  CLINICAL IMPRESSION: Dominique Griffin presents to skilled therapy for first  follow up appointment after evaluation. She verbalized compliance with HEP and required minimal verbal cues for form correction. Incorporated TA activation exercises and patient required verbal and tactile cues cues for correct performance. Updated HEP to include exercise progressions. Patient demonstrated good breathing technique carryover. Patient should respond well to skilled therapy. Patient will benefit from skilled PT to address the below impairments and improve overall function.   OBJECTIVE IMPAIRMENTS: difficulty walking, decreased ROM, decreased strength, increased fascial restrictions, increased muscle spasms, impaired flexibility, improper body mechanics, postural dysfunction, and pain.   ACTIVITY LIMITATIONS: carrying, lifting, bending, standing,  squatting, sleeping, stairs, transfers, bed mobility, bathing, toileting, dressing, and caring for others  PARTICIPATION LIMITATIONS: meal prep, cleaning, laundry, driving, shopping, community activity, and yard work  PERSONAL FACTORS: Fitness and 1-2 comorbidities: Parkinsons and Gilberts syndrome are also affecting patient's functional outcome.   REHAB POTENTIAL: Good  CLINICAL DECISION MAKING: Evolving/moderate complexity  EVALUATION COMPLEXITY: Moderate   GOALS: Goals reviewed with patient? Yes  SHORT TERM GOALS: Target date: 04/20/2024  Pain report to be no greater than 4/10  Baseline: Goal status: INITIAL  2.  Patient will be independent with initial HEP  Baseline:  Goal status: INITIAL  3.  Patient to report 30% or better improvement in overall function Baseline:  Goal status: INITIAL   LONG TERM GOALS: Target date: 05/18/2024  Patient to report pain no greater than 2/10  Baseline:  Goal status: INITIAL  2.  Patient to be independent with advanced HEP  Baseline:  Goal status: INITIAL  3.  Patients Oswestry score to improve by 3-5 points Baseline:  Goal status: INITIAL  4.  Functional scores to improve by 2-3 seconds Baseline:  Goal status: INITIAL  5.  Patient to be able to sleep through the night  Baseline:  Goal status: INITIAL  6.  Patient to report 85% improvement in overall symptoms Baseline:  Goal status: INITIAL  PLAN:  PT FREQUENCY: 1-2x/week  PT DURATION: 8 weeks  PLANNED INTERVENTIONS: 97110-Therapeutic exercises, 97530- Therapeutic activity, [id number]- Neuromuscular re-education, 97535- Self Care, 02859- Manual therapy, [phone]- Gait training, [phone]- Canalith repositioning, [id number]- Aquatic Therapy, [id number]- Electrical stimulation (unattended), [id number]- Electrical stimulation (manual), [id number]- Ultrasound, [id number]- Traction (mechanical), [id number]- Ionotophoresis 4mg /ml Dexamethasone , Patient/Family education, Balance training, Stair training, Taping,  Joint mobilization, Spinal mobilization, Vestibular training, DME instructions, Cryotherapy, and Moist heat.  PLAN FOR NEXT SESSION: assess updated HEP; continue TA activation; hinging technique   Kristeen Sar, PT 03/26/24 10:33 AM [facility] [address] [place], [place] [address] Phone # [phone] Fax [phone]

## 2024-03-29 ENCOUNTER — Encounter: Payer: Self-pay | Admitting: Internal Medicine

## 2024-03-30 ENCOUNTER — Other Ambulatory Visit: Payer: Self-pay | Admitting: Internal Medicine

## 2024-03-31 ENCOUNTER — Ambulatory Visit

## 2024-04-02 ENCOUNTER — Ambulatory Visit

## 2024-04-02 DIAGNOSIS — R262 Difficulty in walking, not elsewhere classified: Secondary | ICD-10-CM

## 2024-04-02 DIAGNOSIS — M5459 Other low back pain: Secondary | ICD-10-CM

## 2024-04-02 DIAGNOSIS — R252 Cramp and spasm: Secondary | ICD-10-CM | POA: Diagnosis not present

## 2024-04-02 DIAGNOSIS — R293 Abnormal posture: Secondary | ICD-10-CM | POA: Diagnosis not present

## 2024-04-02 DIAGNOSIS — M6281 Muscle weakness (generalized): Secondary | ICD-10-CM | POA: Diagnosis not present

## 2024-04-02 NOTE — Therapy (Signed)
 OUTPATIENT PHYSICAL THERAPY THORACOLUMBAR TREATMENT   Patient Name: Dominique Griffin MRN: [id number] DOB:[birth year], 73 y.o., female Today's Date: 04/02/2024  END OF SESSION:  PT End of Session - 04/02/24 0931     Visit Number 3    Date for Recertification  05/18/24    Authorization Type [employer] Approved 16 visits-03/23/24-06/21/2024-auth#[id number]    Authorization Time Period 03/23/24-06/21/2024    Authorization - Visit Number 3    Authorization - Number of Visits 16    Progress Note Due on Visit 10    PT Start Time 0932    PT Stop Time 1023    PT Time Calculation (min) 51 min    Activity Tolerance Patient tolerated treatment well    Behavior During Therapy WFL for tasks assessed/performed           Past Medical History:  Diagnosis Date   Gilbert's syndrome    History of ITP    Osteopenia    Parkinson disease (HCC)    Past Surgical History:  Procedure Laterality Date   ABDOMINAL HYSTERECTOMY  1993   fibroids, ovaries remain   BREAST EXCISIONAL BIOPSY Left    skin bbiopsy   TONSILLECTOMY     Patient Active Problem List   Diagnosis Date Noted   Lumbar back pain with radiculopathy affecting left lower extremity 03/10/2024   Class 1 obesity due to excess calories without serious comorbidity with body mass index (BMI) of 30.0 to 30.9 in adult 12/15/2023   Gallstones 02/25/2023   B12 deficiency 01/07/2023   Dizziness - PPPD 10/24/2022   Chronic ITP (idiopathic thrombocytopenia) (HCC) 09/07/2022   Clinical trial exam 06/27/2021   Osteopenia 11/13/2020   Vitamin D deficiency 11/13/2020   Diverticulosis of colon 11/13/2020   Family history of colon cancer 11/13/2020   Arthritis 09/27/2020   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   Coccyx pain 09/27/2020   Parkinson's disease - Dr Evonnie 12/02/2019    PCP: Geofm Glade PARAS, MD  REFERRING PROVIDER: Geofm Glade PARAS, MD  REFERRING DIAG: M54.9 (ICD-10-CM) - Back pain, unspecified back location, unspecified back pain laterality,  unspecified chronicity  Rationale for Evaluation and Treatment: Rehabilitation  THERAPY DIAG:  Other low back pain  Difficulty in walking, not elsewhere classified  Muscle weakness (generalized)  Cramp and spasm  Abnormal posture  ONSET DATE: 03/19/2024  SUBJECTIVE:                                                                                                                                                                                           SUBJECTIVE STATEMENT: Patient verbalized a little soreness in her back but the pain is better.  She has been compliant with HEP.   From Eval: Back pain since she was in her 40's. In her 50's fell from a ladder.  In her 59's, a lot of OA found.  In her 36's, diagnosed with Parkinsons.  Now very active doing [employer], multiple Parkinsons classes, Tai Chi, multiple stretching classes to try and deal with the parkinsons symptoms.  She was doing happy baby and felt pain in the low back flare up.  She was prescribed dose pack and other meds.  She hopes to eliminate the back pain so that she can continue working on controlling her Parkinson's symptoms.    PERTINENT HISTORY:  Parkinson's    PAIN:  Are you having pain? Yes: NPRS scale: 2-3/10, at worst 4-5/10 at worst  Pain location: Left lower back, left lateral LE, occasional left lateral knee Pain description: aching Aggravating factors: standing Relieving factors: exercise, meds  PRECAUTIONS: None  RED FLAGS: None   WEIGHT BEARING RESTRICTIONS: No  FALLS:  Has patient fallen in last 6 months? No  LIVING ENVIRONMENT: Lives with: lives with their family Lives in: House/apartment Stairs: Yes: Internal: 17 steps; on right going up and External: 5 steps; on right going up Has following equipment at home: None  OCCUPATION: retired   PLOF: Independent, Independent with basic ADLs, Independent with household mobility without device, Independent with community mobility without  device, Independent with homemaking with ambulation, Independent with gait, and Independent with transfers  PATIENT GOALS: To be able to continue to exercise to reduce her Parkinson's symptoms  NEXT MD VISIT: prn  OBJECTIVE:  Note: Objective measures were completed at Evaluation unless otherwise noted.  DIAGNOSTIC FINDINGS:  na  PATIENT SURVEYS:  Modified Oswestry:  MODIFIED OSWESTRY DISABILITY SCALE  Date: 03/23/24 Score  Total 14/50   Interpretation of scores: Score Category Description  0-20% Minimal Disability The patient can cope with most living activities. Usually no treatment is indicated apart from advice on lifting, sitting and exercise  21-40% Moderate Disability The patient experiences more pain and difficulty with sitting, lifting and standing. Travel and social life are more difficult and they may be disabled from work. Personal care, sexual activity and sleeping are not grossly affected, and the patient can usually be managed by conservative means  41-60% Severe Disability Pain remains the main problem in this group, but activities of daily living are affected. These patients require a detailed investigation  61-80% Crippled Back pain impinges on all aspects of the patient's life. Positive intervention is required  81-100% Bed-bound  These patients are either bed-bound or exaggerating their symptoms  [name], et al. Surgery versus conservative management of stable thoracolumbar fracture: the PRESTO feasibility RCT. [place] ([place]): [employer]; 2021 Nov. [facility] Technology Assessment, No. 25.62.) Appendix 3, Oswestry Disability Index category descriptors. Available from: FindJewel[employer].cz  Minimally Clinically Important Difference (MCID) = 12.8%  COGNITION: Overall cognitive status: Within functional limits for tasks assessed     SENSATION: Occasional pain down the left outer thigh and into the knee but  appears to be referred vs radicular  MUSCLE LENGTH: Hamstrings: Right 55 deg; Left 50 deg Thomas test: Right pos; Left pos  POSTURE: Scoliosis obvious, right trunk shift, even more pronounced lying down  PALPATION: Tender left IT band  LUMBAR ROM:   AROM eval  Flexion WNL  Extension 50%  Right lateral flexion WNL  Left lateral flexion Fingertips to just above joint line  Right rotation WNL  Left rotation WNL   (  Blank rows = not tested)  LOWER EXTREMITY ROM:     WFL  LOWER EXTREMITY MMT:    Generally 4 to 4+/5 with exception of hip extension 3+ bilaterally, hip abduction 4- right hip, 3+ left hip  LUMBAR SPECIAL TESTS:  Straight leg raise test: Negative, FABER test: Negative, and Thomas test: Positive  FUNCTIONAL TESTS:  5 times sit to stand: 10.74 sec Timed up and go (TUG): 9.40 sec  GAIT: Distance walked: 30 feet Assistive device utilized: None Level of assistance: Modified independence Comments: fairly normal heel to toe progression  TREATMENT DATE:  04/02/2024 Seated hamstring stretch 3 x 30 sec bilateral (Nustep not available initially) Instruction on trunk shift with mirror for visual feedback, instructed patient to do right side trunk stretch NuStep Level 4 5 mins- PT present to discuss status Quadriceps stretch with  3 x 30 sec bilateral at 20 inch side of rit fit box with UE support on back of chair (vc's to do PPT to reduce stenosis and pain) Seated piriformis stretch 3 x 30 sec bilateral  Supine PPT 2 x 10 Supine PPT + march x 10 bilateral vc's for maintaining tilt Supine PPT + shoulder extension with yellow tband 2 x 10 Supine PPT + shoulder extension with yellow tband with knee flexion 2 x 10 Ice x 10 min to lumbar spine at end of session.  03/26/2024 NuStep Level 4 5 mins- PT present to discuss status Standing hamstring stretch at stair 3 x 30 sec bilateral  Quadriceps stretch with stair 3 x 30 sec bilateral  Supine piriformis stretch 3 x 30 sec  bilateral  Supine PPT 2 x 10 Supine PPT + bent knee fall out  x 10 bilateral   Supine PPT + march x 10 bilateral  Hooklying alt hand and knee press with purple ball x 10 bilateral  Manual with Addady to left lumbar paraspinals for improved muscle elongation and decreased pain    03/23/24    Patient evaluation completed and initiated HEP Educated on proper sleeping posture and lumbar support, Educated on appropriate exercises and those to avoid to reduce stress to the lumbar spine                                                                                                                          PATIENT EDUCATION:  Education details: initiated HEP, Educated on proper sleeping posture and lumbar support, Educated on appropriate exercises and those to avoid to reduce stress to the lumbar spine Person educated: Patient Education method: Programmer, multimedia, Facilities manager, Verbal cues, and Handouts Education comprehension: verbalized understanding, returned demonstration, verbal cues required, and tactile cues required  HOME EXERCISE PROGRAM: Access Code: [email or web address] URL: [email or web address] Date: 03/26/2024 Prepared by: Kristeen Sar  Exercises - Standing Hamstring Stretch on Chair  - 1 x daily - 7 x weekly - 1 sets - 3 reps - 30 sec hold - Quadricep Stretch with Chair and Counter Support  - 1 x daily - 7 x  weekly - 1 sets - 3 reps - 30 sec hold - Seated Figure 4 Piriformis Stretch  - 1 x daily - 7 x weekly - 1 sets - 3 reps - 30 sed hold - Seated Lateral Trunk Stretch on Swiss Ball  - 1 x daily - 7 x weekly - 1 sets - 10 reps - 5 sec hold - Supine Posterior Pelvic Tilt  - 1 x daily - 7 x weekly - 2 sets - 10 reps - Bent Knee Fallouts  - 1 x daily - 7 x weekly - 1 sets - 10 reps - Supine March with Posterior Pelvic Tilt  - 1 x daily - 7 x weekly - 1 sets - 10 reps  ASSESSMENT:  CLINICAL IMPRESSION: [place]y had a set back since last visit.  She had to call provider to request  dose pack.  She has not been able to do much due to the pain but was doing well after last session and had done a lot of walking at the [place] [facility].  She is on her 2nd day of dose pack.  She is able to complete all tasks today but we had to modify to avoid positions that caused pain. She was instructed to sit in front of a mirror and work on correcting her trunk shift and stretching her convex side.  She would benefit from continuing skilled PT for flexibility and core strength.     OBJECTIVE IMPAIRMENTS: difficulty walking, decreased ROM, decreased strength, increased fascial restrictions, increased muscle spasms, impaired flexibility, improper body mechanics, postural dysfunction, and pain.   ACTIVITY LIMITATIONS: carrying, lifting, bending, standing, squatting, sleeping, stairs, transfers, bed mobility, bathing, toileting, dressing, and caring for others  PARTICIPATION LIMITATIONS: meal prep, cleaning, laundry, driving, shopping, community activity, and yard work  PERSONAL FACTORS: Fitness and 1-2 comorbidities: Parkinsons and Gilberts syndrome are also affecting patient's functional outcome.   REHAB POTENTIAL: Good  CLINICAL DECISION MAKING: Evolving/moderate complexity  EVALUATION COMPLEXITY: Moderate   GOALS: Goals reviewed with patient? Yes  SHORT TERM GOALS: Target date: 04/20/2024  Pain report to be no greater than 4/10  Baseline: Goal status: INITIAL  2.  Patient will be independent with initial HEP  Baseline:  Goal status: INITIAL  3.  Patient to report 30% or better improvement in overall function Baseline:  Goal status: INITIAL   LONG TERM GOALS: Target date: 05/18/2024  Patient to report pain no greater than 2/10  Baseline:  Goal status: INITIAL  2.  Patient to be independent with advanced HEP  Baseline:  Goal status: INITIAL  3.  Patients Oswestry score to improve by 3-5 points Baseline:  Goal status: INITIAL  4.  Functional scores to improve by  2-3 seconds Baseline:  Goal status: INITIAL  5.  Patient to be able to sleep through the night  Baseline:  Goal status: INITIAL  6.  Patient to report 85% improvement in overall symptoms Baseline:  Goal status: INITIAL  PLAN:  PT FREQUENCY: 1-2x/week  PT DURATION: 8 weeks  PLANNED INTERVENTIONS: 97110-Therapeutic exercises, 97530- Therapeutic activity, [id number]- Neuromuscular re-education, 97535- Self Care, [id number]- Manual therapy, [phone]- Gait training, [phone]- Canalith repositioning, [id number]- Aquatic Therapy, [phone]- Electrical stimulation (unattended), [phone]- Electrical stimulation (manual), [id number]- Ultrasound, [id number]- Traction (mechanical), [id number]- Ionotophoresis 4mg /ml Dexamethasone , Patient/Family education, Balance training, Stair training, Taping, Joint mobilization, Spinal mobilization, Vestibular training, DME instructions, Cryotherapy, and Moist heat.  PLAN FOR NEXT SESSION: Focus on getting pain under control,  correcting shift, assess updated HEP; continue TA activation; hinging  technique   Delon B. Ellery Tash, PT 04/02/24 11:01 AM [facility] [address] [place], [place] [address] Phone # [phone] Fax [phone]

## 2024-04-08 ENCOUNTER — Ambulatory Visit: Attending: Internal Medicine

## 2024-04-08 ENCOUNTER — Encounter: Payer: Self-pay | Admitting: Neurology

## 2024-04-08 DIAGNOSIS — R262 Difficulty in walking, not elsewhere classified: Secondary | ICD-10-CM | POA: Diagnosis not present

## 2024-04-08 DIAGNOSIS — R293 Abnormal posture: Secondary | ICD-10-CM | POA: Diagnosis not present

## 2024-04-08 DIAGNOSIS — R252 Cramp and spasm: Secondary | ICD-10-CM | POA: Insufficient documentation

## 2024-04-08 DIAGNOSIS — M5459 Other low back pain: Secondary | ICD-10-CM | POA: Insufficient documentation

## 2024-04-08 DIAGNOSIS — M6281 Muscle weakness (generalized): Secondary | ICD-10-CM | POA: Diagnosis not present

## 2024-04-08 NOTE — Therapy (Signed)
 OUTPATIENT PHYSICAL THERAPY THORACOLUMBAR TREATMENT   Patient Name: Dominique Griffin MRN: [id number] DOB:[birth year], 73 y.o., female Today's Date: 04/08/2024  END OF SESSION:  PT End of Session - 04/08/24 1247     Visit Number 4    Number of Visits 16    Date for Recertification  05/18/24    Authorization Type [employer] Approved 16 visits-03/23/24-06/21/2024-auth#[id number]    Authorization - Visit Number 4    Authorization - Number of Visits 16    Progress Note Due on Visit 10    PT Start Time 1240    PT Stop Time 1328    PT Time Calculation (min) 48 min    Activity Tolerance Patient tolerated treatment well    Behavior During Therapy WFL for tasks assessed/performed           Past Medical History:  Diagnosis Date   Gilbert's syndrome    History of ITP    Osteopenia    Parkinson disease (HCC)    Past Surgical History:  Procedure Laterality Date   ABDOMINAL HYSTERECTOMY  1993   fibroids, ovaries remain   BREAST EXCISIONAL BIOPSY Left    skin bbiopsy   TONSILLECTOMY     Patient Active Problem List   Diagnosis Date Noted   Lumbar back pain with radiculopathy affecting left lower extremity 03/10/2024   Class 1 obesity due to excess calories without serious comorbidity with body mass index (BMI) of 30.0 to 30.9 in adult 12/15/2023   Gallstones 02/25/2023   B12 deficiency 01/07/2023   Dizziness - PPPD 10/24/2022   Chronic ITP (idiopathic thrombocytopenia) (HCC) 09/07/2022   Clinical trial exam 06/27/2021   Osteopenia 11/13/2020   Vitamin D deficiency 11/13/2020   Diverticulosis of colon 11/13/2020   Family history of colon cancer 11/13/2020   Arthritis 09/27/2020   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   Coccyx pain 09/27/2020   Parkinson's disease - Dr Evonnie 12/02/2019    PCP: Geofm Glade PARAS, MD  REFERRING PROVIDER: Geofm Glade PARAS, MD  REFERRING DIAG: M54.9 (ICD-10-CM) - Back pain, unspecified back location, unspecified back pain laterality, unspecified  chronicity  Rationale for Evaluation and Treatment: Rehabilitation  THERAPY DIAG:  Other low back pain  Muscle weakness (generalized)  Difficulty in walking, not elsewhere classified  Cramp and spasm  Abnormal posture  ONSET DATE: 03/19/2024  SUBJECTIVE:                                                                                                                                                                                           SUBJECTIVE STATEMENT: Patient reports she is better.      From Eval: Back  pain since she was in her 40's. In her 50's fell from a ladder.  In her 70's, a lot of OA found.  In her 68's, diagnosed with Parkinsons.  Now very active doing [employer], multiple Parkinsons classes, Tai Chi, multiple stretching classes to try and deal with the parkinsons symptoms.  She was doing happy baby and felt pain in the low back flare up.  She was prescribed dose pack and other meds.  She hopes to eliminate the back pain so that she can continue working on controlling her Parkinson's symptoms.    PERTINENT HISTORY:  Parkinson's    PAIN:  04/08/24 Are you having pain? Yes: NPRS scale: 2-3/10 Pain location: Left lower back, left lateral LE, occasional left lateral knee Pain description: aching Aggravating factors: standing Relieving factors: exercise, meds  PRECAUTIONS: None  RED FLAGS: None   WEIGHT BEARING RESTRICTIONS: No  FALLS:  Has patient fallen in last 6 months? No  LIVING ENVIRONMENT: Lives with: lives with their family Lives in: House/apartment Stairs: Yes: Internal: 17 steps; on right going up and External: 5 steps; on right going up Has following equipment at home: None  OCCUPATION: retired   PLOF: Independent, Independent with basic ADLs, Independent with household mobility without device, Independent with community mobility without device, Independent with homemaking with ambulation, Independent with gait, and Independent with  transfers  PATIENT GOALS: To be able to continue to exercise to reduce her Parkinson's symptoms  NEXT MD VISIT: prn  OBJECTIVE:  Note: Objective measures were completed at Evaluation unless otherwise noted.  DIAGNOSTIC FINDINGS:  na  PATIENT SURVEYS:  Modified Oswestry:  MODIFIED OSWESTRY DISABILITY SCALE  Date: 03/23/24 Score  Total 14/50   Interpretation of scores: Score Category Description  0-20% Minimal Disability The patient can cope with most living activities. Usually no treatment is indicated apart from advice on lifting, sitting and exercise  21-40% Moderate Disability The patient experiences more pain and difficulty with sitting, lifting and standing. Travel and social life are more difficult and they may be disabled from work. Personal care, sexual activity and sleeping are not grossly affected, and the patient can usually be managed by conservative means  41-60% Severe Disability Pain remains the main problem in this group, but activities of daily living are affected. These patients require a detailed investigation  61-80% Crippled Back pain impinges on all aspects of the patient's life. Positive intervention is required  81-100% Bed-bound  These patients are either bed-bound or exaggerating their symptoms  [name], et al. Surgery versus conservative management of stable thoracolumbar fracture: the PRESTO feasibility RCT. [place] ([place]): [employer]; 2021 Nov. [facility] Technology Assessment, No. 25.62.) Appendix 3, Oswestry Disability Index category descriptors. Available from: FindJewel[employer].cz  Minimally Clinically Important Difference (MCID) = 12.8%  COGNITION: Overall cognitive status: Within functional limits for tasks assessed     SENSATION: Occasional pain down the left outer thigh and into the knee but appears to be referred vs radicular  MUSCLE LENGTH: Hamstrings: Right 55 deg; Left 50 deg Thomas  test: Right pos; Left pos  POSTURE: Scoliosis obvious, right trunk shift, even more pronounced lying down  PALPATION: Tender left IT band  LUMBAR ROM:   AROM eval  Flexion WNL  Extension 50%  Right lateral flexion WNL  Left lateral flexion Fingertips to just above joint line  Right rotation WNL  Left rotation WNL   (Blank rows = not tested)  LOWER EXTREMITY ROM:     [facility]  LOWER EXTREMITY MMT:    Generally 4 to 4+/5 with exception of hip extension 3+ bilaterally, hip abduction 4- right hip, 3+ left hip  LUMBAR SPECIAL TESTS:  Straight leg raise test: Negative, FABER test: Negative, and Thomas test: Positive  FUNCTIONAL TESTS:  5 times sit to stand: 10.74 sec Timed up and go (TUG): 9.40 sec  GAIT: Distance walked: 30 feet Assistive device utilized: None Level of assistance: Modified independence Comments: fairly normal heel to toe progression  TREATMENT DATE:  04/08/2024 NuStep Level 4 5 mins- PT present to discuss status Standing hamstring stretch 3 x 30 each LE Standing quad stretch 3 x 30 sec Instruction on trunk shift with mirror for visual feedback, instructed patient to do right side trunk stretch Right side trunk stretch on steps in lunge position (patient wanted to do both sides) 5 x 10 sec Supine PPT 2 x 10 Supine PPT + march x 10 bilateral vc's for maintaining tilt Supine PPT + shoulder extension with red tband 2 x 10 Supine PPT + shoulder extension with red tband with knee flexion 2 x 10 Supine IT band hip and glut stretch x 5 holding 10 sec each Seated in reclined position: alternating cross body hand to opposite knee with 2 lb dumbbells Ice x 10 min to lumbar spine at end of session.  04/02/2024 Seated hamstring stretch 3 x 30 sec bilateral (Nustep not available initially) Instruction on trunk shift with mirror for visual feedback, instructed patient to do right side trunk stretch NuStep Level 4 5 mins- PT present to discuss status Quadriceps stretch  with  3 x 30 sec bilateral at 20 inch side of rit fit box with UE support on back of chair (vc's to do PPT to reduce stenosis and pain) Seated piriformis stretch 3 x 30 sec bilateral  Supine PPT 2 x 10 Supine PPT + march x 10 bilateral vc's for maintaining tilt Supine PPT + shoulder extension with yellow tband 2 x 10 Supine PPT + shoulder extension with yellow tband with knee flexion 2 x 10 Ice x 10 min to lumbar spine at end of session.  03/26/2024 NuStep Level 4 5 mins- PT present to discuss status Standing hamstring stretch at stair 3 x 30 sec bilateral  Quadriceps stretch with stair 3 x 30 sec bilateral  Supine piriformis stretch 3 x 30 sec bilateral  Supine PPT 2 x 10 Supine PPT + bent knee fall out  x 10 bilateral   Supine PPT + march x 10 bilateral  Hooklying alt hand and knee press with purple ball x 10 bilateral  Manual with Addady to left lumbar paraspinals for improved muscle elongation and decreased pain    03/23/24    Patient evaluation completed and initiated HEP Educated on proper sleeping posture and lumbar support, Educated on appropriate exercises and those to avoid to reduce stress to the lumbar spine  PATIENT EDUCATION:  Education details: initiated HEP, Educated on proper sleeping posture and lumbar support, Educated on appropriate exercises and those to avoid to reduce stress to the lumbar spine Person educated: Patient Education method: Programmer, multimedia, Demonstration, Verbal cues, and Handouts Education comprehension: verbalized understanding, returned demonstration, verbal cues required, and tactile cues required  HOME EXERCISE PROGRAM: Access Code: [email or web address] URL: [email or web address] Date: 03/26/2024 Prepared by: Kristeen Sar  Exercises - Standing Hamstring Stretch on Chair  - 1 x daily - 7 x weekly - 1 sets - 3 reps - 30 sec  hold - Quadricep Stretch with Chair and Counter Support  - 1 x daily - 7 x weekly - 1 sets - 3 reps - 30 sec hold - Seated Figure 4 Piriformis Stretch  - 1 x daily - 7 x weekly - 1 sets - 3 reps - 30 sed hold - Seated Lateral Trunk Stretch on Swiss Ball  - 1 x daily - 7 x weekly - 1 sets - 10 reps - 5 sec hold - Supine Posterior Pelvic Tilt  - 1 x daily - 7 x weekly - 2 sets - 10 reps - Bent Knee Fallouts  - 1 x daily - 7 x weekly - 1 sets - 10 reps - Supine March with Posterior Pelvic Tilt  - 1 x daily - 7 x weekly - 1 sets - 10 reps  ASSESSMENT:  CLINICAL IMPRESSION: Amari responded well to dose pack and is doing well.  She was able to do all exercises today with no increase in pain.  She was able to tolerate addition of seated core strengthening.  She is well motivated and compliant.  She should continue to do well.   She would benefit from continuing skilled PT for flexibility and core strength.     OBJECTIVE IMPAIRMENTS: difficulty walking, decreased ROM, decreased strength, increased fascial restrictions, increased muscle spasms, impaired flexibility, improper body mechanics, postural dysfunction, and pain.   ACTIVITY LIMITATIONS: carrying, lifting, bending, standing, squatting, sleeping, stairs, transfers, bed mobility, bathing, toileting, dressing, and caring for others  PARTICIPATION LIMITATIONS: meal prep, cleaning, laundry, driving, shopping, community activity, and yard work  PERSONAL FACTORS: Fitness and 1-2 comorbidities: Parkinsons and Gilberts syndrome are also affecting patient's functional outcome.   REHAB POTENTIAL: Good  CLINICAL DECISION MAKING: Evolving/moderate complexity  EVALUATION COMPLEXITY: Moderate   GOALS: Goals reviewed with patient? Yes  SHORT TERM GOALS: Target date: 04/20/2024  Pain report to be no greater than 4/10  Baseline: Goal status: INITIAL  2.  Patient will be independent with initial HEP  Baseline:  Goal status: INITIAL  3.   Patient to report 30% or better improvement in overall function Baseline:  Goal status: INITIAL   LONG TERM GOALS: Target date: 05/18/2024  Patient to report pain no greater than 2/10  Baseline:  Goal status: INITIAL  2.  Patient to be independent with advanced HEP  Baseline:  Goal status: INITIAL  3.  Patients Oswestry score to improve by 3-5 points Baseline:  Goal status: INITIAL  4.  Functional scores to improve by 2-3 seconds Baseline:  Goal status: INITIAL  5.  Patient to be able to sleep through the night  Baseline:  Goal status: INITIAL  6.  Patient to report 85% improvement in overall symptoms Baseline:  Goal status: INITIAL  PLAN:  PT FREQUENCY: 1-2x/week  PT DURATION: 8 weeks  PLANNED INTERVENTIONS: 97110-Therapeutic exercises, 97530- Therapeutic activity, [id number]- Neuromuscular re-education, 97535- Self Care, [id number]- Manual therapy, [id number]- Gait training, [phone]- Canalith  repositioning, 02886- Aquatic Therapy, [phone]- Electrical stimulation (unattended), [phone]- Electrical stimulation (manual), [id number]- Ultrasound, [id number]- Traction (mechanical), [id number]- Ionotophoresis 4mg /ml Dexamethasone , Patient/Family education, Balance training, Stair training, Taping, Joint mobilization, Spinal mobilization, Vestibular training, DME instructions, Cryotherapy, and Moist heat.  PLAN FOR NEXT SESSION: continue LE flexibility, core strengthening, correcting trunk shift,  continue TA activation; hinging technique   Kimberleigh Mehan B. Juliyah Mergen, PT 04/08/24 7:12 PM [facility] [address] [place], [place] [address] Phone # [phone] Fax [phone]

## 2024-04-10 ENCOUNTER — Ambulatory Visit

## 2024-04-10 DIAGNOSIS — R293 Abnormal posture: Secondary | ICD-10-CM | POA: Diagnosis not present

## 2024-04-10 DIAGNOSIS — R252 Cramp and spasm: Secondary | ICD-10-CM | POA: Diagnosis not present

## 2024-04-10 DIAGNOSIS — M5459 Other low back pain: Secondary | ICD-10-CM | POA: Diagnosis not present

## 2024-04-10 DIAGNOSIS — M6281 Muscle weakness (generalized): Secondary | ICD-10-CM

## 2024-04-10 DIAGNOSIS — R262 Difficulty in walking, not elsewhere classified: Secondary | ICD-10-CM

## 2024-04-10 NOTE — Therapy (Signed)
 OUTPATIENT PHYSICAL THERAPY THORACOLUMBAR TREATMENT   Patient Name: Dominique Griffin MRN: [id number] DOB:Nov 09, 1950, 73 y.o., female Today's Date: 04/12/2024  END OF SESSION:     Past Medical History:  Diagnosis Date   Gilbert's syndrome    History of ITP    Osteopenia    Parkinson disease (HCC)    Past Surgical History:  Procedure Laterality Date   ABDOMINAL HYSTERECTOMY  1993   fibroids, ovaries remain   BREAST EXCISIONAL BIOPSY Left    skin bbiopsy   TONSILLECTOMY     Patient Active Problem List   Diagnosis Date Noted   Lumbar back pain with radiculopathy affecting left lower extremity 03/10/2024   Class 1 obesity due to excess calories without serious comorbidity with body mass index (BMI) of 30.0 to 30.9 in adult 12/15/2023   Gallstones 02/25/2023   B12 deficiency 01/07/2023   Dizziness - PPPD 10/24/2022   Chronic ITP (idiopathic thrombocytopenia) (HCC) 09/07/2022   Clinical trial exam 06/27/2021   Osteopenia 11/13/2020   Vitamin D deficiency 11/13/2020   Diverticulosis of colon 11/13/2020   Family history of colon cancer 11/13/2020   Arthritis 09/27/2020   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   Coccyx pain 09/27/2020   Parkinson's disease - Dr Evonnie 12/02/2019    PCP: Geofm Glade PARAS, MD  REFERRING PROVIDER: Geofm Glade PARAS, MD  REFERRING DIAG: M54.9 (ICD-10-CM) - Back pain, unspecified back location, unspecified back pain laterality, unspecified chronicity  Rationale for Evaluation and Treatment: Rehabilitation  THERAPY DIAG:  Other low back pain  Difficulty in walking, not elsewhere classified  Cramp and spasm  Muscle weakness (generalized)  Abnormal posture  ONSET DATE: 03/19/2024  SUBJECTIVE:                                                                                                                                                                                           SUBJECTIVE STATEMENT: Patient reports she is better.      From Eval: Back  pain since she was in her 40's. In her 50's fell from a ladder.  In her 26's, a lot of OA found.  In her 46's, diagnosed with Parkinsons.  Now very active doing [employer], multiple Parkinsons classes, Tai Chi, multiple stretching classes to try and deal with the parkinsons symptoms.  She was doing happy baby and felt pain in the low back flare up.  She was prescribed dose pack and other meds.  She hopes to eliminate the back pain so that she can continue working on controlling her Parkinson's symptoms.    PERTINENT HISTORY:  Parkinson's    PAIN:  04/08/24 Are you having pain?  Yes: NPRS scale: 2-3/10 Pain location: Left lower back, left lateral LE, occasional left lateral knee Pain description: aching Aggravating factors: standing Relieving factors: exercise, meds  PRECAUTIONS: None  RED FLAGS: None   WEIGHT BEARING RESTRICTIONS: No  FALLS:  Has patient fallen in last 6 months? No  LIVING ENVIRONMENT: Lives with: lives with their family Lives in: House/apartment Stairs: Yes: Internal: 17 steps; on right going up and External: 5 steps; on right going up Has following equipment at home: None  OCCUPATION: retired   PLOF: Independent, Independent with basic ADLs, Independent with household mobility without device, Independent with community mobility without device, Independent with homemaking with ambulation, Independent with gait, and Independent with transfers  PATIENT GOALS: To be able to continue to exercise to reduce her Parkinson's symptoms  NEXT MD VISIT: prn  OBJECTIVE:  Note: Objective measures were completed at Evaluation unless otherwise noted.  DIAGNOSTIC FINDINGS:  na  PATIENT SURVEYS:  Modified Oswestry:  MODIFIED OSWESTRY DISABILITY SCALE  Date: 03/23/24 Score  Total 14/50   Interpretation of scores: Score Category Description  0-20% Minimal Disability The patient can cope with most living activities. Usually no treatment is indicated apart from advice on  lifting, sitting and exercise  21-40% Moderate Disability The patient experiences more pain and difficulty with sitting, lifting and standing. Travel and social life are more difficult and they may be disabled from work. Personal care, sexual activity and sleeping are not grossly affected, and the patient can usually be managed by conservative means  41-60% Severe Disability Pain remains the main problem in this group, but activities of daily living are affected. These patients require a detailed investigation  61-80% Crippled Back pain impinges on all aspects of the patient's life. Positive intervention is required  81-100% Bed-bound  These patients are either bed-bound or exaggerating their symptoms  [name], et al. Surgery versus conservative management of stable thoracolumbar fracture: the PRESTO feasibility RCT. [place] ([place]): [employer]; 2021 Nov. [facility] Technology Assessment, No. 25.62.) Appendix 3, Oswestry Disability Index category descriptors. Available from: [employer]cz  Minimally Clinically Important Difference (MCID) = 12.8%  COGNITION: Overall cognitive status: Within functional limits for tasks assessed     SENSATION: Occasional pain down the left outer thigh and into the knee but appears to be referred vs radicular  MUSCLE LENGTH: Hamstrings: Right 55 deg; Left 50 deg Thomas test: Right pos; Left pos  POSTURE: Scoliosis obvious, right trunk shift, even more pronounced lying down  PALPATION: Tender left IT band  LUMBAR ROM:   AROM eval  Flexion WNL  Extension 50%  Right lateral flexion WNL  Left lateral flexion Fingertips to just above joint line  Right rotation WNL  Left rotation WNL   (Blank rows = not tested)  LOWER EXTREMITY ROM:     WFL  LOWER EXTREMITY MMT:    Generally 4 to 4+/5 with exception of hip extension 3+ bilaterally, hip abduction 4- right hip, 3+ left hip  LUMBAR SPECIAL  TESTS:  Straight leg raise test: Negative, FABER test: Negative, and Thomas test: Positive  FUNCTIONAL TESTS:  5 times sit to stand: 10.74 sec Timed up and go (TUG): 9.40 sec  GAIT: Distance walked: 30 feet Assistive device utilized: None Level of assistance: Modified independence Comments: fairly normal heel to toe progression  TREATMENT DATE:  04/10/2024 NuStep Level 4 5 mins- PT present to discuss status Seated piriformis stretch 3 x 30 sec each LE Sit to stand x 10 Squat  to mat table x 10 Seated mini sit ups 2 x 10 with 5lb kb Seated modified [place] twist with 5 lbs x 20 (10 each side) Seated shoulder to hip in reclined position with 5lb kb  Standing hamstring stretch 3 x 30 each LE Standing quad stretch 3 x 30 sec Instruction on trunk shift with mirror for visual feedback, instructed patient to do right side trunk stretch Right side trunk stretch on steps in lunge position (patient wanted to do both sides) 5 x 10 sec Supine PPT 2 x 10 Supine PPT + march x 10 bilateral vc's for maintaining tilt Supine PPT + shoulder extension with red tband 2 x 10 Supine PPT + shoulder extension with red tband with knee flexion 2 x 10 Supine IT band hip and glut stretch x 5 holding 10 sec each Seated in reclined position: alternating cross body hand to opposite knee with 2 lb dumbbells Ice x 10 min to lumbar spine at end of session.  04/08/2024 NuStep Level 4 5 mins- PT present to discuss status Standing hamstring stretch 3 x 30 each LE Standing quad stretch 3 x 30 sec Instruction on trunk shift with mirror for visual feedback, instructed patient to do right side trunk stretch Right side trunk stretch on steps in lunge position (patient wanted to do both sides) 5 x 10 sec Supine PPT 2 x 10 Supine PPT + march x 10 bilateral vc's for maintaining tilt Supine PPT + shoulder extension with red tband 2 x 10 Supine PPT + shoulder extension with red tband with knee flexion 2 x 10 Supine IT band  hip and glut stretch x 5 holding 10 sec each Seated in reclined position: alternating cross body hand to opposite knee with 2 lb dumbbells Ice x 10 min to lumbar spine at end of session.  04/02/2024 Seated hamstring stretch 3 x 30 sec bilateral (Nustep not available initially) Instruction on trunk shift with mirror for visual feedback, instructed patient to do right side trunk stretch NuStep Level 4 5 mins- PT present to discuss status Quadriceps stretch with  3 x 30 sec bilateral at 20 inch side of rit fit box with UE support on back of chair (vc's to do PPT to reduce stenosis and pain) Seated piriformis stretch 3 x 30 sec bilateral  Supine PPT 2 x 10 Supine PPT + march x 10 bilateral vc's for maintaining tilt Supine PPT + shoulder extension with yellow tband 2 x 10 Supine PPT + shoulder extension with yellow tband with knee flexion 2 x 10 Ice x 10 min to lumbar spine at end of session.   PATIENT EDUCATION:  Education details: initiated HEP, Educated on proper sleeping posture and lumbar support, Educated on appropriate exercises and those to avoid to reduce stress to the lumbar spine Person educated: Patient Education method: Programmer, multimedia, Demonstration, Verbal cues, and Handouts Education comprehension: verbalized understanding, returned demonstration, verbal cues required, and tactile cues required  HOME EXERCISE PROGRAM: Access Code: [email or web address] URL: [email or web address] Date: 03/26/2024 Prepared by: Kristeen Sar  Exercises - Standing Hamstring Stretch on Chair  - 1 x daily - 7 x weekly - 1 sets - 3 reps - 30 sec hold - Quadricep Stretch with Chair and Counter Support  - 1 x daily - 7 x weekly - 1 sets - 3 reps - 30 sec hold - Seated Figure 4 Piriformis Stretch  - 1 x daily - 7 x weekly - 1 sets - 3 reps - 30 sed hold - Seated  Lateral Trunk Stretch on Swiss Ball  - 1 x daily - 7 x weekly - 1 sets - 10 reps - 5 sec hold - Supine Posterior Pelvic Tilt  - 1 x daily - 7 x  weekly - 2 sets - 10 reps - Bent Knee Fallouts  - 1 x daily - 7 x weekly - 1 sets - 10 reps - Supine March with Posterior Pelvic Tilt  - 1 x daily - 7 x weekly - 1 sets - 10 reps  ASSESSMENT:  CLINICAL IMPRESSION: Indie responded well to dose pack and is doing well.  She was able to do all exercises today with no increase in pain.  She was able to tolerate addition of seated core strengthening.  She is well motivated and compliant.  She should continue to do well.   She would benefit from continuing skilled PT for flexibility and core strength.     OBJECTIVE IMPAIRMENTS: difficulty walking, decreased ROM, decreased strength, increased fascial restrictions, increased muscle spasms, impaired flexibility, improper body mechanics, postural dysfunction, and pain.   ACTIVITY LIMITATIONS: carrying, lifting, bending, standing, squatting, sleeping, stairs, transfers, bed mobility, bathing, toileting, dressing, and caring for others  PARTICIPATION LIMITATIONS: meal prep, cleaning, laundry, driving, shopping, community activity, and yard work  PERSONAL FACTORS: Fitness and 1-2 comorbidities: Parkinsons and Gilberts syndrome are also affecting patient's functional outcome.   REHAB POTENTIAL: Good  CLINICAL DECISION MAKING: Evolving/moderate complexity  EVALUATION COMPLEXITY: Moderate   GOALS: Goals reviewed with patient? Yes  SHORT TERM GOALS: Target date: 04/20/2024  Pain report to be no greater than 4/10  Baseline: Goal status: INITIAL  2.  Patient will be independent with initial HEP  Baseline:  Goal status: INITIAL  3.  Patient to report 30% or better improvement in overall function Baseline:  Goal status: INITIAL   LONG TERM GOALS: Target date: 05/18/2024  Patient to report pain no greater than 2/10  Baseline:  Goal status: INITIAL  2.  Patient to be independent with advanced HEP  Baseline:  Goal status: INITIAL  3.  Patients Oswestry score to improve by 3-5  points Baseline:  Goal status: INITIAL  4.  Functional scores to improve by 2-3 seconds Baseline:  Goal status: INITIAL  5.  Patient to be able to sleep through the night  Baseline:  Goal status: INITIAL  6.  Patient to report 85% improvement in overall symptoms Baseline:  Goal status: INITIAL  PLAN:  PT FREQUENCY: 1-2x/week  PT DURATION: 8 weeks  PLANNED INTERVENTIONS: 97110-Therapeutic exercises, 97530- Therapeutic activity, [id number]- Neuromuscular re-education, 97535- Self Care, [id number]- Manual therapy, [phone]- Gait training, [phone]- Canalith repositioning, [id number]- Aquatic Therapy, [phone]- Electrical stimulation (unattended), [id number]- Electrical stimulation (manual), [id number]- Ultrasound, [id number]- Traction (mechanical), [id number]- Ionotophoresis 4mg /ml Dexamethasone , Patient/Family education, Balance training, Stair training, Taping, Joint mobilization, Spinal mobilization, Vestibular training, DME instructions, Cryotherapy, and Moist heat.  PLAN FOR NEXT SESSION: continue LE flexibility, core strengthening, correcting trunk shift,  continue TA activation; hinging technique   Trini Christiansen B. Cari Burgo, PT 04/12/24 10:22 PM  [facility] [address] [place], [place] [address] Phone # [phone] Fax [phone]

## 2024-04-11 ENCOUNTER — Encounter: Payer: Self-pay | Admitting: Hematology & Oncology

## 2024-04-15 ENCOUNTER — Ambulatory Visit

## 2024-04-15 DIAGNOSIS — R293 Abnormal posture: Secondary | ICD-10-CM

## 2024-04-15 DIAGNOSIS — M5459 Other low back pain: Secondary | ICD-10-CM

## 2024-04-15 DIAGNOSIS — R252 Cramp and spasm: Secondary | ICD-10-CM | POA: Diagnosis not present

## 2024-04-15 DIAGNOSIS — R262 Difficulty in walking, not elsewhere classified: Secondary | ICD-10-CM | POA: Diagnosis not present

## 2024-04-15 DIAGNOSIS — M6281 Muscle weakness (generalized): Secondary | ICD-10-CM | POA: Diagnosis not present

## 2024-04-15 NOTE — Therapy (Signed)
 OUTPATIENT PHYSICAL THERAPY THORACOLUMBAR TREATMENT   Patient Name: Dominique Griffin MRN: [id number] DOB:November 05, 1950, 73 y.o., female Today's Date: 04/15/2024  END OF SESSION:  PT End of Session - 04/15/24 1406     Visit Number 6    Number of Visits 16    Date for Recertification  05/18/24    Authorization Type [employer] Approved 16 visits-03/23/24-06/21/2024-auth#[id number]    Authorization Time Period 03/23/24-06/21/2024    Authorization - Visit Number 6    Authorization - Number of Visits 16    Progress Note Due on Visit 10    PT Start Time 1403    PT Stop Time 1445    PT Time Calculation (min) 42 min    Activity Tolerance Patient tolerated treatment well    Behavior During Therapy WFL for tasks assessed/performed            Past Medical History:  Diagnosis Date   Gilbert's syndrome    History of ITP    Osteopenia    Parkinson disease (HCC)    Past Surgical History:  Procedure Laterality Date   ABDOMINAL HYSTERECTOMY  1993   fibroids, ovaries remain   BREAST EXCISIONAL BIOPSY Left    skin bbiopsy   TONSILLECTOMY     Patient Active Problem List   Diagnosis Date Noted   Lumbar back pain with radiculopathy affecting left lower extremity 03/10/2024   Class 1 obesity due to excess calories without serious comorbidity with body mass index (BMI) of 30.0 to 30.9 in adult 12/15/2023   Gallstones 02/25/2023   B12 deficiency 01/07/2023   Dizziness - PPPD 10/24/2022   Chronic ITP (idiopathic thrombocytopenia) (HCC) 09/07/2022   Clinical trial exam 06/27/2021   Osteopenia 11/13/2020   Vitamin D deficiency 11/13/2020   Diverticulosis of colon 11/13/2020   Family history of colon cancer 11/13/2020   Arthritis 09/27/2020   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   Coccyx pain 09/27/2020   Parkinson's disease - Dr Evonnie 12/02/2019    PCP: Geofm Glade PARAS, MD  REFERRING PROVIDER: Geofm Glade PARAS, MD  REFERRING DIAG: M54.9 (ICD-10-CM) - Back pain, unspecified back location, unspecified  back pain laterality, unspecified chronicity  Rationale for Evaluation and Treatment: Rehabilitation  THERAPY DIAG:  Other low back pain  Difficulty in walking, not elsewhere classified  Cramp and spasm  Muscle weakness (generalized)  Abnormal posture  ONSET DATE: 03/19/2024  SUBJECTIVE:                                                                                                                                                                                           SUBJECTIVE STATEMENT: Patient reports she is still  doing well.  There is one area still on the left that seems to stay      From Eval: Back pain since she was in her 40's. In her 50's fell from a ladder.  In her 2's, a lot of OA found.  In her 66's, diagnosed with Parkinsons.  Now very active doing [employer], multiple Parkinsons classes, Tai Chi, multiple stretching classes to try and deal with the parkinsons symptoms.  She was doing happy baby and felt pain in the low back flare up.  She was prescribed dose pack and other meds.  She hopes to eliminate the back pain so that she can continue working on controlling her Parkinson's symptoms.    PERTINENT HISTORY:  Parkinson's    PAIN:  04/15/24 Are you having pain? Yes: NPRS scale: 2-4/10 Pain location: Left lower back, left lateral LE, occasional left lateral knee Pain description: aching Aggravating factors: standing Relieving factors: exercise, meds  PRECAUTIONS: None  RED FLAGS: None   WEIGHT BEARING RESTRICTIONS: No  FALLS:  Has patient fallen in last 6 months? No  LIVING ENVIRONMENT: Lives with: lives with their family Lives in: House/apartment Stairs: Yes: Internal: 17 steps; on right going up and External: 5 steps; on right going up Has following equipment at home: None  OCCUPATION: retired   PLOF: Independent, Independent with basic ADLs, Independent with household mobility without device, Independent with community mobility without device,  Independent with homemaking with ambulation, Independent with gait, and Independent with transfers  PATIENT GOALS: To be able to continue to exercise to reduce her Parkinson's symptoms  NEXT MD VISIT: prn  OBJECTIVE:  Note: Objective measures were completed at Evaluation unless otherwise noted.  DIAGNOSTIC FINDINGS:  na  PATIENT SURVEYS:  Modified Oswestry:  MODIFIED OSWESTRY DISABILITY SCALE  Date: 03/23/24 Score  Total 14/50   Interpretation of scores: Score Category Description  0-20% Minimal Disability The patient can cope with most living activities. Usually no treatment is indicated apart from advice on lifting, sitting and exercise  21-40% Moderate Disability The patient experiences more pain and difficulty with sitting, lifting and standing. Travel and social life are more difficult and they may be disabled from work. Personal care, sexual activity and sleeping are not grossly affected, and the patient can usually be managed by conservative means  41-60% Severe Disability Pain remains the main problem in this group, but activities of daily living are affected. These patients require a detailed investigation  61-80% Crippled Back pain impinges on all aspects of the patient's life. Positive intervention is required  81-100% Bed-bound  These patients are either bed-bound or exaggerating their symptoms  [name], et al. Surgery versus conservative management of stable thoracolumbar fracture: the PRESTO feasibility RCT. [place] ([place]): [employer]; 2021 Nov. [facility] Technology Assessment, No. 25.62.) Appendix 3, Oswestry Disability Index category descriptors. Available from: FindJewel[employer].cz  Minimally Clinically Important Difference (MCID) = 12.8%  COGNITION: Overall cognitive status: Within functional limits for tasks assessed     SENSATION: Occasional pain down the left outer thigh and into the knee but appears to be  referred vs radicular  MUSCLE LENGTH: Hamstrings: Right 55 deg; Left 50 deg Thomas test: Right pos; Left pos  POSTURE: Scoliosis obvious, right trunk shift, even more pronounced lying down  PALPATION: Tender left IT band  LUMBAR ROM:   AROM eval  Flexion WNL  Extension 50%  Right lateral flexion WNL  Left lateral flexion Fingertips to just above joint line  Right rotation WNL  Left rotation WNL   (Blank rows = not tested)  LOWER EXTREMITY ROM:     WFL  LOWER EXTREMITY MMT:    Generally 4 to 4+/5 with exception of hip extension 3+ bilaterally, hip abduction 4- right hip, 3+ left hip  LUMBAR SPECIAL TESTS:  Straight leg raise test: Negative, FABER test: Negative, and Thomas test: Positive  FUNCTIONAL TESTS:  5 times sit to stand: 10.74 sec Timed up and go (TUG): 9.40 sec  GAIT: Distance walked: 30 feet Assistive device utilized: None Level of assistance: Modified independence Comments: fairly normal heel to toe progression  TREATMENT DATE:  04/15/2024 Seated piriformis stretch 3 x 30 sec each LE Standing hamstring stretch 3 x 30 each LE Standing quad stretch 3 x 30 sec Standing in front of mirror visual,verbal and tactile cues to correct shift Standing lateral trunk stretch x 5 each side Standing lunge with lateral trunk stretch x 5 each LE but same right arm reach to left  On edge of step in // bars: hip hiking x 10 each LE Seated mini sit ups 2 x 10 with 5lb kb Seated modified Russian twist with 5 lbs x 20 (10 each side) Seated shoulder to hip in reclined position with 5lb kb  Supine PPT 2 x 10 Supine PPT + march x 20 bilateral vc's for maintaining tilt Supine PPT with hand to opposite knee 2 x 10 each side with 3 lb dumbbells Supine PPT with isometric press with hands on physio ball rested on knees x 20 Sit to stand x 10 added 3 lb fwd press  Squat to mat table x 10 added 3 lb fwd press Declined ice/opted for addl exercises  04/10/2024 NuStep Level 4 5  mins- PT present to discuss status Seated piriformis stretch 3 x 30 sec each LE Sit to stand x 10 Squat to mat table x 10 Seated mini sit ups 2 x 10 with 5lb kb Seated modified [place] twist with 5 lbs x 20 (10 each side) Seated shoulder to hip in reclined position with 5lb kb  Standing hamstring stretch 3 x 30 each LE Standing quad stretch 3 x 30 sec Instruction on trunk shift with mirror for visual feedback, instructed patient to do right side trunk stretch Right side trunk stretch on steps in lunge position (patient wanted to do both sides) 5 x 10 sec Supine PPT 2 x 10 Supine PPT + march x 10 bilateral vc's for maintaining tilt Supine PPT + shoulder extension with red tband 2 x 10 Supine PPT + shoulder extension with red tband with knee flexion 2 x 10 Supine IT band hip and glut stretch x 5 holding 10 sec each Seated in reclined position: alternating cross body hand to opposite knee with 2 lb dumbbells Ice x 10 min to lumbar spine at end of session.  04/08/2024 NuStep Level 4 5 mins- PT present to discuss status Standing hamstring stretch 3 x 30 each LE Standing quad stretch 3 x 30 sec Instruction on trunk shift with mirror for visual feedback, instructed patient to do right side trunk stretch Right side trunk stretch on steps in lunge position (patient wanted to do both sides) 5 x 10 sec Supine PPT 2 x 10 Supine PPT + march x 10 bilateral vc's for maintaining tilt Supine PPT + shoulder extension with red tband 2 x 10 Supine PPT + shoulder extension with red tband with knee flexion 2 x 10 Supine IT band hip and glut  stretch x 5 holding 10 sec each Seated in reclined position: alternating cross body hand to opposite knee with 2 lb dumbbells Ice x 10 min to lumbar spine at end of session.  04/02/2024 Seated hamstring stretch 3 x 30 sec bilateral (Nustep not available initially) Instruction on trunk shift with mirror for visual feedback, instructed patient to do right side trunk  stretch NuStep Level 4 5 mins- PT present to discuss status Quadriceps stretch with  3 x 30 sec bilateral at 20 inch side of rit fit box with UE support on back of chair (vc's to do PPT to reduce stenosis and pain) Seated piriformis stretch 3 x 30 sec bilateral  Supine PPT 2 x 10 Supine PPT + march x 10 bilateral vc's for maintaining tilt Supine PPT + shoulder extension with yellow tband 2 x 10 Supine PPT + shoulder extension with yellow tband with knee flexion 2 x 10 Ice x 10 min to lumbar spine at end of session.   PATIENT EDUCATION:  Education details: initiated HEP, Educated on proper sleeping posture and lumbar support, Educated on appropriate exercises and those to avoid to reduce stress to the lumbar spine Person educated: Patient Education method: Programmer, multimedia, Demonstration, Verbal cues, and Handouts Education comprehension: verbalized understanding, returned demonstration, verbal cues required, and tactile cues required  HOME EXERCISE PROGRAM: Access Code: [email or web address] URL: [email or web address] Date: 03/26/2024 Prepared by: Kristeen Sar  Exercises - Standing Hamstring Stretch on Chair  - 1 x daily - 7 x weekly - 1 sets - 3 reps - 30 sec hold - Theatre manager with Chair and Counter Support  - 1 x daily - 7 x weekly - 1 sets - 3 reps - 30 sec hold - Seated Figure 4 Piriformis Stretch  - 1 x daily - 7 x weekly - 1 sets - 3 reps - 30 sed hold - Seated Lateral Trunk Stretch on Swiss Ball  - 1 x daily - 7 x weekly - 1 sets - 10 reps - 5 sec hold - Supine Posterior Pelvic Tilt  - 1 x daily - 7 x weekly - 2 sets - 10 reps - Bent Knee Fallouts  - 1 x daily - 7 x weekly - 1 sets - 10 reps - Supine March with Posterior Pelvic Tilt  - 1 x daily - 7 x weekly - 1 sets - 10 reps  ASSESSMENT:  CLINICAL IMPRESSION: Lashawne is progressing appropriately.  She continues to have trunk shift but this appears to have improved slightly.  Her pain is well controlled.  She had only one  small area in the left low back that was bothersome.  She tolerated all exercises today with minimal fatigue and no increased pain.  She is well motivated and compliant.  She hopes to get back to her [employer] boxing class for her Parkinsons soon.     She would benefit from continuing skilled PT for flexibility and core strength.     OBJECTIVE IMPAIRMENTS: difficulty walking, decreased ROM, decreased strength, increased fascial restrictions, increased muscle spasms, impaired flexibility, improper body mechanics, postural dysfunction, and pain.   ACTIVITY LIMITATIONS: carrying, lifting, bending, standing, squatting, sleeping, stairs, transfers, bed mobility, bathing, toileting, dressing, and caring for others  PARTICIPATION LIMITATIONS: meal prep, cleaning, laundry, driving, shopping, community activity, and yard work  PERSONAL FACTORS: Fitness and 1-2 comorbidities: Parkinsons and Gilberts syndrome are also affecting patient's functional outcome.   REHAB POTENTIAL: Good  CLINICAL DECISION MAKING: Evolving/moderate complexity  EVALUATION COMPLEXITY: Moderate   GOALS:  Goals reviewed with patient? Yes  SHORT TERM GOALS: Target date: 04/20/2024  Pain report to be no greater than 4/10  Baseline: Goal status: MET 04/15/24  2.  Patient will be independent with initial HEP  Baseline:  Goal status: In progress  3.  Patient to report 30% or better improvement in overall function Baseline:  Goal status: In progress   LONG TERM GOALS: Target date: 05/18/2024  Patient to report pain no greater than 2/10  Baseline:  Goal status: INITIAL  2.  Patient to be independent with advanced HEP  Baseline:  Goal status: INITIAL  3.  Patients Oswestry score to improve by 3-5 points Baseline:  Goal status: INITIAL  4.  Functional scores to improve by 2-3 seconds Baseline:  Goal status: INITIAL  5.  Patient to be able to sleep through the night  Baseline:  Goal status: INITIAL  6.   Patient to report 85% improvement in overall symptoms Baseline:  Goal status: INITIAL  PLAN:  PT FREQUENCY: 1-2x/week  PT DURATION: 8 weeks  PLANNED INTERVENTIONS: 97110-Therapeutic exercises, 97530- Therapeutic activity, [id number]- Neuromuscular re-education, 97535- Self Care, [id number]- Manual therapy, [phone]- Gait training, [id number]- Canalith repositioning, [id number]- Aquatic Therapy, [phone]- Electrical stimulation (unattended), [phone]- Electrical stimulation (manual), [id number]- Ultrasound, [id number]- Traction (mechanical), [id number]- Ionotophoresis 4mg /ml Dexamethasone , Patient/Family education, Balance training, Stair training, Taping, Joint mobilization, Spinal mobilization, Vestibular training, DME instructions, Cryotherapy, and Moist heat.  PLAN FOR NEXT SESSION: continue LE flexibility, core strengthening, correcting trunk shift,  continue TA activation; hinging technique   Caden Fatica B. Lorrene Graef, PT 04/15/24 3:18 PM [facility] [address], [address] [place], [place] [address] Phone # [phone] Fax [phone]

## 2024-04-17 ENCOUNTER — Ambulatory Visit

## 2024-04-17 DIAGNOSIS — M6281 Muscle weakness (generalized): Secondary | ICD-10-CM | POA: Diagnosis not present

## 2024-04-17 DIAGNOSIS — R252 Cramp and spasm: Secondary | ICD-10-CM | POA: Diagnosis not present

## 2024-04-17 DIAGNOSIS — R262 Difficulty in walking, not elsewhere classified: Secondary | ICD-10-CM | POA: Diagnosis not present

## 2024-04-17 DIAGNOSIS — R293 Abnormal posture: Secondary | ICD-10-CM | POA: Diagnosis not present

## 2024-04-17 DIAGNOSIS — M5459 Other low back pain: Secondary | ICD-10-CM | POA: Diagnosis not present

## 2024-04-17 NOTE — Therapy (Signed)
 OUTPATIENT PHYSICAL THERAPY THORACOLUMBAR TREATMENT   Patient Name: Dominique Griffin MRN: [id number] DOB:1950-10-15, 73 y.o., female Today's Date: 04/17/2024  END OF SESSION:  PT End of Session - 04/17/24 1020     Visit Number 7    Number of Visits 16    Date for Recertification  05/18/24    Authorization Type [employer] Approved 16 visits-03/23/24-06/21/2024-auth#[id number]    Authorization Time Period 03/23/24-06/21/2024    Authorization - Visit Number 7    Authorization - Number of Visits 16    Progress Note Due on Visit 10    PT Start Time 1015    PT Stop Time 1100    PT Time Calculation (min) 45 min    Activity Tolerance Patient tolerated treatment well    Behavior During Therapy WFL for tasks assessed/performed            Past Medical History:  Diagnosis Date   Gilbert's syndrome    History of ITP    Osteopenia    Parkinson disease (HCC)    Past Surgical History:  Procedure Laterality Date   ABDOMINAL HYSTERECTOMY  1993   fibroids, ovaries remain   BREAST EXCISIONAL BIOPSY Left    skin bbiopsy   TONSILLECTOMY     Patient Active Problem List   Diagnosis Date Noted   Lumbar back pain with radiculopathy affecting left lower extremity 03/10/2024   Class 1 obesity due to excess calories without serious comorbidity with body mass index (BMI) of 30.0 to 30.9 in adult 12/15/2023   Gallstones 02/25/2023   B12 deficiency 01/07/2023   Dizziness - PPPD 10/24/2022   Chronic ITP (idiopathic thrombocytopenia) (HCC) 09/07/2022   Clinical trial exam 06/27/2021   Osteopenia 11/13/2020   Vitamin D deficiency 11/13/2020   Diverticulosis of colon 11/13/2020   Family history of colon cancer 11/13/2020   Arthritis 09/27/2020   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   Coccyx pain 09/27/2020   Parkinson's disease - Dr Evonnie 12/02/2019    PCP: Geofm Glade PARAS, MD  REFERRING PROVIDER: Geofm Glade PARAS, MD  REFERRING DIAG: M54.9 (ICD-10-CM) - Back pain, unspecified back location, unspecified  back pain laterality, unspecified chronicity  Rationale for Evaluation and Treatment: Rehabilitation  THERAPY DIAG:  Other low back pain  Muscle weakness (generalized)  Cramp and spasm  Difficulty in walking, not elsewhere classified  Abnormal posture  ONSET DATE: 03/19/2024  SUBJECTIVE:                                                                                                                                                                                           SUBJECTIVE STATEMENT: Patient reports she is still  doing well.  This is the first morning I have woken up without a lot of pain.    From Eval: Back pain since she was in her 40's. In her 50's fell from a ladder.  In her 66's, a lot of OA found.  In her 37's, diagnosed with Parkinsons.  Now very active doing [employer], multiple Parkinsons classes, Tai Chi, multiple stretching classes to try and deal with the parkinsons symptoms.  She was doing happy baby and felt pain in the low back flare up.  She was prescribed dose pack and other meds.  She hopes to eliminate the back pain so that she can continue working on controlling her Parkinson's symptoms.    PERTINENT HISTORY:  Parkinson's    PAIN:  04/17/24 Are you having pain? Yes: NPRS scale: 3/10 Pain location: Left lower back, left lateral LE, occasional left lateral knee Pain description: aching Aggravating factors: standing Relieving factors: exercise, meds  PRECAUTIONS: None  RED FLAGS: None   WEIGHT BEARING RESTRICTIONS: No  FALLS:  Has patient fallen in last 6 months? No  LIVING ENVIRONMENT: Lives with: lives with their family Lives in: House/apartment Stairs: Yes: Internal: 17 steps; on right going up and External: 5 steps; on right going up Has following equipment at home: None  OCCUPATION: retired   PLOF: Independent, Independent with basic ADLs, Independent with household mobility without device, Independent with community mobility without  device, Independent with homemaking with ambulation, Independent with gait, and Independent with transfers  PATIENT GOALS: To be able to continue to exercise to reduce her Parkinson's symptoms  NEXT MD VISIT: prn  OBJECTIVE:  Note: Objective measures were completed at Evaluation unless otherwise noted.  DIAGNOSTIC FINDINGS:  na  PATIENT SURVEYS:  Modified Oswestry:  MODIFIED OSWESTRY DISABILITY SCALE  Date: 03/23/24 Score  Total 14/50   Interpretation of scores: Score Category Description  0-20% Minimal Disability The patient can cope with most living activities. Usually no treatment is indicated apart from advice on lifting, sitting and exercise  21-40% Moderate Disability The patient experiences more pain and difficulty with sitting, lifting and standing. Travel and social life are more difficult and they may be disabled from work. Personal care, sexual activity and sleeping are not grossly affected, and the patient can usually be managed by conservative means  41-60% Severe Disability Pain remains the main problem in this group, but activities of daily living are affected. These patients require a detailed investigation  61-80% Crippled Back pain impinges on all aspects of the patient's life. Positive intervention is required  81-100% Bed-bound  These patients are either bed-bound or exaggerating their symptoms  [name], et al. Surgery versus conservative management of stable thoracolumbar fracture: the PRESTO feasibility RCT. [place] ([place]): [employer]; 2021 Nov. [facility] Technology Assessment, No. 25.62.) Appendix 3, Oswestry Disability Index category descriptors. Available from: FindJewel[employer].cz  Minimally Clinically Important Difference (MCID) = 12.8%  COGNITION: Overall cognitive status: Within functional limits for tasks assessed     SENSATION: Occasional pain down the left outer thigh and into the knee but  appears to be referred vs radicular  MUSCLE LENGTH: Hamstrings: Right 55 deg; Left 50 deg Thomas test: Right pos; Left pos  POSTURE: Scoliosis obvious, right trunk shift, even more pronounced lying down  PALPATION: Tender left IT band  LUMBAR ROM:   AROM eval  Flexion WNL  Extension 50%  Right lateral flexion WNL  Left lateral flexion Fingertips to just above joint line  Right rotation WNL  Left rotation WNL   (Blank rows = not tested)  LOWER EXTREMITY ROM:     WFL  LOWER EXTREMITY MMT:    Generally 4 to 4+/5 with exception of hip extension 3+ bilaterally, hip abduction 4- right hip, 3+ left hip  LUMBAR SPECIAL TESTS:  Straight leg raise test: Negative, FABER test: Negative, and Thomas test: Positive  FUNCTIONAL TESTS:  5 times sit to stand: 10.74 sec Timed up and go (TUG): 9.40 sec  GAIT: Distance walked: 30 feet Assistive device utilized: None Level of assistance: Modified independence Comments: fairly normal heel to toe progression  TREATMENT DATE:  04/17/2024 Nustep x 5 min level 4 Standing hamstring stretch 3 x 30 each LE Standing quad stretch 3 x 30 sec Seated piriformis stretch 3 x 30 sec each LE Supine PPT 2 x 10 Supine PPT + march x 20 bilateral vc's for maintaining tilt Supine PPT with hand to opposite knee 2 x 10 each side with 2 lb dumbbells (had done 3lbs previously but 3's not available) Supine PPT with isometric press with hands on physio ball rested on knees x 20 Seated mini sit ups 2 x 10 with 5lb kb Seated modified Russian twist with 5 lbs x 20 (10 each side) Seated shoulder to hip in reclined position with 5lb kb  Peanut roll stretch x 10 to each side holding 5 sec each On edge of step with balance poles hip hiking x 10 each LE Declined ice  04/15/2024 Seated piriformis stretch 3 x 30 sec each LE Standing hamstring stretch 3 x 30 each LE Standing quad stretch 3 x 30 sec Standing in front of mirror visual,verbal and tactile cues to  correct shift Standing lateral trunk stretch x 5 each side Standing lunge with lateral trunk stretch x 5 each LE but same right arm reach to left  On edge of step in // bars: hip hiking x 10 each LE Seated mini sit ups 2 x 10 with 5lb kb Seated modified Russian twist with 5 lbs x 20 (10 each side) Seated shoulder to hip in reclined position with 5lb kb  Supine PPT 2 x 10 Supine PPT + march x 20 bilateral vc's for maintaining tilt Supine PPT with hand to opposite knee 2 x 10 each side with 3 lb dumbbells Supine PPT with isometric press with hands on physio ball rested on knees x 20 Sit to stand x 10 added 3 lb fwd press  Squat to mat table x 10 added 3 lb fwd press Declined ice/opted for addl exercises  04/10/2024 NuStep Level 4 5 mins- PT present to discuss status Seated piriformis stretch 3 x 30 sec each LE Sit to stand x 10 Squat to mat table x 10 Seated mini sit ups 2 x 10 with 5lb kb Seated modified [place] twist with 5 lbs x 20 (10 each side) Seated shoulder to hip in reclined position with 5lb kb  Standing hamstring stretch 3 x 30 each LE Standing quad stretch 3 x 30 sec Instruction on trunk shift with mirror for visual feedback, instructed patient to do right side trunk stretch Right side trunk stretch on steps in lunge position (patient wanted to do both sides) 5 x 10 sec Supine PPT 2 x 10 Supine PPT + march x 10 bilateral vc's for maintaining tilt Supine PPT + shoulder extension with red tband 2 x 10 Supine PPT + shoulder extension with red tband with knee flexion 2 x 10 Supine IT  band hip and glut stretch x 5 holding 10 sec each Seated in reclined position: alternating cross body hand to opposite knee with 2 lb dumbbells Ice x 10 min to lumbar spine at end of session.  PATIENT EDUCATION:  Education details: initiated HEP, Educated on proper sleeping posture and lumbar support, Educated on appropriate exercises and those to avoid to reduce stress to the lumbar  spine Person educated: Patient Education method: Programmer, multimedia, Demonstration, Verbal cues, and Handouts Education comprehension: verbalized understanding, returned demonstration, verbal cues required, and tactile cues required  HOME EXERCISE PROGRAM: Access Code: [email or web address] URL: [email or web address] Date: 04/17/2024 Prepared by: Delon Haddock  Exercises - Standing Hamstring Stretch on Chair  - 1 x daily - 7 x weekly - 1 sets - 3 reps - 30 sec hold - Quadricep Stretch with Chair and Counter Support  - 1 x daily - 7 x weekly - 1 sets - 3 reps - 30 sec hold - Seated Figure 4 Piriformis Stretch  - 1 x daily - 7 x weekly - 1 sets - 3 reps - 30 sed hold - Seated Lateral Trunk Stretch on Swiss Ball  - 1 x daily - 7 x weekly - 1 sets - 10 reps - 5 sec hold - Supine Posterior Pelvic Tilt  - 1 x daily - 7 x weekly - 2 sets - 10 reps - Bent Knee Fallouts  - 1 x daily - 7 x weekly - 1 sets - 10 reps - Supine March with Posterior Pelvic Tilt  - 1 x daily - 7 x weekly - 1 sets - 20 reps - Dead Bug  - 1 x daily - 7 x weekly - 1 sets - 20 reps - Clamshell  - 1 x daily - 7 x weekly - 2 sets - 10 reps - Abdominal Press into [place]  - 1 x daily - 7 x weekly - 2 sets - 10 reps ASSESSMENT:  CLINICAL IMPRESSION: Dominique Griffin is beginning to see her pain level decrease in the morning.  She is able to do all core work without increased pain.  Trunk shift is still present.  She is diligent with her HEP.    She would benefit from continuing skilled PT for flexibility and core strength.     OBJECTIVE IMPAIRMENTS: difficulty walking, decreased ROM, decreased strength, increased fascial restrictions, increased muscle spasms, impaired flexibility, improper body mechanics, postural dysfunction, and pain.   ACTIVITY LIMITATIONS: carrying, lifting, bending, standing, squatting, sleeping, stairs, transfers, bed mobility, bathing, toileting, dressing, and caring for others  PARTICIPATION LIMITATIONS: meal prep,  cleaning, laundry, driving, shopping, community activity, and yard work  PERSONAL FACTORS: Fitness and 1-2 comorbidities: Parkinsons and Gilberts syndrome are also affecting patient's functional outcome.   REHAB POTENTIAL: Good  CLINICAL DECISION MAKING: Evolving/moderate complexity  EVALUATION COMPLEXITY: Moderate   GOALS: Goals reviewed with patient? Yes  SHORT TERM GOALS: Target date: 04/20/2024  Pain report to be no greater than 4/10  Baseline: Goal status: MET 04/15/24  2.  Patient will be independent with initial HEP  Baseline:  Goal status: MET 04/17/24  3.  Patient to report 30% or better improvement in overall function Baseline:  Goal status: In progress   LONG TERM GOALS: Target date: 05/18/2024  Patient to report pain no greater than 2/10  Baseline:  Goal status: INITIAL  2.  Patient to be independent with advanced HEP  Baseline:  Goal status: INITIAL  3.  Patients Oswestry score to improve by 3-5 points Baseline:  Goal  status: INITIAL  4.  Functional scores to improve by 2-3 seconds Baseline:  Goal status: INITIAL  5.  Patient to be able to sleep through the night  Baseline:  Goal status: INITIAL  6.  Patient to report 85% improvement in overall symptoms Baseline:  Goal status: INITIAL  PLAN:  PT FREQUENCY: 1-2x/week  PT DURATION: 8 weeks  PLANNED INTERVENTIONS: 97110-Therapeutic exercises, 97530- Therapeutic activity, [id number]- Neuromuscular re-education, 97535- Self Care, [id number]- Manual therapy, [phone]- Gait training, [phone]- Canalith repositioning, [id number]- Aquatic Therapy, [phone]- Electrical stimulation (unattended), [phone]- Electrical stimulation (manual), [id number]- Ultrasound, [id number]- Traction (mechanical), [id number]- Ionotophoresis 4mg /ml Dexamethasone , Patient/Family education, Balance training, Stair training, Taping, Joint mobilization, Spinal mobilization, Vestibular training, DME instructions, Cryotherapy, and Moist heat.  PLAN FOR NEXT SESSION: Hip  hiking and stability, continue LE flexibility, core strengthening, correcting trunk shift,  continue TA activation; hinging technique   Lemoine Goyne B. Chaquetta Schlottman, PT 04/17/24 11:10 AM [facility] [address], [address] [place], [place] [address] Phone # [phone] Fax [phone]

## 2024-04-20 ENCOUNTER — Other Ambulatory Visit: Payer: Self-pay | Admitting: Internal Medicine

## 2024-04-20 DIAGNOSIS — Z1231 Encounter for screening mammogram for malignant neoplasm of breast: Secondary | ICD-10-CM

## 2024-04-22 ENCOUNTER — Ambulatory Visit

## 2024-04-22 DIAGNOSIS — M6281 Muscle weakness (generalized): Secondary | ICD-10-CM

## 2024-04-22 DIAGNOSIS — R262 Difficulty in walking, not elsewhere classified: Secondary | ICD-10-CM | POA: Diagnosis not present

## 2024-04-22 DIAGNOSIS — R293 Abnormal posture: Secondary | ICD-10-CM | POA: Diagnosis not present

## 2024-04-22 DIAGNOSIS — M5459 Other low back pain: Secondary | ICD-10-CM

## 2024-04-22 DIAGNOSIS — R252 Cramp and spasm: Secondary | ICD-10-CM | POA: Diagnosis not present

## 2024-04-22 NOTE — Therapy (Signed)
 OUTPATIENT PHYSICAL THERAPY THORACOLUMBAR TREATMENT   Patient Name: Dominique Griffin MRN: [id number] DOB:01-31-1951, 73 y.o., female Today's Date: 04/22/2024  END OF SESSION:  PT End of Session - 04/22/24 1204     Visit Number 8    Number of Visits 16    Date for Recertification  05/18/24    Authorization Type [employer] Approved 16 visits-03/23/24-06/21/2024-auth#[id number]    Authorization Time Period 03/23/24-06/21/2024    Authorization - Visit Number 8    Authorization - Number of Visits 16    Progress Note Due on Visit 10    PT Start Time 1148    PT Stop Time 1230    PT Time Calculation (min) 42 min    Activity Tolerance Patient tolerated treatment well    Behavior During Therapy WFL for tasks assessed/performed            Past Medical History:  Diagnosis Date   Gilbert's syndrome    History of ITP    Osteopenia    Parkinson disease (HCC)    Past Surgical History:  Procedure Laterality Date   ABDOMINAL HYSTERECTOMY  1993   fibroids, ovaries remain   BREAST EXCISIONAL BIOPSY Left    skin bbiopsy   TONSILLECTOMY     Patient Active Problem List   Diagnosis Date Noted   Lumbar back pain with radiculopathy affecting left lower extremity 03/10/2024   Class 1 obesity due to excess calories without serious comorbidity with body mass index (BMI) of 30.0 to 30.9 in adult 12/15/2023   Gallstones 02/25/2023   B12 deficiency 01/07/2023   Dizziness - PPPD 10/24/2022   Chronic ITP (idiopathic thrombocytopenia) (HCC) 09/07/2022   Clinical trial exam 06/27/2021   Osteopenia 11/13/2020   Vitamin D deficiency 11/13/2020   Diverticulosis of colon 11/13/2020   Family history of colon cancer 11/13/2020   Arthritis 09/27/2020   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   Coccyx pain 09/27/2020   Parkinson's disease - Dr Evonnie 12/02/2019    PCP: Geofm Glade PARAS, MD  REFERRING PROVIDER: Geofm Glade PARAS, MD  REFERRING DIAG: M54.9 (ICD-10-CM) - Back pain, unspecified back location, unspecified  back pain laterality, unspecified chronicity  Rationale for Evaluation and Treatment: Rehabilitation  THERAPY DIAG:  Other low back pain  Muscle weakness (generalized)  Cramp and spasm  Abnormal posture  Difficulty in walking, not elsewhere classified  ONSET DATE: 03/19/2024  SUBJECTIVE:                                                                                                                                                                                           SUBJECTIVE STATEMENT: Patient reports start up in  the morning continues to get easier.   She arrives with more symmetrical sit to stand and gait with decreased trendelenburg.     From Eval: Back pain since she was in her 40's. In her 50's fell from a ladder.  In her 58's, a lot of OA found.  In her 50's, diagnosed with Parkinsons.  Now very active doing [employer], multiple Parkinsons classes, Tai Chi, multiple stretching classes to try and deal with the parkinsons symptoms.  She was doing happy baby and felt pain in the low back flare up.  She was prescribed dose pack and other meds.  She hopes to eliminate the back pain so that she can continue working on controlling her Parkinson's symptoms.    PERTINENT HISTORY:  Parkinson's    PAIN:  04/17/24 Are you having pain? Yes: NPRS scale: 3/10 Pain location: Left lower back, left lateral LE, occasional left lateral knee Pain description: aching Aggravating factors: standing Relieving factors: exercise, meds  PRECAUTIONS: None  RED FLAGS: None   WEIGHT BEARING RESTRICTIONS: No  FALLS:  Has patient fallen in last 6 months? No  LIVING ENVIRONMENT: Lives with: lives with their family Lives in: House/apartment Stairs: Yes: Internal: 17 steps; on right going up and External: 5 steps; on right going up Has following equipment at home: None  OCCUPATION: retired   PLOF: Independent, Independent with basic ADLs, Independent with household mobility without  device, Independent with community mobility without device, Independent with homemaking with ambulation, Independent with gait, and Independent with transfers  PATIENT GOALS: To be able to continue to exercise to reduce her Parkinson's symptoms  NEXT MD VISIT: prn  OBJECTIVE:  Note: Objective measures were completed at Evaluation unless otherwise noted.  DIAGNOSTIC FINDINGS:  na  PATIENT SURVEYS:  Modified Oswestry:  MODIFIED OSWESTRY DISABILITY SCALE  Date: 03/23/24 Score  Total 14/50   Interpretation of scores: Score Category Description  0-20% Minimal Disability The patient can cope with most living activities. Usually no treatment is indicated apart from advice on lifting, sitting and exercise  21-40% Moderate Disability The patient experiences more pain and difficulty with sitting, lifting and standing. Travel and social life are more difficult and they may be disabled from work. Personal care, sexual activity and sleeping are not grossly affected, and the patient can usually be managed by conservative means  41-60% Severe Disability Pain remains the main problem in this group, but activities of daily living are affected. These patients require a detailed investigation  61-80% Crippled Back pain impinges on all aspects of the patient's life. Positive intervention is required  81-100% Bed-bound  These patients are either bed-bound or exaggerating their symptoms  [name], et al. Surgery versus conservative management of stable thoracolumbar fracture: the PRESTO feasibility RCT. [place] ([place]): [employer]; 2021 Nov. [facility] Technology Assessment, No. 25.62.) Appendix 3, Oswestry Disability Index category descriptors. Available from: FindJewel[employer].cz  Minimally Clinically Important Difference (MCID) = 12.8%  COGNITION: Overall cognitive status: Within functional limits for tasks assessed     SENSATION: Occasional pain  down the left outer thigh and into the knee but appears to be referred vs radicular  MUSCLE LENGTH: Hamstrings: Right 55 deg; Left 50 deg Thomas test: Right pos; Left pos  POSTURE: Scoliosis obvious, right trunk shift, even more pronounced lying down  PALPATION: Tender left IT band  LUMBAR ROM:   AROM eval  Flexion WNL  Extension 50%  Right lateral flexion WNL  Left lateral flexion Fingertips to  just above joint line  Right rotation WNL  Left rotation WNL   (Blank rows = not tested)  LOWER EXTREMITY ROM:     WFL  LOWER EXTREMITY MMT:    Generally 4 to 4+/5 with exception of hip extension 3+ bilaterally, hip abduction 4- right hip, 3+ left hip  LUMBAR SPECIAL TESTS:  Straight leg raise test: Negative, FABER test: Negative, and Thomas test: Positive  FUNCTIONAL TESTS:  5 times sit to stand: 10.74 sec Timed up and go (TUG): 9.40 sec  GAIT: Distance walked: 30 feet Assistive device utilized: None Level of assistance: Modified independence Comments: fairly normal heel to toe progression  TREATMENT DATE:  04/17/2024 Nustep x 5 min level 4 Seated mini sit ups 2 x 10 with 5lb kb Seated modified [place] twist with 5 lbs x 20 (10 each side) Seated shoulder to hip in reclined position with 5lb kb  Peanut roll stretch x 10 to each side holding 5 sec each Seated ball roll outs with blue physio ball x 10 fwd and x 10 to left  with 2-3 second pause Supine PPT 2 x 10 Supine PPT + march x 20 bilateral vc's for maintaining tilt (did unsupported today) Supine PPT with dying bug unsupported x 20 Supine PPT with hand to opposite knee 2 x 10 each side with 3lbs (unsupported) Supine PPT with clamshell using black loop x 20 Supine PPT  bridge and clamshell with black loop combo 2 x 10 Seated opposite arm to opposite knee with 3 lbs x 20 Standing opposite arm to opposite knee with 3 lbs x 20   04/17/2024 Nustep x 5 min level 4 Standing hamstring stretch 3 x 30 each LE Standing  quad stretch 3 x 30 sec Seated piriformis stretch 3 x 30 sec each LE Supine PPT 2 x 10 Supine PPT + march x 20 bilateral vc's for maintaining tilt Supine PPT with hand to opposite knee 2 x 10 each side with 2 lb dumbbells (had done 3lbs previously but 3's not available) Supine PPT with isometric press with hands on physio ball rested on knees x 20 Seated mini sit ups 2 x 10 with 5lb kb Seated modified Russian twist with 5 lbs x 20 (10 each side) Seated shoulder to hip in reclined position with 5lb kb  Peanut roll stretch x 10 to each side holding 5 sec each On edge of step with balance poles hip hiking x 10 each LE Declined ice  04/15/2024 Seated piriformis stretch 3 x 30 sec each LE Standing hamstring stretch 3 x 30 each LE Standing quad stretch 3 x 30 sec Standing in front of mirror visual,verbal and tactile cues to correct shift Standing lateral trunk stretch x 5 each side Standing lunge with lateral trunk stretch x 5 each LE but same right arm reach to left  On edge of step in // bars: hip hiking x 10 each LE Seated mini sit ups 2 x 10 with 5lb kb Seated modified Russian twist with 5 lbs x 20 (10 each side) Seated shoulder to hip in reclined position with 5lb kb  Supine PPT 2 x 10 Supine PPT + march x 20 bilateral vc's for maintaining tilt Supine PPT with hand to opposite knee 2 x 10 each side with 3 lb dumbbells Supine PPT with isometric press with hands on physio ball rested on knees x 20 Sit to stand x 10 added 3 lb fwd press  Squat to mat table x 10 added 3 lb  fwd press Declined ice/opted for addl exercises   PATIENT EDUCATION:  Education details: initiated HEP, Educated on proper sleeping posture and lumbar support, Educated on appropriate exercises and those to avoid to reduce stress to the lumbar spine Person educated: Patient Education method: Programmer, multimedia, Demonstration, Verbal cues, and Handouts Education comprehension: verbalized understanding, returned  demonstration, verbal cues required, and tactile cues required  HOME EXERCISE PROGRAM: Access Code: [email or web address] URL: [email or web address] Date: 04/17/2024 Prepared by: Delon Haddock  Exercises - Standing Hamstring Stretch on Chair  - 1 x daily - 7 x weekly - 1 sets - 3 reps - 30 sec hold - Quadricep Stretch with Chair and Counter Support  - 1 x daily - 7 x weekly - 1 sets - 3 reps - 30 sec hold - Seated Figure 4 Piriformis Stretch  - 1 x daily - 7 x weekly - 1 sets - 3 reps - 30 sed hold - Seated Lateral Trunk Stretch on Swiss Ball  - 1 x daily - 7 x weekly - 1 sets - 10 reps - 5 sec hold - Supine Posterior Pelvic Tilt  - 1 x daily - 7 x weekly - 2 sets - 10 reps - Bent Knee Fallouts  - 1 x daily - 7 x weekly - 1 sets - 10 reps - Supine March with Posterior Pelvic Tilt  - 1 x daily - 7 x weekly - 1 sets - 20 reps - Dead Bug  - 1 x daily - 7 x weekly - 1 sets - 20 reps - Clamshell  - 1 x daily - 7 x weekly - 2 sets - 10 reps - Abdominal Press into [place]  - 1 x daily - 7 x weekly - 2 sets - 10 reps ASSESSMENT:  CLINICAL IMPRESSION: Dominique Griffin continues to see her pain level decrease in the morning.  We added more unsupported core work today.  She was able to do all unsupported core work without increased pain.  Trunk shift is still present but improving.  She is diligent with her HEP.    She would benefit from continuing skilled PT for flexibility and core strength.     OBJECTIVE IMPAIRMENTS: difficulty walking, decreased ROM, decreased strength, increased fascial restrictions, increased muscle spasms, impaired flexibility, improper body mechanics, postural dysfunction, and pain.   ACTIVITY LIMITATIONS: carrying, lifting, bending, standing, squatting, sleeping, stairs, transfers, bed mobility, bathing, toileting, dressing, and caring for others  PARTICIPATION LIMITATIONS: meal prep, cleaning, laundry, driving, shopping, community activity, and yard work  PERSONAL FACTORS:  Fitness and 1-2 comorbidities: Parkinsons and Gilberts syndrome are also affecting patient's functional outcome.   REHAB POTENTIAL: Good  CLINICAL DECISION MAKING: Evolving/moderate complexity  EVALUATION COMPLEXITY: Moderate   GOALS: Goals reviewed with patient? Yes  SHORT TERM GOALS: Target date: 04/20/2024  Pain report to be no greater than 4/10  Baseline: Goal status: MET 04/15/24  2.  Patient will be independent with initial HEP  Baseline:  Goal status: MET 04/17/24  3.  Patient to report 30% or better improvement in overall function Baseline:  Goal status: In progress   LONG TERM GOALS: Target date: 05/18/2024  Patient to report pain no greater than 2/10  Baseline:  Goal status: In progress  2.  Patient to be independent with advanced HEP  Baseline:  Goal status: In progress  3.  Patients Oswestry score to improve by 3-5 points Baseline:  Goal status: In Progress  4.  Functional scores to improve by 2-3 seconds Baseline:  Goal status:  In Progress  5.  Patient to be able to sleep through the night  Baseline:  Goal status: In Progress  6.  Patient to report 85% improvement in overall symptoms Baseline:  Goal status: In Progress  PLAN:  PT FREQUENCY: 1-2x/week  PT DURATION: 8 weeks  PLANNED INTERVENTIONS: 97110-Therapeutic exercises, 97530- Therapeutic activity, [id number]- Neuromuscular re-education, 97535- Self Care, [id number]- Manual therapy, [phone]- Gait training, [phone]- Canalith repositioning, [id number]- Aquatic Therapy, [phone]- Electrical stimulation (unattended), [id number]- Electrical stimulation (manual), [id number]- Ultrasound, [id number]- Traction (mechanical), [id number]- Ionotophoresis 4mg /ml Dexamethasone , Patient/Family education, Balance training, Stair training, Taping, Joint mobilization, Spinal mobilization, Vestibular training, DME instructions, Cryotherapy, and Moist heat.  PLAN FOR NEXT SESSION: Hip hiking and stability, continue LE flexibility, progress core  strengthening, correcting trunk shift,  continue TA activation; hinging technique   Jazlyn Tippens B. Danyla Wattley, PT 04/22/24 12:36 PM [facility] [address] [place], [place] [address] Phone # [phone] Fax [phone]

## 2024-04-24 ENCOUNTER — Ambulatory Visit

## 2024-04-24 DIAGNOSIS — M5459 Other low back pain: Secondary | ICD-10-CM | POA: Diagnosis not present

## 2024-04-24 DIAGNOSIS — R293 Abnormal posture: Secondary | ICD-10-CM | POA: Diagnosis not present

## 2024-04-24 DIAGNOSIS — R252 Cramp and spasm: Secondary | ICD-10-CM

## 2024-04-24 DIAGNOSIS — R262 Difficulty in walking, not elsewhere classified: Secondary | ICD-10-CM

## 2024-04-24 DIAGNOSIS — M6281 Muscle weakness (generalized): Secondary | ICD-10-CM | POA: Diagnosis not present

## 2024-04-24 NOTE — Therapy (Signed)
 OUTPATIENT PHYSICAL THERAPY THORACOLUMBAR TREATMENT   Patient Name: Dominique Griffin MRN: [id number] DOB:June 23, 1951, 73 y.o., female Today's Date: 04/24/2024  END OF SESSION:  PT End of Session - 04/24/24 1011     Visit Number 9    Number of Visits 16    Date for Recertification  05/18/24    Authorization Type [employer] Approved 16 visits-03/23/24-06/21/2024-auth#[id number]    Authorization - Visit Number 9    Authorization - Number of Visits 16    Progress Note Due on Visit 10    PT Start Time 1006    PT Stop Time 1054    PT Time Calculation (min) 48 min    Activity Tolerance Patient tolerated treatment well    Behavior During Therapy WFL for tasks assessed/performed            Past Medical History:  Diagnosis Date   Gilbert's syndrome    History of ITP    Osteopenia    Parkinson disease (HCC)    Past Surgical History:  Procedure Laterality Date   ABDOMINAL HYSTERECTOMY  1993   fibroids, ovaries remain   BREAST EXCISIONAL BIOPSY Left    skin bbiopsy   TONSILLECTOMY     Patient Active Problem List   Diagnosis Date Noted   Lumbar back pain with radiculopathy affecting left lower extremity 03/10/2024   Class 1 obesity due to excess calories without serious comorbidity with body mass index (BMI) of 30.0 to 30.9 in adult 12/15/2023   Gallstones 02/25/2023   B12 deficiency 01/07/2023   Dizziness - PPPD 10/24/2022   Chronic ITP (idiopathic thrombocytopenia) (HCC) 09/07/2022   Clinical trial exam 06/27/2021   Osteopenia 11/13/2020   Vitamin D deficiency 11/13/2020   Diverticulosis of colon 11/13/2020   Family history of colon cancer 11/13/2020   Arthritis 09/27/2020   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   Coccyx pain 09/27/2020   Parkinson's disease - Dr Evonnie 12/02/2019    PCP: Geofm Glade PARAS, MD  REFERRING PROVIDER: Geofm Glade PARAS, MD  REFERRING DIAG: M54.9 (ICD-10-CM) - Back pain, unspecified back location, unspecified back pain laterality, unspecified  chronicity  Rationale for Evaluation and Treatment: Rehabilitation  THERAPY DIAG:  Other low back pain  Abnormal posture  Muscle weakness (generalized)  Difficulty in walking, not elsewhere classified  Cramp and spasm  ONSET DATE: 03/19/2024  SUBJECTIVE:                                                                                                                                                                                           SUBJECTIVE STATEMENT: Patient reports doing well.  I get a little pain, no more  than a 3 and it's manageable.     From Eval: Back pain since she was in her 40's. In her 50's fell from a ladder.  In her 26's, a lot of OA found.  In her 73's, diagnosed with Parkinsons.  Now very active doing [employer], multiple Parkinsons classes, Tai Chi, multiple stretching classes to try and deal with the parkinsons symptoms.  She was doing happy baby and felt pain in the low back flare up.  She was prescribed dose pack and other meds.  She hopes to eliminate the back pain so that she can continue working on controlling her Parkinson's symptoms.    PERTINENT HISTORY:  Parkinson's    PAIN:  04/24/24 Are you having pain? Yes: NPRS scale: 2/10 Pain location: Left lower back, left lateral LE, occasional left lateral knee Pain description: aching Aggravating factors: standing Relieving factors: exercise, meds  PRECAUTIONS: None  RED FLAGS: None   WEIGHT BEARING RESTRICTIONS: No  FALLS:  Has patient fallen in last 6 months? No  LIVING ENVIRONMENT: Lives with: lives with their family Lives in: House/apartment Stairs: Yes: Internal: 17 steps; on right going up and External: 5 steps; on right going up Has following equipment at home: None  OCCUPATION: retired   PLOF: Independent, Independent with basic ADLs, Independent with household mobility without device, Independent with community mobility without device, Independent with homemaking with ambulation,  Independent with gait, and Independent with transfers  PATIENT GOALS: To be able to continue to exercise to reduce her Parkinson's symptoms  NEXT MD VISIT: prn  OBJECTIVE:  Note: Objective measures were completed at Evaluation unless otherwise noted.  DIAGNOSTIC FINDINGS:  na  PATIENT SURVEYS:  Modified Oswestry:  MODIFIED OSWESTRY DISABILITY SCALE  Date: 03/23/24 Score  Total 14/50   Interpretation of scores: Score Category Description  0-20% Minimal Disability The patient can cope with most living activities. Usually no treatment is indicated apart from advice on lifting, sitting and exercise  21-40% Moderate Disability The patient experiences more pain and difficulty with sitting, lifting and standing. Travel and social life are more difficult and they may be disabled from work. Personal care, sexual activity and sleeping are not grossly affected, and the patient can usually be managed by conservative means  41-60% Severe Disability Pain remains the main problem in this group, but activities of daily living are affected. These patients require a detailed investigation  61-80% Crippled Back pain impinges on all aspects of the patient's life. Positive intervention is required  81-100% Bed-bound  These patients are either bed-bound or exaggerating their symptoms  [name], et al. Surgery versus conservative management of stable thoracolumbar fracture: the PRESTO feasibility RCT. [place]): [employer]; 2021 Nov. [facility] Technology Assessment, No. 25.62.) Appendix 3, Oswestry Disability Index category descriptors. Available from: FindJewel[employer].cz  Minimally Clinically Important Difference (MCID) = 12.8%  COGNITION: Overall cognitive status: Within functional limits for tasks assessed     SENSATION: Occasional pain down the left outer thigh and into the knee but appears to be referred vs radicular  MUSCLE  LENGTH: Hamstrings: Right 55 deg; Left 50 deg Thomas test: Right pos; Left pos  POSTURE: Scoliosis obvious, right trunk shift, even more pronounced lying down  PALPATION: Tender left IT band  LUMBAR ROM:   AROM eval  Flexion WNL  Extension 50%  Right lateral flexion WNL  Left lateral flexion Fingertips to just above joint line  Right rotation WNL  Left rotation WNL   (Blank  rows = not tested)  LOWER EXTREMITY ROM:     WFL  LOWER EXTREMITY MMT:    Generally 4 to 4+/5 with exception of hip extension 3+ bilaterally, hip abduction 4- right hip, 3+ left hip  LUMBAR SPECIAL TESTS:  Straight leg raise test: Negative, FABER test: Negative, and Thomas test: Positive  FUNCTIONAL TESTS:  5 times sit to stand: 10.74 sec Timed up and go (TUG): 9.40 sec  GAIT: Distance walked: 30 feet Assistive device utilized: None Level of assistance: Modified independence Comments: fairly normal heel to toe progression  TREATMENT DATE:  04/24/2024 Nustep x 6 min level 5 Seated mini sit ups 2 x 10 with 5lb kb (vc's to maintain neutral pelvis) Seated modified Russian twist with 5 lbs x 20 (10 each side) Seated shoulder to hip in reclined position with 5lb kb  Sit to stand x 10 holding 5 lb dumbbell Squat to table x 10 holding 5 lb dumbbell Peanut roll stretch x 10 to each side holding 5 sec each Peanut roll full out lateral trunk stretch pushing with legs over to left side only x 10 Seated ball roll outs with blue physio ball x 10 fwd and x 10 to left  with 2-3 second pause Palloff press x 10 each direction with 10 lbs at matrix [place] x 10 each way with 5 lb kb standing Supine PPT 2 x 10 Supine PPT + march x 20 bilateral vc's for maintaining tilt (did unsupported today) Supine PPT with dying bug unsupported x 20 Supine PPT with hand to opposite knee 2 x 10 each side with 3lbs (unsupported) Supine PPT with clamshell using black loop x 20 Supine PPT  bridge and clamshell with  black loop combo 2 x 10  Seated opposite arm to opposite knee with 3 lbs x 20 Standing opposite arm to opposite knee with 3 lbs x 20  04/17/2024 Nustep x 5 min level 4 Seated mini sit ups 2 x 10 with 5lb kb Seated modified [place] twist with 5 lbs x 20 (10 each side) Seated shoulder to hip in reclined position with 5lb kb  Peanut roll stretch x 10 to each side holding 5 sec each Seated ball roll outs with blue physio ball x 10 fwd and x 10 to left  with 2-3 second pause Supine PPT 2 x 10 Supine PPT + march x 20 bilateral vc's for maintaining tilt (did unsupported today) Supine PPT with dying bug unsupported x 20 Supine PPT with hand to opposite knee 2 x 10 each side with 3lbs (unsupported) Supine PPT with clamshell using black loop x 20 Supine PPT  bridge and clamshell with black loop combo 2 x 10 Seated opposite arm to opposite knee with 3 lbs x 20 Standing opposite arm to opposite knee with 3 lbs x 20   04/17/2024 Nustep x 5 min level 4 Standing hamstring stretch 3 x 30 each LE Standing quad stretch 3 x 30 sec Seated piriformis stretch 3 x 30 sec each LE Supine PPT 2 x 10 Supine PPT + march x 20 bilateral vc's for maintaining tilt Supine PPT with hand to opposite knee 2 x 10 each side with 2 lb dumbbells (had done 3lbs previously but 3's not available) Supine PPT with isometric press with hands on physio ball rested on knees x 20 Seated mini sit ups 2 x 10 with 5lb kb Seated modified Russian twist with 5 lbs x 20 (10 each side) Seated shoulder to hip in reclined  position with 5lb kb  Peanut roll stretch x 10 to each side holding 5 sec each On edge of step with balance poles hip hiking x 10 each LE Declined ice   PATIENT EDUCATION:  Education details: initiated HEP, Educated on proper sleeping posture and lumbar support, Educated on appropriate exercises and those to avoid to reduce stress to the lumbar spine Person educated: Patient Education method: Programmer, multimedia,  Demonstration, Verbal cues, and Handouts Education comprehension: verbalized understanding, returned demonstration, verbal cues required, and tactile cues required  HOME EXERCISE PROGRAM: Access Code: [email or web address] URL: [email or web address] Date: 04/17/2024 Prepared by: Delon Haddock  Exercises - Standing Hamstring Stretch on Chair  - 1 x daily - 7 x weekly - 1 sets - 3 reps - 30 sec hold - Quadricep Stretch with Chair and Counter Support  - 1 x daily - 7 x weekly - 1 sets - 3 reps - 30 sec hold - Seated Figure 4 Piriformis Stretch  - 1 x daily - 7 x weekly - 1 sets - 3 reps - 30 sed hold - Seated Lateral Trunk Stretch on Swiss Ball  - 1 x daily - 7 x weekly - 1 sets - 10 reps - 5 sec hold - Supine Posterior Pelvic Tilt  - 1 x daily - 7 x weekly - 2 sets - 10 reps - Bent Knee Fallouts  - 1 x daily - 7 x weekly - 1 sets - 10 reps - Supine March with Posterior Pelvic Tilt  - 1 x daily - 7 x weekly - 1 sets - 20 reps - Dead Bug  - 1 x daily - 7 x weekly - 1 sets - 20 reps - Clamshell  - 1 x daily - 7 x weekly - 2 sets - 10 reps - Abdominal Press into [place]  - 1 x daily - 7 x weekly - 2 sets - 10 reps ASSESSMENT:  CLINICAL IMPRESSION: [place] continues to progress appropriately.  She tolerates all progression of resistance with ease.  She is well motivated and compliant.  Parkinson's symptoms well controlled.   She would benefit from continuing skilled PT for flexibility and core strength.     OBJECTIVE IMPAIRMENTS: difficulty walking, decreased ROM, decreased strength, increased fascial restrictions, increased muscle spasms, impaired flexibility, improper body mechanics, postural dysfunction, and pain.   ACTIVITY LIMITATIONS: carrying, lifting, bending, standing, squatting, sleeping, stairs, transfers, bed mobility, bathing, toileting, dressing, and caring for others  PARTICIPATION LIMITATIONS: meal prep, cleaning, laundry, driving, shopping, community activity, and yard  work  PERSONAL FACTORS: Fitness and 1-2 comorbidities: Parkinsons and Gilberts syndrome are also affecting patient's functional outcome.   REHAB POTENTIAL: Good  CLINICAL DECISION MAKING: Evolving/moderate complexity  EVALUATION COMPLEXITY: Moderate   GOALS: Goals reviewed with patient? Yes  SHORT TERM GOALS: Target date: 04/20/2024  Pain report to be no greater than 4/10  Baseline: Goal status: MET 04/15/24  2.  Patient will be independent with initial HEP  Baseline:  Goal status: MET 04/17/24  3.  Patient to report 30% or better improvement in overall function Baseline:  Goal status: In progress   LONG TERM GOALS: Target date: 05/18/2024  Patient to report pain no greater than 2/10  Baseline:  Goal status: In progress  2.  Patient to be independent with advanced HEP  Baseline:  Goal status: In progress  3.  Patients Oswestry score to improve by 3-5 points Baseline:  Goal status: In Progress  4.  Functional scores to improve by 2-3 seconds Baseline:  Goal status: In Progress  5.  Patient to be able to sleep through the night  Baseline:  Goal status: In Progress  6.  Patient to report 85% improvement in overall symptoms Baseline:  Goal status: In Progress  PLAN:  PT FREQUENCY: 1-2x/week  PT DURATION: 8 weeks  PLANNED INTERVENTIONS: 97110-Therapeutic exercises, 97530- Therapeutic activity, [id number]- Neuromuscular re-education, 97535- Self Care, [id number]- Manual therapy, [phone]- Gait training, [phone]- Canalith repositioning, [id number]- Aquatic Therapy, [phone]- Electrical stimulation (unattended), [phone]- Electrical stimulation (manual), [id number]- Ultrasound, [id number]- Traction (mechanical), [id number]- Ionotophoresis 4mg /ml Dexamethasone , Patient/Family education, Balance training, Stair training, Taping, Joint mobilization, Spinal mobilization, Vestibular training, DME instructions, Cryotherapy, and Moist heat.  PLAN FOR NEXT SESSION: Nustep,  add tail wags, continue Hip hiking  and stability, continue LE flexibility, progress core strengthening, correcting trunk shift,  continue TA activation; hinging technique   Adriella Essex B. Abram Sax, PT 04/24/24 10:49 AM [facility] [address] [place], [place] [address] Phone # [phone] Fax [phone]

## 2024-04-28 ENCOUNTER — Ambulatory Visit

## 2024-04-28 DIAGNOSIS — M5459 Other low back pain: Secondary | ICD-10-CM | POA: Diagnosis not present

## 2024-04-28 DIAGNOSIS — R293 Abnormal posture: Secondary | ICD-10-CM

## 2024-04-28 DIAGNOSIS — M6281 Muscle weakness (generalized): Secondary | ICD-10-CM | POA: Diagnosis not present

## 2024-04-28 DIAGNOSIS — R262 Difficulty in walking, not elsewhere classified: Secondary | ICD-10-CM

## 2024-04-28 DIAGNOSIS — R252 Cramp and spasm: Secondary | ICD-10-CM | POA: Diagnosis not present

## 2024-04-28 NOTE — Therapy (Signed)
 OUTPATIENT PHYSICAL THERAPY THORACOLUMBAR TREATMENT Progress Note Reporting Period 03/23/24 to 04/28/24  See note below for Objective Data and Assessment of Progress/Goals.       Patient Name: Dominique Griffin MRN: [id number] DOB:1951/06/14, 73 y.o., female Today's Date: 04/28/2024  END OF SESSION:  PT End of Session - 04/28/24 1153     Visit Number 10    Number of Visits 16    Date for Recertification  05/18/24    Authorization Type [employer] Approved 16 visits-03/23/24-06/21/2024-auth#[id number]    Authorization Time Period 03/23/24-06/21/2024    Authorization - Visit Number 20    Authorization - Number of Visits 10    Progress Note Due on Visit 20    PT Start Time 1148    PT Stop Time 1230    PT Time Calculation (min) 42 min    Activity Tolerance Patient tolerated treatment well    Behavior During Therapy WFL for tasks assessed/performed            Past Medical History:  Diagnosis Date   Gilbert's syndrome    History of ITP    Osteopenia    Parkinson disease (HCC)    Past Surgical History:  Procedure Laterality Date   ABDOMINAL HYSTERECTOMY  1993   fibroids, ovaries remain   BREAST EXCISIONAL BIOPSY Left    skin bbiopsy   TONSILLECTOMY     Patient Active Problem List   Diagnosis Date Noted   Lumbar back pain with radiculopathy affecting left lower extremity 03/10/2024   Class 1 obesity due to excess calories without serious comorbidity with body mass index (BMI) of 30.0 to 30.9 in adult 12/15/2023   Gallstones 02/25/2023   B12 deficiency 01/07/2023   Dizziness - PPPD 10/24/2022   Chronic ITP (idiopathic thrombocytopenia) (HCC) 09/07/2022   Clinical trial exam 06/27/2021   Osteopenia 11/13/2020   Vitamin D deficiency 11/13/2020   Diverticulosis of colon 11/13/2020   Family history of colon cancer 11/13/2020   Arthritis 09/27/2020   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   Coccyx pain 09/27/2020   Parkinson's disease - Dr Evonnie 12/02/2019    PCP: Geofm Glade PARAS,  MD  REFERRING PROVIDER: Geofm Glade PARAS, MD  REFERRING DIAG: M54.9 (ICD-10-CM) - Back pain, unspecified back location, unspecified back pain laterality, unspecified chronicity  Rationale for Evaluation and Treatment: Rehabilitation  THERAPY DIAG:  Other low back pain  Abnormal posture  Muscle weakness (generalized)  Difficulty in walking, not elsewhere classified  Cramp and spasm  ONSET DATE: 03/19/2024  SUBJECTIVE:  SUBJECTIVE STATEMENT: Patient reports she continues to feel stronger and very little pain.      From Eval: Back pain since she was in her 40's. In her 50's fell from a ladder.  In her 22's, a lot of OA found.  In her 62's, diagnosed with Parkinsons.  Now very active doing [employer], multiple Parkinsons classes, Tai Chi, multiple stretching classes to try and deal with the parkinsons symptoms.  She was doing happy baby and felt pain in the low back flare up.  She was prescribed dose pack and other meds.  She hopes to eliminate the back pain so that she can continue working on controlling her Parkinson's symptoms.    PERTINENT HISTORY:  Parkinson's    PAIN:  04/28/24 Are you having pain? Yes: NPRS scale: 2/10 Pain location: Left lower back, left lateral LE, occasional left lateral knee Pain description: aching Aggravating factors: standing Relieving factors: exercise, meds  PRECAUTIONS: None  RED FLAGS: None   WEIGHT BEARING RESTRICTIONS: No  FALLS:  Has patient fallen in last 6 months? No  LIVING ENVIRONMENT: Lives with: lives with their family Lives in: House/apartment Stairs: Yes: Internal: 17 steps; on right going up and External: 5 steps; on right going up Has following equipment at home: None  OCCUPATION: retired   PLOF: Independent, Independent with basic  ADLs, Independent with household mobility without device, Independent with community mobility without device, Independent with homemaking with ambulation, Independent with gait, and Independent with transfers  PATIENT GOALS: To be able to continue to exercise to reduce her Parkinson's symptoms  NEXT MD VISIT: prn  OBJECTIVE:  Note: Objective measures were completed at Evaluation unless otherwise noted.  DIAGNOSTIC FINDINGS:  na  PATIENT SURVEYS:  Modified Oswestry:  MODIFIED OSWESTRY DISABILITY SCALE  Date: 03/23/24 Score  Total 14/50   Interpretation of scores: Score Category Description  0-20% Minimal Disability The patient can cope with most living activities. Usually no treatment is indicated apart from advice on lifting, sitting and exercise  21-40% Moderate Disability The patient experiences more pain and difficulty with sitting, lifting and standing. Travel and social life are more difficult and they may be disabled from work. Personal care, sexual activity and sleeping are not grossly affected, and the patient can usually be managed by conservative means  41-60% Severe Disability Pain remains the main problem in this group, but activities of daily living are affected. These patients require a detailed investigation  61-80% Crippled Back pain impinges on all aspects of the patient's life. Positive intervention is required  81-100% Bed-bound  These patients are either bed-bound or exaggerating their symptoms  [name], et al. Surgery versus conservative management of stable thoracolumbar fracture: the PRESTO feasibility RCT. [place] ([place]): [employer]; 2021 Nov. [facility] Technology Assessment, No. 25.62.) Appendix 3, Oswestry Disability Index category descriptors. Available from: FindJewel[employer].cz  Minimally Clinically Important Difference (MCID) = 12.8%  COGNITION: Overall cognitive status: Within functional limits for  tasks assessed     SENSATION: Occasional pain down the left outer thigh and into the knee but appears to be referred vs radicular  MUSCLE LENGTH: Hamstrings: Right 55 deg; Left 50 deg Thomas test: Right pos; Left pos  POSTURE: Scoliosis obvious, right trunk shift, even more pronounced lying down  PALPATION: Tender left IT band  LUMBAR ROM:   AROM eval 04/28/24  Flexion WNL   Extension 50%   Right lateral flexion WNL   Left lateral flexion Fingertips to just above joint  line   Right rotation WNL   Left rotation WNL    (Blank rows = not tested)  LOWER EXTREMITY ROM:     WFL  LOWER EXTREMITY MMT:    Initial eval: Generally 4 to 4+/5 with exception of hip extension 3+ bilaterally, hip abduction 4- right hip, 3+ left hip  04/28/24: Improved to 4+/5 on all   LUMBAR SPECIAL TESTS:  Straight leg raise test: Negative, FABER test: Negative, and Thomas test: Positive  FUNCTIONAL TESTS:  Initial eval: 5 times sit to stand: 10.74 sec Timed up and go (TUG): 9.40 sec  04/28/24: 5 times sit to stand: 7.29 sec Timed up and go (TUG): 7.05 sec  GAIT: Distance walked: 30 feet Assistive device utilized: None Level of assistance: Modified independence Comments: fairly normal heel to toe progression  TREATMENT DATE:  04/28/2024 Nustep x 7 min level 5 10th visit re-assessment: see below Hooklying heel reaches 2 x 20 Supine PPT 2 x 10 Supine PPT + march x 20 bilateral vc's for maintaining tilt (did unsupported today) Supine PPT with dying bug unsupported x 20 Supine PPT with hand to opposite knee 2 x 10 each side with 3lbs (unsupported) Supine PPT with clamshell using black loop x 20 Supine PPT  bridge and clamshell with black loop combo 2 x 10 Sit to stand x 10 holding 2 x 3 lb dumbbell Squat to table x 10 holding 2 x 3 lb dumbbell Seated mini sit ups 2 x 10 with 2 x 3lb kb (vc's to maintain neutral pelvis) Seated modified Russian twist with 2 x 3 lbs x 20 (10 each  side) Seated shoulder to hip in reclined position with 5lb kb  Peanut roll stretch x 10 to each side holding 5 sec each Palloff press x 10 each direction with 10 lbs at matrix Around the [place] x 10 each way with 5 lb kb standing on balance pad Seated opposite arm to opposite knee with 3 lbs x 20 Standing opposite arm to opposite knee with 3 lbs x 20  04/24/2024 Nustep x 6 min level 5 Seated mini sit ups 2 x 10 with 5lb kb (vc's to maintain neutral pelvis) Seated modified Russian twist with 5 lbs x 20 (10 each side) Seated shoulder to hip in reclined position with 5lb kb  Sit to stand x 10 holding 5 lb dumbbell Squat to table x 10 holding 5 lb dumbbell Peanut roll stretch x 10 to each side holding 5 sec each Peanut roll full out lateral trunk stretch pushing with legs over to left side only x 10 Seated ball roll outs with blue physio ball x 10 fwd and x 10 to left  with 2-3 second pause Palloff press x 10 each direction with 10 lbs at matrix [place] x 10 each way with 5 lb kb standing Supine PPT 2 x 10 Supine PPT + march x 20 bilateral vc's for maintaining tilt (did unsupported today) Supine PPT with dying bug unsupported x 20 Supine PPT with hand to opposite knee 2 x 10 each side with 3lbs (unsupported) Supine PPT with clamshell using black loop x 20 Supine PPT  bridge and clamshell with black loop combo 2 x 10 Seated opposite arm to opposite knee with 3 lbs x 20 Standing opposite arm to opposite knee with 3 lbs x 20  04/17/2024 Nustep x 5 min level 4 Seated mini sit ups 2 x 10 with 5lb kb Seated modified [place] twist with 5 lbs x 20 (  10 each side) Seated shoulder to hip in reclined position with 5lb kb  Peanut roll stretch x 10 to each side holding 5 sec each Seated ball roll outs with blue physio ball x 10 fwd and x 10 to left  with 2-3 second pause Supine PPT 2 x 10 Supine PPT + march x 20 bilateral vc's for maintaining tilt (did unsupported today) Supine PPT with  dying bug unsupported x 20 Supine PPT with hand to opposite knee 2 x 10 each side with 3lbs (unsupported) Supine PPT with clamshell using black loop x 20 Supine PPT  bridge and clamshell with black loop combo 2 x 10 Seated opposite arm to opposite knee with 3 lbs x 20 Standing opposite arm to opposite knee with 3 lbs x 20   PATIENT EDUCATION:  Education details: initiated HEP, Educated on proper sleeping posture and lumbar support, Educated on appropriate exercises and those to avoid to reduce stress to the lumbar spine Person educated: Patient Education method: Programmer, multimedia, Demonstration, Verbal cues, and Handouts Education comprehension: verbalized understanding, returned demonstration, verbal cues required, and tactile cues required  HOME EXERCISE PROGRAM: Access Code: [email or web address] URL: [email or web address] Date: 04/17/2024 Prepared by: Delon Haddock  Exercises - Standing Hamstring Stretch on Chair  - 1 x daily - 7 x weekly - 1 sets - 3 reps - 30 sec hold - Quadricep Stretch with Chair and Counter Support  - 1 x daily - 7 x weekly - 1 sets - 3 reps - 30 sec hold - Seated Figure 4 Piriformis Stretch  - 1 x daily - 7 x weekly - 1 sets - 3 reps - 30 sed hold - Seated Lateral Trunk Stretch on Swiss Ball  - 1 x daily - 7 x weekly - 1 sets - 10 reps - 5 sec hold - Supine Posterior Pelvic Tilt  - 1 x daily - 7 x weekly - 2 sets - 10 reps - Bent Knee Fallouts  - 1 x daily - 7 x weekly - 1 sets - 10 reps - Supine March with Posterior Pelvic Tilt  - 1 x daily - 7 x weekly - 1 sets - 20 reps - Dead Bug  - 1 x daily - 7 x weekly - 1 sets - 20 reps - Clamshell  - 1 x daily - 7 x weekly - 2 sets - 10 reps - Abdominal Press into [place]  - 1 x daily - 7 x weekly - 2 sets - 10 reps ASSESSMENT:  CLINICAL IMPRESSION: Hasini is more consistent with symmetrical posture.  She does tend to resume the shift once she fatigues.  Her Parkinsons symptoms are still well controlled even with  fatigue.  She should continue to do well.   She would benefit from continuing skilled PT for flexibility and core strength.     OBJECTIVE IMPAIRMENTS: difficulty walking, decreased ROM, decreased strength, increased fascial restrictions, increased muscle spasms, impaired flexibility, improper body mechanics, postural dysfunction, and pain.   ACTIVITY LIMITATIONS: carrying, lifting, bending, standing, squatting, sleeping, stairs, transfers, bed mobility, bathing, toileting, dressing, and caring for others  PARTICIPATION LIMITATIONS: meal prep, cleaning, laundry, driving, shopping, community activity, and yard work  PERSONAL FACTORS: Fitness and 1-2 comorbidities: Parkinsons and Gilberts syndrome are also affecting patient's functional outcome.   REHAB POTENTIAL: Good  CLINICAL DECISION MAKING: Evolving/moderate complexity  EVALUATION COMPLEXITY: Moderate   GOALS: Goals reviewed with patient? Yes  SHORT TERM GOALS: Target date: 04/20/2024  Pain report to be no greater than 4/10  Baseline: Goal status: MET 04/15/24  2.  Patient will be independent with initial HEP  Baseline:  Goal status: MET 04/17/24  3.  Patient to report 30% or better improvement in overall function Baseline:  Goal status: In progress   LONG TERM GOALS: Target date: 05/18/2024  Patient to report pain no greater than 2/10  Baseline:  Goal status: In progress  2.  Patient to be independent with advanced HEP  Baseline:  Goal status: In progress  3.  Patients Oswestry score to improve by 3-5 points Baseline:  Goal status: In Progress  4.  Functional scores to improve by 2-3 seconds Baseline:  Goal status: In Progress  5.  Patient to be able to sleep through the night  Baseline:  Goal status: In Progress  6.  Patient to report 85% improvement in overall symptoms Baseline:  Goal status: In Progress  PLAN:  PT FREQUENCY: 1-2x/week  PT DURATION: 8 weeks  PLANNED INTERVENTIONS:  97110-Therapeutic exercises, 97530- Therapeutic activity, [id number]- Neuromuscular re-education, 97535- Self Care, [id number]- Manual therapy, [phone]- Gait training, [phone]- Canalith repositioning, [id number]- Aquatic Therapy, [phone]- Electrical stimulation (unattended), [id number]- Electrical stimulation (manual), [id number]- Ultrasound, [id number]- Traction (mechanical), [id number]- Ionotophoresis 4mg /ml Dexamethasone , Patient/Family education, Balance training, Stair training, Taping, Joint mobilization, Spinal mobilization, Vestibular training, DME instructions, Cryotherapy, and Moist heat.  PLAN FOR NEXT SESSION: Nustep,  add tail wags, continue Hip hiking and stability, continue LE flexibility, progress core strengthening, correcting trunk shift,  continue TA activation; hinging technique   Aisea Bouldin B. Eun Vermeer, PT 04/28/24 2:41 PM [facility] [address], [address] [place], [place] [address] Phone # [phone] Fax [phone]

## 2024-04-29 ENCOUNTER — Ambulatory Visit

## 2024-04-29 DIAGNOSIS — R252 Cramp and spasm: Secondary | ICD-10-CM

## 2024-04-29 DIAGNOSIS — R262 Difficulty in walking, not elsewhere classified: Secondary | ICD-10-CM

## 2024-04-29 DIAGNOSIS — M6281 Muscle weakness (generalized): Secondary | ICD-10-CM | POA: Diagnosis not present

## 2024-04-29 DIAGNOSIS — R293 Abnormal posture: Secondary | ICD-10-CM

## 2024-04-29 DIAGNOSIS — M5459 Other low back pain: Secondary | ICD-10-CM | POA: Diagnosis not present

## 2024-04-29 NOTE — Therapy (Signed)
 OUTPATIENT PHYSICAL THERAPY THORACOLUMBAR TREATMENT Progress Note Reporting Period 03/23/24 to 04/28/24  See note below for Objective Data and Assessment of Progress/Goals.       Patient Name: Dominique Griffin MRN: [id number] DOB:[birth year], 73 y.o., female Today's Date: 04/29/2024  END OF SESSION:  PT End of Session - 04/29/24 1225     Visit Number 11    Number of Visits 16    Date for Recertification  05/18/24    Authorization Type [employer] Approved 16 visits-03/23/24-06/21/2024-auth#[id number]    Authorization Time Period 03/23/24-06/21/2024    Authorization - Visit Number 11    Authorization - Number of Visits 16    Progress Note Due on Visit 20    PT Start Time 1225    PT Stop Time 1315    PT Time Calculation (min) 50 min    Activity Tolerance Patient tolerated treatment well    Behavior During Therapy WFL for tasks assessed/performed            Past Medical History:  Diagnosis Date   Gilbert's syndrome    History of ITP    Osteopenia    Parkinson disease (HCC)    Past Surgical History:  Procedure Laterality Date   ABDOMINAL HYSTERECTOMY  1993   fibroids, ovaries remain   BREAST EXCISIONAL BIOPSY Left    skin bbiopsy   TONSILLECTOMY     Patient Active Problem List   Diagnosis Date Noted   Lumbar back pain with radiculopathy affecting left lower extremity 03/10/2024   Class 1 obesity due to excess calories without serious comorbidity with body mass index (BMI) of 30.0 to 30.9 in adult 12/15/2023   Gallstones 02/25/2023   B12 deficiency 01/07/2023   Dizziness - PPPD 10/24/2022   Chronic ITP (idiopathic thrombocytopenia) (HCC) 09/07/2022   Clinical trial exam 06/27/2021   Osteopenia 11/13/2020   Vitamin D deficiency 11/13/2020   Diverticulosis of colon 11/13/2020   Family history of colon cancer 11/13/2020   Arthritis 09/27/2020   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   Coccyx pain 09/27/2020   Parkinson's disease - Dr Evonnie 12/02/2019    PCP: Geofm Glade PARAS,  MD  REFERRING PROVIDER: Geofm Glade PARAS, MD  REFERRING DIAG: M54.9 (ICD-10-CM) - Back pain, unspecified back location, unspecified back pain laterality, unspecified chronicity  Rationale for Evaluation and Treatment: Rehabilitation  THERAPY DIAG:  Other low back pain  Abnormal posture  Muscle weakness (generalized)  Cramp and spasm  Difficulty in walking, not elsewhere classified  ONSET DATE: 03/19/2024  SUBJECTIVE:  SUBJECTIVE STATEMENT: Patient reports feeling better all the time      From Eval: Back pain since she was in her 40's. In her 50's fell from a ladder.  In her 61's, a lot of OA found.  In her 62's, diagnosed with Parkinsons.  Now very active doing [employer], multiple Parkinsons classes, Tai Chi, multiple stretching classes to try and deal with the parkinsons symptoms.  She was doing happy baby and felt pain in the low back flare up.  She was prescribed dose pack and other meds.  She hopes to eliminate the back pain so that she can continue working on controlling her Parkinson's symptoms.    PERTINENT HISTORY:  Parkinson's    PAIN:  04/29/24 Are you having pain? Yes: NPRS scale: 1-2/10 Pain location: Left lower back, left lateral LE, occasional left lateral knee Pain description: aching Aggravating factors: standing Relieving factors: exercise, meds  PRECAUTIONS: None  RED FLAGS: None   WEIGHT BEARING RESTRICTIONS: No  FALLS:  Has patient fallen in last 6 months? No  LIVING ENVIRONMENT: Lives with: lives with their family Lives in: House/apartment Stairs: Yes: Internal: 17 steps; on right going up and External: 5 steps; on right going up Has following equipment at home: None  OCCUPATION: retired   PLOF: Independent, Independent with basic ADLs, Independent with  household mobility without device, Independent with community mobility without device, Independent with homemaking with ambulation, Independent with gait, and Independent with transfers  PATIENT GOALS: To be able to continue to exercise to reduce her Parkinson's symptoms  NEXT MD VISIT: prn  OBJECTIVE:  Note: Objective measures were completed at Evaluation unless otherwise noted.  DIAGNOSTIC FINDINGS:  na  PATIENT SURVEYS:  Modified Oswestry:  MODIFIED OSWESTRY DISABILITY SCALE  Date: 03/23/24 Score  Total 14/50   Interpretation of scores: Score Category Description  0-20% Minimal Disability The patient can cope with most living activities. Usually no treatment is indicated apart from advice on lifting, sitting and exercise  21-40% Moderate Disability The patient experiences more pain and difficulty with sitting, lifting and standing. Travel and social life are more difficult and they may be disabled from work. Personal care, sexual activity and sleeping are not grossly affected, and the patient can usually be managed by conservative means  41-60% Severe Disability Pain remains the main problem in this group, but activities of daily living are affected. These patients require a detailed investigation  61-80% Crippled Back pain impinges on all aspects of the patient's life. Positive intervention is required  81-100% Bed-bound  These patients are either bed-bound or exaggerating their symptoms  [name], et al. Surgery versus conservative management of stable thoracolumbar fracture: the PRESTO feasibility RCT. [place]): [employer]; 2021 Nov. [facility] Technology Assessment, No. 25.62.) Appendix 3, Oswestry Disability Index category descriptors. Available from: FindJewel[employer].cz  Minimally Clinically Important Difference (MCID) = 12.8%  COGNITION: Overall cognitive status: Within functional limits for tasks  assessed     SENSATION: Occasional pain down the left outer thigh and into the knee but appears to be referred vs radicular  MUSCLE LENGTH: Hamstrings: Right 55 deg; Left 50 deg Thomas test: Right pos; Left pos  POSTURE: Scoliosis obvious, right trunk shift, even more pronounced lying down  PALPATION: Tender left IT band  LUMBAR ROM:   AROM eval 04/28/24  Flexion WNL   Extension 50%   Right lateral flexion WNL   Left lateral flexion Fingertips to just above joint line   Right  rotation WNL   Left rotation WNL    (Blank rows = not tested)  LOWER EXTREMITY ROM:     WFL  LOWER EXTREMITY MMT:    Initial eval: Generally 4 to 4+/5 with exception of hip extension 3+ bilaterally, hip abduction 4- right hip, 3+ left hip  04/28/24: Improved to 4+/5 on all   LUMBAR SPECIAL TESTS:  Straight leg raise test: Negative, FABER test: Negative, and Thomas test: Positive  FUNCTIONAL TESTS:  Initial eval: 5 times sit to stand: 10.74 sec Timed up and go (TUG): 9.40 sec  04/28/24: 5 times sit to stand: 7.29 sec Timed up and go (TUG): 7.05 sec  GAIT: Distance walked: 30 feet Assistive device utilized: None Level of assistance: Modified independence Comments: fairly normal heel to toe progression  TREATMENT DATE:  04/29/2024 Nustep x 7 min level 5 10th visit re-assessment: see below Hooklying heel reaches 2 x 20 Supine PPT 2 x 10 Supine PPT + march x 20 bilateral vc's for maintaining tilt (did unsupported today) Supine PPT with dying bug unsupported x 20 Supine PPT with hand to opposite knee 2 x 10 each side with 3lbs (unsupported) Supine PPT with clamshell using black loop x 20 Supine PPT  bridge and clamshell with black loop combo 2 x 10 Sit to stand x 10 holding 10 lb kb Squat to table x 10 holding 10 lb kb Seated mini sit ups 2 x 10 with 10 lb kb  Seated modified [place] twist with 10 lb kb x 20 (10 each side) Seated shoulder to hip in reclined position with 10lb kb   Around [place] x 10 each way with 10 lb kb standing on balance pad Seated opposite arm to opposite knee with 3 lbs x 20 Standing opposite arm to opposite knee with 3 lbs x 20 Peanut roll stretch x 10 to each side holding 5 sec each Palloff press x 10 each direction with 10 lbs at matrix standing  on balance pad.  Ice x 10 min to lumbar spine in hook lying  04/28/2024 Nustep x 7 min level 5 10th visit re-assessment: see below Hooklying heel reaches 2 x 20 Supine PPT 2 x 10 Supine PPT + march x 20 bilateral vc's for maintaining tilt (did unsupported today) Supine PPT with dying bug unsupported x 20 Supine PPT with hand to opposite knee 2 x 10 each side with 3lbs (unsupported) Supine PPT with clamshell using black loop x 20 Supine PPT  bridge and clamshell with black loop combo 2 x 10 Sit to stand x 10 holding 2 x 3 lb dumbbell Squat to table x 10 holding 2 x 3 lb dumbbell Seated mini sit ups 2 x 10 with 2 x 3lb kb (vc's to maintain neutral pelvis) Seated modified [place] twist with 2 x 3 lbs x 20 (10 each side) Seated shoulder to hip in reclined position with 5lb kb  Peanut roll stretch x 10 to each side holding 5 sec each Palloff press x 10 each direction with 10 lbs at matrix [place] x 10 each way with 5 lb kb standing on balance pad Seated opposite arm to opposite knee with 3 lbs x 20 Standing opposite arm to opposite knee with 3 lbs x 20  04/24/2024 Nustep x 6 min level 5 Seated mini sit ups 2 x 10 with 5lb kb (vc's to maintain neutral pelvis) Seated modified [place] twist with 5 lbs x 20 (10 each side) Seated shoulder to hip in reclined  position with 5lb kb  Sit to stand x 10 holding 5 lb dumbbell Squat to table x 10 holding 5 lb dumbbell Peanut roll stretch x 10 to each side holding 5 sec each Peanut roll full out lateral trunk stretch pushing with legs over to left side only x 10 Seated ball roll outs with blue physio ball x 10 fwd and x 10 to left  with 2-3  second pause Palloff press x 10 each direction with 10 lbs at matrix [place] x 10 each way with 5 lb kb standing Supine PPT 2 x 10 Supine PPT + march x 20 bilateral vc's for maintaining tilt (did unsupported today) Supine PPT with dying bug unsupported x 20 Supine PPT with hand to opposite knee 2 x 10 each side with 3lbs (unsupported) Supine PPT with clamshell using black loop x 20 Supine PPT  bridge and clamshell with black loop combo 2 x 10 Seated opposite arm to opposite knee with 3 lbs x 20 Standing opposite arm to opposite knee with 3 lbs x 20    PATIENT EDUCATION:  Education details: initiated HEP, Educated on proper sleeping posture and lumbar support, Educated on appropriate exercises and those to avoid to reduce stress to the lumbar spine Person educated: Patient Education method: Programmer, multimedia, Demonstration, Verbal cues, and Handouts Education comprehension: verbalized understanding, returned demonstration, verbal cues required, and tactile cues required  HOME EXERCISE PROGRAM: Access Code: [email or web address] URL: [email or web address] Date: 04/17/2024 Prepared by: Delon Haddock  Exercises - Standing Hamstring Stretch on Chair  - 1 x daily - 7 x weekly - 1 sets - 3 reps - 30 sec hold - Quadricep Stretch with Chair and Counter Support  - 1 x daily - 7 x weekly - 1 sets - 3 reps - 30 sec hold - Seated Figure 4 Piriformis Stretch  - 1 x daily - 7 x weekly - 1 sets - 3 reps - 30 sed hold - Seated Lateral Trunk Stretch on Swiss Ball  - 1 x daily - 7 x weekly - 1 sets - 10 reps - 5 sec hold - Supine Posterior Pelvic Tilt  - 1 x daily - 7 x weekly - 2 sets - 10 reps - Bent Knee Fallouts  - 1 x daily - 7 x weekly - 1 sets - 10 reps - Supine March with Posterior Pelvic Tilt  - 1 x daily - 7 x weekly - 1 sets - 20 reps - Dead Bug  - 1 x daily - 7 x weekly - 1 sets - 20 reps - Clamshell  - 1 x daily - 7 x weekly - 2 sets - 10 reps - Abdominal Press into [place]  - 1 x  daily - 7 x weekly - 2 sets - 10 reps ASSESSMENT:  CLINICAL IMPRESSION: Deonna continues to do well.  No soreness from yesterday.   She should continue to do well.   She would benefit from continuing skilled PT for flexibility and core strength.     OBJECTIVE IMPAIRMENTS: difficulty walking, decreased ROM, decreased strength, increased fascial restrictions, increased muscle spasms, impaired flexibility, improper body mechanics, postural dysfunction, and pain.   ACTIVITY LIMITATIONS: carrying, lifting, bending, standing, squatting, sleeping, stairs, transfers, bed mobility, bathing, toileting, dressing, and caring for others  PARTICIPATION LIMITATIONS: meal prep, cleaning, laundry, driving, shopping, community activity, and yard work  PERSONAL FACTORS: Fitness and 1-2 comorbidities: Parkinsons and Gilberts syndrome are also affecting patient's functional outcome.   REHAB POTENTIAL: Good  CLINICAL  DECISION MAKING: Evolving/moderate complexity  EVALUATION COMPLEXITY: Moderate   GOALS: Goals reviewed with patient? Yes  SHORT TERM GOALS: Target date: 04/20/2024  Pain report to be no greater than 4/10  Baseline: Goal status: MET 04/15/24  2.  Patient will be independent with initial HEP  Baseline:  Goal status: MET 04/17/24  3.  Patient to report 30% or better improvement in overall function Baseline:  Goal status: MET 04/29/24   LONG TERM GOALS: Target date: 05/18/2024  Patient to report pain no greater than 2/10  Baseline:  Goal status: MET 04/29/24  2.  Patient to be independent with advanced HEP  Baseline:  Goal status: MET 04/29/24  3.  Patients Oswestry score to improve by 3-5 points Baseline:  Goal status: In Progress  4.  Functional scores to improve by 2-3 seconds Baseline:  Goal status: MET 04/28/24  5.  Patient to be able to sleep through the night  Baseline:  Goal status: In Progress  6.  Patient to report 85% improvement in overall  symptoms Baseline:  Goal status: In Progress  PLAN:  PT FREQUENCY: 1-2x/week  PT DURATION: 8 weeks  PLANNED INTERVENTIONS: 97110-Therapeutic exercises, 97530- Therapeutic activity, [id number]- Neuromuscular re-education, 97535- Self Care, [id number]- Manual therapy, [phone]- Gait training, [phone]- Canalith repositioning, [id number]- Aquatic Therapy, [phone]- Electrical stimulation (unattended), [phone]- Electrical stimulation (manual), [id number]- Ultrasound, [id number]- Traction (mechanical), [id number]- Ionotophoresis 4mg /ml Dexamethasone , Patient/Family education, Balance training, Stair training, Taping, Joint mobilization, Spinal mobilization, Vestibular training, DME instructions, Cryotherapy, and Moist heat.  PLAN FOR NEXT SESSION: Nustep,  add tail wags, continue Hip hiking and stability, continue LE flexibility, progress core strengthening, correcting trunk shift,  continue TA activation; hinging technique   Lorin Gawron B. Lakita Sahlin, PT 04/29/24 1:17 PM [facility] [address], [address] [place], [place] [address] Phone # [phone] [phone]

## 2024-05-01 ENCOUNTER — Encounter

## 2024-05-05 ENCOUNTER — Ambulatory Visit: Payer: Self-pay

## 2024-05-05 ENCOUNTER — Ambulatory Visit

## 2024-05-05 DIAGNOSIS — M6281 Muscle weakness (generalized): Secondary | ICD-10-CM

## 2024-05-05 DIAGNOSIS — R252 Cramp and spasm: Secondary | ICD-10-CM

## 2024-05-05 DIAGNOSIS — R293 Abnormal posture: Secondary | ICD-10-CM | POA: Diagnosis not present

## 2024-05-05 DIAGNOSIS — M5459 Other low back pain: Secondary | ICD-10-CM | POA: Diagnosis not present

## 2024-05-05 DIAGNOSIS — R262 Difficulty in walking, not elsewhere classified: Secondary | ICD-10-CM

## 2024-05-05 NOTE — Therapy (Signed)
 OUTPATIENT PHYSICAL THERAPY THORACOLUMBAR TREATMENT Progress Note Reporting Period 03/23/24 to 04/28/24  See note below for Objective Data and Assessment of Progress/Goals.       Patient Name: Dominique Griffin MRN: [id number] DOB:Sep 25, 1950, 73 y.o., female Today's Date: 05/05/2024  END OF SESSION:  PT End of Session - 05/05/24 1158     Visit Number 12    Number of Visits 16    Date for Recertification  05/18/24    Authorization Type [employer] Approved 16 visits-03/23/24-06/21/2024-auth#[id number]    Authorization Time Period 03/23/24-06/21/2024    Authorization - Visit Number 12    Authorization - Number of Visits 16    Progress Note Due on Visit 20    PT Start Time 1151    PT Stop Time 1230    PT Time Calculation (min) 39 min    Activity Tolerance Patient tolerated treatment well    Behavior During Therapy WFL for tasks assessed/performed            Past Medical History:  Diagnosis Date   Gilbert's syndrome    History of ITP    Osteopenia    Parkinson disease (HCC)    Past Surgical History:  Procedure Laterality Date   ABDOMINAL HYSTERECTOMY  1993   fibroids, ovaries remain   BREAST EXCISIONAL BIOPSY Left    skin bbiopsy   TONSILLECTOMY     Patient Active Problem List   Diagnosis Date Noted   Lumbar back pain with radiculopathy affecting left lower extremity 03/10/2024   Class 1 obesity due to excess calories without serious comorbidity with body mass index (BMI) of 30.0 to 30.9 in adult 12/15/2023   Gallstones 02/25/2023   B12 deficiency 01/07/2023   Dizziness - PPPD 10/24/2022   Chronic ITP (idiopathic thrombocytopenia) (HCC) 09/07/2022   Clinical trial exam 06/27/2021   Osteopenia 11/13/2020   Vitamin D deficiency 11/13/2020   Diverticulosis of colon 11/13/2020   Family history of colon cancer 11/13/2020   Arthritis 09/27/2020   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   Coccyx pain 09/27/2020   Parkinson's disease - Dr Evonnie 12/02/2019    PCP: Geofm Glade PARAS,  MD  REFERRING PROVIDER: Geofm Glade PARAS, MD  REFERRING DIAG: M54.9 (ICD-10-CM) - Back pain, unspecified back location, unspecified back pain laterality, unspecified chronicity  Rationale for Evaluation and Treatment: Rehabilitation  THERAPY DIAG:  Other low back pain  Abnormal posture  Muscle weakness (generalized)  Cramp and spasm  Difficulty in walking, not elsewhere classified  ONSET DATE: 03/19/2024  SUBJECTIVE:  SUBJECTIVE STATEMENT: Patient reports just a range of pain from 2-4  She explains that its primarily first thing in the morning.  I trimmed my hosta yesterday and I did fine. I am trying to figure out when I should go back to rock steady boxing (her Parkinsons class).    From Eval: Back pain since she was in her 40's. In her 50's fell from a ladder.  In her 35's, a lot of OA found.  In her 39's, diagnosed with Parkinsons.  Now very active doing [employer], multiple Parkinsons classes, Tai Chi, multiple stretching classes to try and deal with the parkinsons symptoms.  She was doing happy baby and felt pain in the low back flare up.  She was prescribed dose pack and other meds.  She hopes to eliminate the back pain so that she can continue working on controlling her Parkinson's symptoms.    PERTINENT HISTORY:  Parkinson's    PAIN:  05/05/24 Are you having pain? Yes: NPRS scale: 2-4/10 Pain location: Left lower back, left lateral LE, occasional left lateral knee Pain description: aching Aggravating factors: standing Relieving factors: exercise, meds  PRECAUTIONS: None  RED FLAGS: None   WEIGHT BEARING RESTRICTIONS: No  FALLS:  Has patient fallen in last 6 months? No  LIVING ENVIRONMENT: Lives with: lives with their family Lives in: House/apartment Stairs: Yes: Internal:  17 steps; on right going up and External: 5 steps; on right going up Has following equipment at home: None  OCCUPATION: retired   PLOF: Independent, Independent with basic ADLs, Independent with household mobility without device, Independent with community mobility without device, Independent with homemaking with ambulation, Independent with gait, and Independent with transfers  PATIENT GOALS: To be able to continue to exercise to reduce her Parkinson's symptoms  NEXT MD VISIT: prn  OBJECTIVE:  Note: Objective measures were completed at Evaluation unless otherwise noted.  DIAGNOSTIC FINDINGS:  na  PATIENT SURVEYS:  Modified Oswestry:  MODIFIED OSWESTRY DISABILITY SCALE  Date: 03/23/24 Score  Total 14/50   Interpretation of scores: Score Category Description  0-20% Minimal Disability The patient can cope with most living activities. Usually no treatment is indicated apart from advice on lifting, sitting and exercise  21-40% Moderate Disability The patient experiences more pain and difficulty with sitting, lifting and standing. Travel and social life are more difficult and they may be disabled from work. Personal care, sexual activity and sleeping are not grossly affected, and the patient can usually be managed by conservative means  41-60% Severe Disability Pain remains the main problem in this group, but activities of daily living are affected. These patients require a detailed investigation  61-80% Crippled Back pain impinges on all aspects of the patient's life. Positive intervention is required  81-100% Bed-bound  These patients are either bed-bound or exaggerating their symptoms  [name], et al. Surgery versus conservative management of stable thoracolumbar fracture: the PRESTO feasibility RCT. [place] ([place]): [employer]; 2021 Nov. [facility] Technology Assessment, No. 25.62.) Appendix 3, Oswestry Disability Index category descriptors. Available from:  [email or web address].cz  Minimally Clinically Important Difference (MCID) = 12.8%  COGNITION: Overall cognitive status: Within functional limits for tasks assessed     SENSATION: Occasional pain down the left outer thigh and into the knee but appears to be referred vs radicular  MUSCLE LENGTH: Hamstrings: Right 55 deg; Left 50 deg Thomas test: Right pos; Left pos  POSTURE: Scoliosis obvious, right trunk shift, even more pronounced lying down  PALPATION:  Tender left IT band  LUMBAR ROM:   AROM eval 04/28/24  Flexion WNL   Extension 50%   Right lateral flexion WNL   Left lateral flexion Fingertips to just above joint line   Right rotation WNL   Left rotation WNL    (Blank rows = not tested)  LOWER EXTREMITY ROM:     WFL  LOWER EXTREMITY MMT:    Initial eval: Generally 4 to 4+/5 with exception of hip extension 3+ bilaterally, hip abduction 4- right hip, 3+ left hip  04/28/24: Improved to 4+/5 on all   LUMBAR SPECIAL TESTS:  Straight leg raise test: Negative, FABER test: Negative, and Thomas test: Positive  FUNCTIONAL TESTS:  Initial eval: 5 times sit to stand: 10.74 sec Timed up and go (TUG): 9.40 sec  04/28/24: 5 times sit to stand: 7.29 sec Timed up and go (TUG): 7.05 sec  GAIT: Distance walked: 30 feet Assistive device utilized: None Level of assistance: Modified independence Comments: fairly normal heel to toe progression  TREATMENT DATE:  05/05/2024 Nustep x 7 min level 5 Tail wags 2 x 20 Supine heel grabs 2 x 20 Supine PPT 2 x 10 Supine PPT + march 2 x 20 bilateral vc's for maintaining tilt (did unsupported today) Supine PPT with dying bug unsupported x 20 Supine PPT with hand to opposite knee 2 x 10 each side with 3lbs (unsupported) Supine PPT with clamshell using black loop x 20 Supine PPT  bridge and clamshell with black loop combo 2 x 10 Sit to stand x 10 holding 10 lb kb Squat to table x 10 holding 10 lb kb Seated  mini sit ups 2 x 10 with 10 lb kb  Seated modified Russian twist with 10 lb kb x 20 (10 each side) Seated shoulder to hip in reclined position with 10lb kb  Around the [place] x 10 each way with 10 lb kb standing on balance pad Seated opposite arm to opposite knee with 3 lbs x 20  Standing opposite arm to opposite knee with 3 lbs x 20 on balance pad Peanut roll stretch x 10 to each side holding 5 sec each Palloff press x 10 each direction with 10 lbs at matrix standing  on balance pad.  Declined ice  04/29/2024 Nustep x 7 min level 5 10th visit re-assessment: see below Hooklying heel reaches 2 x 20 Supine PPT 2 x 10 Supine PPT + march x 20 bilateral vc's for maintaining tilt (did unsupported today) Supine PPT with dying bug unsupported x 20 Supine PPT with hand to opposite knee 2 x 10 each side with 3lbs (unsupported) Supine PPT with clamshell using black loop x 20 Supine PPT  bridge and clamshell with black loop combo 2 x 10 Sit to stand x 10 holding 10 lb kb Squat to table x 10 holding 10 lb kb Seated mini sit ups 2 x 10 with 10 lb kb  Seated modified Russian twist with 10 lb kb x 20 (10 each side) Seated shoulder to hip in reclined position with 10lb kb  [place] x 10 each way with 10 lb kb standing on balance pad Seated opposite arm to opposite knee with 3 lbs x 20 Standing opposite arm to opposite knee with 3 lbs x 20 Peanut roll stretch x 10 to each side holding 5 sec each Palloff press x 10 each direction with 10 lbs at matrix standing  on balance pad.  Ice x 10 min to lumbar spine in hook  lying  04/28/2024 Nustep x 7 min level 5 10th visit re-assessment: see below Hooklying heel reaches 2 x 20 Supine PPT 2 x 10 Supine PPT + march x 20 bilateral vc's for maintaining tilt (did unsupported today) Supine PPT with dying bug unsupported x 20 Supine PPT with hand to opposite knee 2 x 10 each side with 3lbs (unsupported) Supine PPT with clamshell using black loop x  20 Supine PPT  bridge and clamshell with black loop combo 2 x 10 Sit to stand x 10 holding 2 x 3 lb dumbbell Squat to table x 10 holding 2 x 3 lb dumbbell Seated mini sit ups 2 x 10 with 2 x 3lb kb (vc's to maintain neutral pelvis) Seated modified Russian twist with 2 x 3 lbs x 20 (10 each side) Seated shoulder to hip in reclined position with 5lb kb  Peanut roll stretch x 10 to each side holding 5 sec each Palloff press x 10 each direction with 10 lbs at matrix Around the [place] x 10 each way with 5 lb kb standing on balance pad Seated opposite arm to opposite knee with 3 lbs x 20 Standing opposite arm to opposite knee with 3 lbs x 20   PATIENT EDUCATION:  Education details: initiated HEP, Educated on proper sleeping posture and lumbar support, Educated on appropriate exercises and those to avoid to reduce stress to the lumbar spine Person educated: Patient Education method: Programmer, Multimedia, Facilities Manager, Verbal cues, and Handouts Education comprehension: verbalized understanding, returned demonstration, verbal cues required, and tactile cues required  HOME EXERCISE PROGRAM: Access Code: [email or web address] URL: [email or web address] Date: 04/17/2024 Prepared by: Delon Haddock  Exercises - Standing Hamstring Stretch on Chair  - 1 x daily - 7 x weekly - 1 sets - 3 reps - 30 sec hold - Quadricep Stretch with Chair and Counter Support  - 1 x daily - 7 x weekly - 1 sets - 3 reps - 30 sec hold - Seated Figure 4 Piriformis Stretch  - 1 x daily - 7 x weekly - 1 sets - 3 reps - 30 sed hold - Seated Lateral Trunk Stretch on Swiss Ball  - 1 x daily - 7 x weekly - 1 sets - 10 reps - 5 sec hold - Supine Posterior Pelvic Tilt  - 1 x daily - 7 x weekly - 2 sets - 10 reps - Bent Knee Fallouts  - 1 x daily - 7 x weekly - 1 sets - 10 reps - Supine March with Posterior Pelvic Tilt  - 1 x daily - 7 x weekly - 1 sets - 20 reps - Dead Bug  - 1 x daily - 7 x weekly - 1 sets - 20 reps - Clamshell  - 1  x daily - 7 x weekly - 2 sets - 10 reps - Abdominal Press into [place]  - 1 x daily - 7 x weekly - 2 sets - 10 reps ASSESSMENT:  CLINICAL IMPRESSION: Dominique Griffin continues to progress well.  We did her standing alternate hand to knee on balance pad today.  She was able to do this with good form with no lob.  She has not returned to her Parkinsons rock steady boxing class yet but we discussed that this should be fine as long as she makes modifications if needed.   She would benefit from continuing skilled PT for flexibility and core strength.     OBJECTIVE IMPAIRMENTS: difficulty walking, decreased ROM, decreased strength, increased fascial restrictions, increased muscle spasms, impaired  flexibility, improper body mechanics, postural dysfunction, and pain.   ACTIVITY LIMITATIONS: carrying, lifting, bending, standing, squatting, sleeping, stairs, transfers, bed mobility, bathing, toileting, dressing, and caring for others  PARTICIPATION LIMITATIONS: meal prep, cleaning, laundry, driving, shopping, community activity, and yard work  PERSONAL FACTORS: Fitness and 1-2 comorbidities: Parkinsons and Gilberts syndrome are also affecting patient's functional outcome.   REHAB POTENTIAL: Good  CLINICAL DECISION MAKING: Evolving/moderate complexity  EVALUATION COMPLEXITY: Moderate   GOALS: Goals reviewed with patient? Yes  SHORT TERM GOALS: Target date: 04/20/2024  Pain report to be no greater than 4/10  Baseline: Goal status: MET 04/15/24  2.  Patient will be independent with initial HEP  Baseline:  Goal status: MET 04/17/24  3.  Patient to report 30% or better improvement in overall function Baseline:  Goal status: MET 04/29/24   LONG TERM GOALS: Target date: 05/18/2024  Patient to report pain no greater than 2/10  Baseline:  Goal status: MET 04/29/24  2.  Patient to be independent with advanced HEP  Baseline:  Goal status: MET 04/29/24  3.  Patients Oswestry score to improve by 3-5  points Baseline:  Goal status: In Progress  4.  Functional scores to improve by 2-3 seconds Baseline:  Goal status: MET 04/28/24  5.  Patient to be able to sleep through the night  Baseline:  Goal status: MET 05/05/24  6.  Patient to report 85% improvement in overall symptoms Baseline:  Goal status: In Progress  PLAN:  PT FREQUENCY: 1-2x/week  PT DURATION: 8 weeks  PLANNED INTERVENTIONS: 97110-Therapeutic exercises, 97530- Therapeutic activity, [id number]- Neuromuscular re-education, 97535- Self Care, [id number]- Manual therapy, [phone]- Gait training, [phone]- Canalith repositioning, [id number]- Aquatic Therapy, [phone]- Electrical stimulation (unattended), [phone]- Electrical stimulation (manual), [id number]- Ultrasound, [id number]- Traction (mechanical), [id number]- Ionotophoresis 4mg /ml Dexamethasone , Patient/Family education, Balance training, Stair training, Taping, Joint mobilization, Spinal mobilization, Vestibular training, DME instructions, Cryotherapy, and Moist heat.  PLAN FOR NEXT SESSION: Nustep, continue Hip hiking and stability, continue LE flexibility, progress core strengthening, correcting trunk shift,  continue TA activation; hinging technique   Timmi Devora B. Jaaron Oleson, PT 05/05/24 12:33 PM [facility] [address] [place], [place] [address] Phone # [phone] Fax [phone]

## 2024-05-06 ENCOUNTER — Ambulatory Visit

## 2024-05-06 DIAGNOSIS — R293 Abnormal posture: Secondary | ICD-10-CM

## 2024-05-06 DIAGNOSIS — R262 Difficulty in walking, not elsewhere classified: Secondary | ICD-10-CM

## 2024-05-06 DIAGNOSIS — M5459 Other low back pain: Secondary | ICD-10-CM

## 2024-05-06 DIAGNOSIS — R252 Cramp and spasm: Secondary | ICD-10-CM | POA: Diagnosis not present

## 2024-05-06 DIAGNOSIS — M6281 Muscle weakness (generalized): Secondary | ICD-10-CM | POA: Diagnosis not present

## 2024-05-06 NOTE — Therapy (Signed)
 OUTPATIENT PHYSICAL THERAPY THORACOLUMBAR TREATMENT Progress Note Reporting Period 03/23/24 to 04/28/24  See note below for Objective Data and Assessment of Progress/Goals.       Patient Name: Dominique Griffin MRN: [id number] DOB:April 28, 1951, 73 y.o., female Today's Date: 05/06/2024  END OF SESSION:  PT End of Session - 05/06/24 1242     Visit Number 13    Number of Visits 16    Date for Recertification  05/18/24    Authorization Type [employer] Approved 16 visits-03/23/24-06/21/2024-auth#[id number]    Authorization Time Period 03/23/24-06/21/2024    Authorization - Visit Number 13    Authorization - Number of Visits 16    Progress Note Due on Visit 20    PT Start Time 1230    PT Stop Time 1315    PT Time Calculation (min) 45 min    Activity Tolerance Patient tolerated treatment well    Behavior During Therapy WFL for tasks assessed/performed            Past Medical History:  Diagnosis Date   Gilbert's syndrome    History of ITP    Osteopenia    Parkinson disease (HCC)    Past Surgical History:  Procedure Laterality Date   ABDOMINAL HYSTERECTOMY  1993   fibroids, ovaries remain   BREAST EXCISIONAL BIOPSY Left    skin bbiopsy   TONSILLECTOMY     Patient Active Problem List   Diagnosis Date Noted   Lumbar back pain with radiculopathy affecting left lower extremity 03/10/2024   Class 1 obesity due to excess calories without serious comorbidity with body mass index (BMI) of 30.0 to 30.9 in adult 12/15/2023   Gallstones 02/25/2023   B12 deficiency 01/07/2023   Dizziness - PPPD 10/24/2022   Chronic ITP (idiopathic thrombocytopenia) (HCC) 09/07/2022   Clinical trial exam 06/27/2021   Osteopenia 11/13/2020   Vitamin D deficiency 11/13/2020   Diverticulosis of colon 11/13/2020   Family history of colon cancer 11/13/2020   Arthritis 09/27/2020   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  Still just a little ache in the left low    From Eval: Back pain since she was in her 40's. In her 50's fell from a ladder.  In her 66's, a lot of OA found.  In her 44's, diagnosed with Parkinsons.  Now very active doing [employer], multiple Parkinsons classes, Tai Chi, multiple stretching classes to try and deal with the parkinsons symptoms.  She was doing happy baby and felt pain in the low back flare up.  She was prescribed dose pack and other meds.  She hopes to eliminate the back pain so that she can continue working on controlling her Parkinson's symptoms.    PERTINENT HISTORY:  Parkinson's    PAIN:  05/06/24 Are you having pain? Yes: NPRS scale: 2-4/10 Pain location: Left lower back, left lateral LE, occasional left lateral knee Pain description: aching Aggravating factors: standing Relieving factors: exercise, meds  PRECAUTIONS: None  RED FLAGS: None   WEIGHT BEARING RESTRICTIONS: No  FALLS:  Has patient fallen in last 6 months? No  LIVING ENVIRONMENT: Lives with: lives with their family Lives in: House/apartment Stairs: Yes: Internal: 17 steps; on right going up and External: 5 steps; on right going up Has following equipment at home: None  OCCUPATION: retired   PLOF: Independent,  Independent with basic ADLs, Independent with household mobility without device, Independent with community mobility without device, Independent with homemaking with ambulation, Independent with gait, and Independent with transfers  PATIENT GOALS: To be able to continue to exercise to reduce her Parkinson's symptoms  NEXT MD VISIT: prn  OBJECTIVE:  Note: Objective measures were completed at Evaluation unless otherwise noted.  DIAGNOSTIC FINDINGS:  na  PATIENT SURVEYS:  Modified Oswestry:  MODIFIED OSWESTRY DISABILITY SCALE  Date: 03/23/24 Score  Total 14/50   Interpretation of scores: Score Category Description  0-20% Minimal Disability The patient can cope with most living activities. Usually no treatment is indicated apart from advice on lifting, sitting and exercise  21-40% Moderate Disability The patient experiences more pain and difficulty with sitting, lifting and standing. Travel and social life are more difficult and they may be disabled from work. Personal care, sexual activity and sleeping are not grossly affected, and the patient can usually be managed by conservative means  41-60% Severe Disability Pain remains the main problem in this group, but activities of daily living are affected. These patients require a detailed investigation  61-80% Crippled Back pain impinges on all aspects of the patient's life. Positive intervention is required  81-100% Bed-bound  These patients are either bed-bound or exaggerating their symptoms  [name], et al. Surgery versus conservative management of stable thoracolumbar fracture: the PRESTO feasibility RCT. [place] ([place]): [employer]; 2021 Nov. [facility] Technology Assessment, No. 25.62.) Appendix 3, Oswestry Disability Index category descriptors. Available from: Findjewelers.cz  Minimally Clinically Important Difference (MCID) = 12.8%  COGNITION: Overall cognitive status: Within  functional limits for tasks assessed     SENSATION: Occasional pain down the left outer thigh and into the knee but appears to be referred vs radicular  MUSCLE LENGTH: Hamstrings: Right 55 deg; Left 50 deg Thomas test: Right pos; Left pos  POSTURE: Scoliosis obvious, right trunk shift, even more pronounced lying down  PALPATION: Tender left IT band  LUMBAR ROM:   AROM eval 04/28/24  Flexion WNL   Extension 50%   Right lateral flexion WNL   Left lateral flexion Fingertips  to just above joint line   Right rotation WNL   Left rotation WNL    (Blank rows = not tested)  LOWER EXTREMITY ROM:     WFL  LOWER EXTREMITY MMT:    Initial eval: Generally 4 to 4+/5 with exception of hip extension 3+ bilaterally, hip abduction 4- right hip, 3+ left hip  04/28/24: Improved to 4+/5 on all   LUMBAR SPECIAL TESTS:  Straight leg raise test: Negative, FABER test: Negative, and Thomas test: Positive  FUNCTIONAL TESTS:  Initial eval: 5 times sit to stand: 10.74 sec Timed up and go (TUG): 9.40 sec  04/28/24: 5 times sit to stand: 7.29 sec Timed up and go (TUG): 7.05 sec  GAIT: Distance walked: 30 feet Assistive device utilized: None Level of assistance: Modified independence Comments: fairly normal heel to toe progression  TREATMENT DATE:  05/05/2024 Nustep x 6 min level 5 Supine IT band stretch on left x 10 hold 10 sec Supine heel grabs 2 x 20 Supine PPT 2 x 10 Supine PPT + march 2 x 20 bilateral vc's for maintaining tilt (unsupported) Supine PPT with dying bug unsupported x 20 Supine PPT with hand to opposite knee 2 x 10 each side with 4lbs (unsupported) Supine PPT with clamshell using black loop x 20 Supine PPT  bridge and clamshell with black loop combo 2 x 10 Tail wags 2 x 20 (verbal cues to reach when going to left) Sit to stand x 10 holding 10 lb kb Squat to table x 10 holding 10 lb kb [place] x 10 each way with 10 lb kb standing on balance pad Seated  mini sit ups 2 x 10 with 10 lb kb  Seated modified Russian twist with 10 lb kb x 20 (10 each side) Seated shoulder to hip in reclined position with 10lb kb  Seated opposite arm to opposite knee with 3 lbs x 20  Standing opposite arm to opposite knee with 3 lbs x 20 on balance pad Peanut roll stretch x 10 to each side holding 5 sec each Palloff press x 10 each direction with 10 lbs at matrix standing  on balance pad.    05/05/2024 Nustep x 7 min level 5 Tail wags 2 x 20 Supine heel grabs 2 x 20 Supine PPT 2 x 10 Supine PPT + march 2 x 20 bilateral vc's for maintaining tilt (did unsupported today) Supine PPT with dying bug unsupported x 20 Supine PPT with hand to opposite knee 2 x 10 each side with 3lbs (unsupported) Supine PPT with clamshell using black loop x 20 Supine PPT  bridge and clamshell with black loop combo 2 x 10 Sit to stand x 10 holding 10 lb kb Squat to table x 10 holding 10 lb kb Seated mini sit ups 2 x 10 with 10 lb kb  Seated modified Russian twist with 10 lb kb x 20 (10 each side) Seated shoulder to hip in reclined position with 10lb kb  [place] x 10 each way with 10 lb kb standing on balance pad Seated opposite arm to opposite knee with 3 lbs x 20  Standing opposite arm to opposite knee with 3 lbs x 20 on balance pad Peanut roll stretch x 10 to each side holding 5 sec each Palloff press x 10 each direction with 10 lbs at matrix standing  on balance pad.  Declined ice  04/29/2024 Nustep x 7 min level 5 10th visit re-assessment: see below Hooklying heel reaches 2  x 20 Supine PPT 2 x 10 Supine PPT + march x 20 bilateral vc's for maintaining tilt (did unsupported today) Supine PPT with dying bug unsupported x 20 Supine PPT with hand to opposite knee 2 x 10 each side with 3lbs (unsupported) Supine PPT with clamshell using black loop x 20 Supine PPT  bridge and clamshell with black loop combo 2 x 10 Sit to stand x 10 holding 10 lb kb Squat to table x 10  holding 10 lb kb Seated mini sit ups 2 x 10 with 10 lb kb  Seated modified Russian twist with 10 lb kb x 20 (10 each side) Seated shoulder to hip in reclined position with 10lb kb  [place] x 10 each way with 10 lb kb standing on balance pad Seated opposite arm to opposite knee with 3 lbs x 20 Standing opposite arm to opposite knee with 3 lbs x 20 Peanut roll stretch x 10 to each side holding 5 sec each Palloff press x 10 each direction with 10 lbs at matrix standing  on balance pad.  Ice x 10 min to lumbar spine in hook lying  04/28/2024 Nustep x 7 min level 5 10th visit re-assessment: see below Hooklying heel reaches 2 x 20 Supine PPT 2 x 10 Supine PPT + march x 20 bilateral vc's for maintaining tilt (did unsupported today) Supine PPT with dying bug unsupported x 20 Supine PPT with hand to opposite knee 2 x 10 each side with 3lbs (unsupported) Supine PPT with clamshell using black loop x 20 Supine PPT  bridge and clamshell with black loop combo 2 x 10 Sit to stand x 10 holding 2 x 3 lb dumbbell Squat to table x 10 holding 2 x 3 lb dumbbell Seated mini sit ups 2 x 10 with 2 x 3lb kb (vc's to maintain neutral pelvis) Seated modified Russian twist with 2 x 3 lbs x 20 (10 each side) Seated shoulder to hip in reclined position with 5lb kb  Peanut roll stretch x 10 to each side holding 5 sec each Palloff press x 10 each direction with 10 lbs at matrix Around the [place] x 10 each way with 5 lb kb standing on balance pad Seated opposite arm to opposite knee with 3 lbs x 20 Standing opposite arm to opposite knee with 3 lbs x 20   PATIENT EDUCATION:  Education details: initiated HEP, Educated on proper sleeping posture and lumbar support, Educated on appropriate exercises and those to avoid to reduce stress to the lumbar spine Person educated: Patient Education method: Programmer, Multimedia, Facilities Manager, Verbal cues, and Handouts Education comprehension: verbalized understanding,  returned demonstration, verbal cues required, and tactile cues required  HOME EXERCISE PROGRAM: Access Code: [email or web address] URL: [email or web address] Date: 04/17/2024 Prepared by: Delon Haddock  Exercises - Standing Hamstring Stretch on Chair  - 1 x daily - 7 x weekly - 1 sets - 3 reps - 30 sec hold - Quadricep Stretch with Chair and Counter Support  - 1 x daily - 7 x weekly - 1 sets - 3 reps - 30 sec hold - Seated Figure 4 Piriformis Stretch  - 1 x daily - 7 x weekly - 1 sets - 3 reps - 30 sed hold - Seated Lateral Trunk Stretch on Swiss Ball  - 1 x daily - 7 x weekly - 1 sets - 10 reps - 5 sec hold - Supine Posterior Pelvic Tilt  - 1 x daily - 7 x weekly - 2 sets -  10 reps - Bent Knee Fallouts  - 1 x daily - 7 x weekly - 1 sets - 10 reps - Supine March with Posterior Pelvic Tilt  - 1 x daily - 7 x weekly - 1 sets - 20 reps - Dead Bug  - 1 x daily - 7 x weekly - 1 sets - 20 reps - Clamshell  - 1 x daily - 7 x weekly - 2 sets - 10 reps - Abdominal Press into [place]  - 1 x daily - 7 x weekly - 2 sets - 10 reps ASSESSMENT:  CLINICAL IMPRESSION: Baylyn continues to progress well. We added IT band stretch on left side due to the continued left low back and left lateral thigh discomfort. She reported and good stretch with these.  She is doing great with all modifications to her program.   She would benefit from continuing skilled PT for flexibility and core strength.     OBJECTIVE IMPAIRMENTS: difficulty walking, decreased ROM, decreased strength, increased fascial restrictions, increased muscle spasms, impaired flexibility, improper body mechanics, postural dysfunction, and pain.   ACTIVITY LIMITATIONS: carrying, lifting, bending, standing, squatting, sleeping, stairs, transfers, bed mobility, bathing, toileting, dressing, and caring for others  PARTICIPATION LIMITATIONS: meal prep, cleaning, laundry, driving, shopping, community activity, and yard work  PERSONAL FACTORS: Fitness  and 1-2 comorbidities: Parkinsons and Gilberts syndrome are also affecting patient's functional outcome.   REHAB POTENTIAL: Good  CLINICAL DECISION MAKING: Evolving/moderate complexity  EVALUATION COMPLEXITY: Moderate   GOALS: Goals reviewed with patient? Yes  SHORT TERM GOALS: Target date: 04/20/2024  Pain report to be no greater than 4/10  Baseline: Goal status: MET 04/15/24  2.  Patient will be independent with initial HEP  Baseline:  Goal status: MET 04/17/24  3.  Patient to report 30% or better improvement in overall function Baseline:  Goal status: MET 04/29/24   LONG TERM GOALS: Target date: 05/18/2024  Patient to report pain no greater than 2/10  Baseline:  Goal status: MET 04/29/24  2.  Patient to be independent with advanced HEP  Baseline:  Goal status: MET 04/29/24  3.  Patients Oswestry score to improve by 3-5 points Baseline:  Goal status: In Progress  4.  Functional scores to improve by 2-3 seconds Baseline:  Goal status: MET 04/28/24  5.  Patient to be able to sleep through the night  Baseline:  Goal status: MET 05/05/24  6.  Patient to report 85% improvement in overall symptoms Baseline:  Goal status: In Progress  PLAN:  PT FREQUENCY: 1-2x/week  PT DURATION: 8 weeks  PLANNED INTERVENTIONS: 97110-Therapeutic exercises, 97530- Therapeutic activity, [id number]- Neuromuscular re-education, 97535- Self Care, [id number]- Manual therapy, [phone]- Gait training, [phone]- Canalith repositioning, [id number]- Aquatic Therapy, [phone]- Electrical stimulation (unattended), [phone]- Electrical stimulation (manual), [id number]- Ultrasound, [id number]- Traction (mechanical), [id number]- Ionotophoresis 4mg /ml Dexamethasone , Patient/Family education, Balance training, Stair training, Taping, Joint mobilization, Spinal mobilization, Vestibular training, DME instructions, Cryotherapy, and Moist heat.  PLAN FOR NEXT SESSION: Nustep, Hip hiking and stability, tail wags, continue LE flexibility,  progress core strengthening, correcting trunk shift,  continue TA activation; hinging technique   Daylon Lafavor B. Bethaney Oshana, PT 05/06/24 1:19 PM [facility] [address] [place], [place] [address] Phone # [phone] Fax [phone]

## 2024-05-08 ENCOUNTER — Encounter

## 2024-05-12 ENCOUNTER — Encounter

## 2024-05-12 ENCOUNTER — Ambulatory Visit: Attending: Internal Medicine

## 2024-05-12 DIAGNOSIS — M6281 Muscle weakness (generalized): Secondary | ICD-10-CM | POA: Insufficient documentation

## 2024-05-12 DIAGNOSIS — M5459 Other low back pain: Secondary | ICD-10-CM | POA: Diagnosis not present

## 2024-05-12 DIAGNOSIS — R293 Abnormal posture: Secondary | ICD-10-CM | POA: Diagnosis not present

## 2024-05-12 DIAGNOSIS — R252 Cramp and spasm: Secondary | ICD-10-CM | POA: Insufficient documentation

## 2024-05-12 DIAGNOSIS — R262 Difficulty in walking, not elsewhere classified: Secondary | ICD-10-CM | POA: Diagnosis not present

## 2024-05-12 NOTE — Therapy (Signed)
 OUTPATIENT PHYSICAL THERAPY THORACOLUMBAR TREATMENT  Patient Name: Dominique Griffin MRN: [id number] DOB:1950-10-25, 73 y.o., female Today's Date: 05/12/2024  END OF SESSION:  PT End of Session - 05/12/24 1105     Visit Number 14    Number of Visits 16    Date for Recertification  05/18/24    Authorization Type [employer] Approved 16 visits-03/23/24-06/21/2024-auth#[id number]    Authorization Time Period 03/23/24-06/21/2024    Authorization - Visit Number 14    Authorization - Number of Visits 16    Progress Note Due on Visit 20    PT Start Time 1105    PT Stop Time 1145    PT Time Calculation (min) 40 min    Activity Tolerance Patient tolerated treatment well    Behavior During Therapy WFL for tasks assessed/performed            Past Medical History:  Diagnosis Date   Gilbert's syndrome    History of ITP    Osteopenia    Parkinson disease (HCC)    Past Surgical History:  Procedure Laterality Date   ABDOMINAL HYSTERECTOMY  1993   fibroids, ovaries remain   BREAST EXCISIONAL BIOPSY Left    skin bbiopsy   TONSILLECTOMY     Patient Active Problem List   Diagnosis Date Noted   Lumbar back pain with radiculopathy affecting left lower extremity 03/10/2024   Class 1 obesity due to excess calories without serious comorbidity with body mass index (BMI) of 30.0 to 30.9 in adult 12/15/2023   Gallstones 02/25/2023   B12 deficiency 01/07/2023   Dizziness - PPPD 10/24/2022   Chronic ITP (idiopathic thrombocytopenia) (HCC) 09/07/2022   Clinical trial exam 06/27/2021   Osteopenia 11/13/2020   Vitamin D deficiency 11/13/2020   Diverticulosis of colon 11/13/2020   Family history of colon cancer 11/13/2020   Arthritis 09/27/2020   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   Coccyx pain 09/27/2020   Parkinson's disease - Dr Evonnie 12/02/2019    PCP: Geofm Glade PARAS, MD  REFERRING PROVIDER: Geofm Glade PARAS, MD  REFERRING DIAG: M54.9 (ICD-10-CM) - Back pain, unspecified back location, unspecified  back pain laterality, unspecified chronicity  Rationale for Evaluation and Treatment: Rehabilitation  THERAPY DIAG:  Other low back pain  Muscle weakness (generalized)  Abnormal posture  Cramp and spasm  Difficulty in walking, not elsewhere classified  ONSET DATE: 03/19/2024  SUBJECTIVE:                                                                                                                                                                                           SUBJECTIVE STATEMENT: Patient reports doing ok, I still  have a little pain in the left IT band at the lower part  Patient expresses interest in working on balance.    From Eval: Back pain since she was in her 40's. In her 50's fell from a ladder.  In her 39's, a lot of OA found.  In her 62's, diagnosed with Parkinsons.  Now very active doing [employer], multiple Parkinsons classes, Tai Chi, multiple stretching classes to try and deal with the parkinsons symptoms.  She was doing happy baby and felt pain in the low back flare up.  She was prescribed dose pack and other meds.  She hopes to eliminate the back pain so that she can continue working on controlling her Parkinson's symptoms.    PERTINENT HISTORY:  Parkinson's    PAIN:  05/12/24 Are you having pain? Yes: NPRS scale: 2-3/10 Pain location: Left lower back, left lateral LE, occasional left lateral knee Pain description: aching Aggravating factors: standing Relieving factors: exercise, meds  PRECAUTIONS: None  RED FLAGS: None   WEIGHT BEARING RESTRICTIONS: No  FALLS:  Has patient fallen in last 6 months? No  LIVING ENVIRONMENT: Lives with: lives with their family Lives in: House/apartment Stairs: Yes: Internal: 17 steps; on right going up and External: 5 steps; on right going up Has following equipment at home: None  OCCUPATION: retired   PLOF: Independent, Independent with basic ADLs, Independent with household mobility without device,  Independent with community mobility without device, Independent with homemaking with ambulation, Independent with gait, and Independent with transfers  PATIENT GOALS: To be able to continue to exercise to reduce her Parkinson's symptoms  NEXT MD VISIT: prn  OBJECTIVE:  Note: Objective measures were completed at Evaluation unless otherwise noted.  DIAGNOSTIC FINDINGS:  na  PATIENT SURVEYS:  Modified Oswestry:  MODIFIED OSWESTRY DISABILITY SCALE  Date: 03/23/24 Score  Total 14/50   Interpretation of scores: Score Category Description  0-20% Minimal Disability The patient can cope with most living activities. Usually no treatment is indicated apart from advice on lifting, sitting and exercise  21-40% Moderate Disability The patient experiences more pain and difficulty with sitting, lifting and standing. Travel and social life are more difficult and they may be disabled from work. Personal care, sexual activity and sleeping are not grossly affected, and the patient can usually be managed by conservative means  41-60% Severe Disability Pain remains the main problem in this group, but activities of daily living are affected. These patients require a detailed investigation  61-80% Crippled Back pain impinges on all aspects of the patient's life. Positive intervention is required  81-100% Bed-bound  These patients are either bed-bound or exaggerating their symptoms  [name], et al. Surgery versus conservative management of stable thoracolumbar fracture: the PRESTO feasibility RCT. [place] ([place]): [employer]; 2021 Nov. [facility] Technology Assessment, No. 25.62.) Appendix 3, Oswestry Disability Index category descriptors. Available from: [email or web address].cz  Minimally Clinically Important Difference (MCID) = 12.8%  COGNITION: Overall cognitive status: Within functional limits for tasks assessed     SENSATION: Occasional pain down the  left outer thigh and into the knee but appears to be referred vs radicular  MUSCLE LENGTH: Hamstrings: Right 55 deg; Left 50 deg Thomas test: Right pos; Left pos  POSTURE: Scoliosis obvious, right trunk shift, even more pronounced lying down  PALPATION: Tender left IT band  LUMBAR ROM:   AROM eval 04/28/24  Flexion WNL   Extension 50%   Right lateral flexion WNL   Left lateral  flexion Fingertips to just above joint line   Right rotation WNL   Left rotation WNL    (Blank rows = not tested)  LOWER EXTREMITY ROM:     WFL  LOWER EXTREMITY MMT:    Initial eval: Generally 4 to 4+/5 with exception of hip extension 3+ bilaterally, hip abduction 4- right hip, 3+ left hip  04/28/24: Improved to 4+/5 on all   LUMBAR SPECIAL TESTS:  Straight leg raise test: Negative, FABER test: Negative, and Thomas test: Positive  FUNCTIONAL TESTS:  Initial eval: 5 times sit to stand: 10.74 sec Timed up and go (TUG): 9.40 sec  04/28/24: 5 times sit to stand: 7.29 sec Timed up and go (TUG): 7.05 sec  GAIT: Distance walked: 30 feet Assistive device utilized: None Level of assistance: Modified independence Comments: fairly normal heel to toe progression  TREATMENT DATE:  05/12/2024 Nustep x 6 min level 5 Left IT band rolling x 1 min  Supine IT band stretch on left x 5 hold 10 sec Supine heel grabs 2 x 20 Supine PPT 2 x 10 Supine PPT with dying bug unsupported x 20 Supine PPT + march 2 x 20 bilateral vc's for maintaining tilt (unsupported) Supine PPT with hand to opposite knee 2 x 10 each side with 4lbs (unsupported) Supine PPT with clamshell using black loop x 20 Supine PPT  bridge and clamshell with black loop combo 2 x 10 Tail wags 2 x 20 (verbal cues to reach when going to left) Sit to stand x 10 holding 10 lb kb Squat to table x 10 holding 10 lb kb Step up and hold fwd and lateral x 10 each LE in each direction on balance pad  Around the worlds x 10 each way with 10 lb kb  standing on balance pad Standing in tandem on balance pad : locomotives with 2 lb dumbbells Lunge to BOSU  x 10 each LE fwd, then x 10 each LE lateral Seated mini sit ups 2 x 10 with 10 lb kb  Seated modified Russian twist with 10 lb kb x 20 (10 each side) Seated shoulder to hip in reclined position with 10lb kb  Peanut roll stretch x 10 to each side holding 5 sec each Palloff press x 10 each direction with 10 lbs at matrix standing  on balance pad.   05/05/2024 Nustep x 6 min level 5 Supine IT band stretch on left x 10 hold 10 sec Supine heel grabs 2 x 20 Supine PPT 2 x 10 Supine PPT + march 2 x 20 bilateral vc's for maintaining tilt (unsupported) Supine PPT with dying bug unsupported x 20 Supine PPT with hand to opposite knee 2 x 10 each side with 4lbs (unsupported) Supine PPT with clamshell using black loop x 20 Supine PPT  bridge and clamshell with black loop combo 2 x 10 Tail wags 2 x 20 (verbal cues to reach when going to left) Sit to stand x 10 holding 10 lb kb Squat to table x 10 holding 10 lb kb Around the [place] x 10 each way with 10 lb kb standing on balance pad Seated mini sit ups 2 x 10 with 10 lb kb  Seated modified Russian twist with 10 lb kb x 20 (10 each side) Seated shoulder to hip in reclined position with 10lb kb  Seated opposite arm to opposite knee with 3 lbs x 20  Standing opposite arm to opposite knee with 3 lbs x 20 on balance pad Peanut roll stretch  x 10 to each side holding 5 sec each Palloff press x 10 each direction with 10 lbs at matrix standing  on balance pad.    05/05/2024 Nustep x 7 min level 5 Tail wags 2 x 20 Supine heel grabs 2 x 20 Supine PPT 2 x 10 Supine PPT + march 2 x 20 bilateral vc's for maintaining tilt (did unsupported today) Supine PPT with dying bug unsupported x 20 Supine PPT with hand to opposite knee 2 x 10 each side with 3lbs (unsupported) Supine PPT with clamshell using black loop x 20 Supine PPT  bridge and clamshell with  black loop combo 2 x 10 Sit to stand x 10 holding 10 lb kb Squat to table x 10 holding 10 lb kb Seated mini sit ups 2 x 10 with 10 lb kb  Seated modified Russian twist with 10 lb kb x 20 (10 each side) Seated shoulder to hip in reclined position with 10lb kb  Around the worlds x 10 each way with 10 lb kb standing on balance pad Seated opposite arm to opposite knee with 3 lbs x 20  Standing opposite arm to opposite knee with 3 lbs x 20 on balance pad Peanut roll stretch x 10 to each side holding 5 sec each Palloff press x 10 each direction with 10 lbs at matrix standing  on balance pad.  Declined ice  04/29/2024 Nustep x 7 min level 5 10th visit re-assessment: see below Hooklying heel reaches 2 x 20 Supine PPT 2 x 10 Supine PPT + march x 20 bilateral vc's for maintaining tilt (did unsupported today) Supine PPT with dying bug unsupported x 20 Supine PPT with hand to opposite knee 2 x 10 each side with 3lbs (unsupported) Supine PPT with clamshell using black loop x 20 Supine PPT  bridge and clamshell with black loop combo 2 x 10 Sit to stand x 10 holding 10 lb kb Squat to table x 10 holding 10 lb kb Seated mini sit ups 2 x 10 with 10 lb kb  Seated modified Russian twist with 10 lb kb x 20 (10 each side) Seated shoulder to hip in reclined position with 10lb kb  Around the worlds x 10 each way with 10 lb kb standing on balance pad Seated opposite arm to opposite knee with 3 lbs x 20 Standing opposite arm to opposite knee with 3 lbs x 20 Peanut roll stretch x 10 to each side holding 5 sec each Palloff press x 10 each direction with 10 lbs at matrix standing  on balance pad.  Ice x 10 min to lumbar spine in hook lying  04/28/2024 Nustep x 7 min level 5 10th visit re-assessment: see below Hooklying heel reaches 2 x 20 Supine PPT 2 x 10 Supine PPT + march x 20 bilateral vc's for maintaining tilt (did unsupported today) Supine PPT with dying bug unsupported x 20 Supine PPT with hand  to opposite knee 2 x 10 each side with 3lbs (unsupported) Supine PPT with clamshell using black loop x 20 Supine PPT  bridge and clamshell with black loop combo 2 x 10 Sit to stand x 10 holding 2 x 3 lb dumbbell Squat to table x 10 holding 2 x 3 lb dumbbell Seated mini sit ups 2 x 10 with 2 x 3lb kb (vc's to maintain neutral pelvis) Seated modified Russian twist with 2 x 3 lbs x 20 (10 each side) Seated shoulder to hip in reclined position with 5lb kb  Peanut  roll stretch x 10 to each side holding 5 sec each Palloff press x 10 each direction with 10 lbs at matrix Around the worlds x 10 each way with 5 lb kb standing on balance pad Seated opposite arm to opposite knee with 3 lbs x 20 Standing opposite arm to opposite knee with 3 lbs x 20   PATIENT EDUCATION:  Education details: initiated HEP, Educated on proper sleeping posture and lumbar support, Educated on appropriate exercises and those to avoid to reduce stress to the lumbar spine Person educated: Patient Education method: Programmer, Multimedia, Demonstration, Verbal cues, and Handouts Education comprehension: verbalized understanding, returned demonstration, verbal cues required, and tactile cues required  HOME EXERCISE PROGRAM: Access Code: [email or web address] URL: [email or web address] Date: 04/17/2024 Prepared by: Delon Haddock  Exercises - Standing Hamstring Stretch on Chair  - 1 x daily - 7 x weekly - 1 sets - 3 reps - 30 sec hold - Quadricep Stretch with Chair and Counter Support  - 1 x daily - 7 x weekly - 1 sets - 3 reps - 30 sec hold - Seated Figure 4 Piriformis Stretch  - 1 x daily - 7 x weekly - 1 sets - 3 reps - 30 sed hold - Seated Lateral Trunk Stretch on Swiss Ball  - 1 x daily - 7 x weekly - 1 sets - 10 reps - 5 sec hold - Supine Posterior Pelvic Tilt  - 1 x daily - 7 x weekly - 2 sets - 10 reps - Bent Knee Fallouts  - 1 x daily - 7 x weekly - 1 sets - 10 reps - Supine March with Posterior Pelvic Tilt  - 1 x daily -  7 x weekly - 1 sets - 20 reps - Dead Bug  - 1 x daily - 7 x weekly - 1 sets - 20 reps - Clamshell  - 1 x daily - 7 x weekly - 2 sets - 10 reps - Abdominal Press into Energy  - 1 x daily - 7 x weekly - 2 sets - 10 reps ASSESSMENT:  CLINICAL IMPRESSION: Jaelynne continues to progress well. We added IT band stretch on left side due to the continued left low back and left lateral thigh discomfort. She reported and good stretch with these.  She is doing great with all modifications to her program.   She would benefit from continuing skilled PT for flexibility and core strength.     OBJECTIVE IMPAIRMENTS: difficulty walking, decreased ROM, decreased strength, increased fascial restrictions, increased muscle spasms, impaired flexibility, improper body mechanics, postural dysfunction, and pain.   ACTIVITY LIMITATIONS: carrying, lifting, bending, standing, squatting, sleeping, stairs, transfers, bed mobility, bathing, toileting, dressing, and caring for others  PARTICIPATION LIMITATIONS: meal prep, cleaning, laundry, driving, shopping, community activity, and yard work  PERSONAL FACTORS: Fitness and 1-2 comorbidities: Parkinsons and Gilberts syndrome are also affecting patient's functional outcome.   REHAB POTENTIAL: Good  CLINICAL DECISION MAKING: Evolving/moderate complexity  EVALUATION COMPLEXITY: Moderate   GOALS: Goals reviewed with patient? Yes  SHORT TERM GOALS: Target date: 04/20/2024  Pain report to be no greater than 4/10  Baseline: Goal status: MET 04/15/24  2.  Patient will be independent with initial HEP  Baseline:  Goal status: MET 04/17/24  3.  Patient to report 30% or better improvement in overall function Baseline:  Goal status: MET 04/29/24   LONG TERM GOALS: Target date: 05/18/2024  Patient to report pain no greater than 2/10  Baseline:  Goal status: MET 04/29/24  2.  Patient to be independent with advanced HEP  Baseline:  Goal status: MET 04/29/24  3.   Patients Oswestry score to improve by 3-5 points Baseline:  Goal status: In Progress  4.  Functional scores to improve by 2-3 seconds Baseline:  Goal status: MET 04/28/24  5.  Patient to be able to sleep through the night  Baseline:  Goal status: MET 05/05/24  6.  Patient to report 85% improvement in overall symptoms Baseline:  Goal status: In Progress  PLAN:  PT FREQUENCY: 1-2x/week  PT DURATION: 8 weeks  PLANNED INTERVENTIONS: 97110-Therapeutic exercises, 97530- Therapeutic activity, [id number]- Neuromuscular re-education, 97535- Self Care, [id number]- Manual therapy, [id number]- Gait training, [phone]- Canalith repositioning, [id number]- Aquatic Therapy, [id number]- Electrical stimulation (unattended), [id number]- Electrical stimulation (manual), [id number]- Ultrasound, [id number]- Traction (mechanical), [id number]- Ionotophoresis 4mg /ml Dexamethasone , Patient/Family education, Balance training, Stair training, Taping, Joint mobilization, Spinal mobilization, Vestibular training, DME instructions, Cryotherapy, and Moist heat.  PLAN FOR NEXT SESSION: Nustep, Hip hiking and stability, tail wags, continue LE flexibility, progress core strengthening, correcting trunk shift,  continue TA activation; hinging technique   Olanda Downie B. Willett Lefeber, PT 05/12/24 9:00 PM [facility] [address], [address] [place], [place] [address] Phone # [phone] Fax [phone]

## 2024-05-15 ENCOUNTER — Ambulatory Visit

## 2024-05-19 ENCOUNTER — Ambulatory Visit

## 2024-05-19 DIAGNOSIS — R293 Abnormal posture: Secondary | ICD-10-CM | POA: Diagnosis not present

## 2024-05-19 DIAGNOSIS — R252 Cramp and spasm: Secondary | ICD-10-CM

## 2024-05-19 DIAGNOSIS — M6281 Muscle weakness (generalized): Secondary | ICD-10-CM | POA: Diagnosis not present

## 2024-05-19 DIAGNOSIS — M5459 Other low back pain: Secondary | ICD-10-CM | POA: Diagnosis not present

## 2024-05-19 DIAGNOSIS — R262 Difficulty in walking, not elsewhere classified: Secondary | ICD-10-CM | POA: Diagnosis not present

## 2024-05-19 NOTE — Therapy (Signed)
 OUTPATIENT PHYSICAL THERAPY THORACOLUMBAR TREATMENT  Patient Name: Dominique Griffin MRN: [id number] DOB:05/22/1951, 73 y.o., female Today's Date: 05/19/2024  END OF SESSION:  PT End of Session - 05/19/24 1154     Visit Number 15    Number of Visits 16    Date for Recertification  07/13/24    Authorization Type [employer] Approved 16 visits-03/23/24-06/21/2024-auth#[id number]    Authorization Time Period 03/23/24-06/21/2024    Authorization - Visit Number 15    Authorization - Number of Visits 16    Progress Note Due on Visit 20    PT Start Time 1152    Activity Tolerance Patient tolerated treatment well    Behavior During Therapy [facility] for tasks assessed/performed            Past Medical History:  Diagnosis Date   Gilbert's syndrome    History of ITP    Osteopenia    Parkinson disease (HCC)    Past Surgical History:  Procedure Laterality Date   ABDOMINAL HYSTERECTOMY  1993   fibroids, ovaries remain   BREAST EXCISIONAL BIOPSY Left    skin bbiopsy   TONSILLECTOMY     Patient Active Problem List   Diagnosis Date Noted   Lumbar back pain with radiculopathy affecting left lower extremity 03/10/2024   Class 1 obesity due to excess calories without serious comorbidity with body mass index (BMI) of 30.0 to 30.9 in adult 12/15/2023   Gallstones 02/25/2023   B12 deficiency 01/07/2023   Dizziness - PPPD 10/24/2022   Chronic ITP (idiopathic thrombocytopenia) (HCC) 09/07/2022   Clinical trial exam 06/27/2021   Osteopenia 11/13/2020   Vitamin D deficiency 11/13/2020   Diverticulosis of colon 11/13/2020   Family history of colon cancer 11/13/2020   Arthritis 09/27/2020   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   Coccyx pain 09/27/2020   Parkinson's disease - Dr Evonnie 12/02/2019    PCP: Geofm Glade PARAS, MD  REFERRING PROVIDER: Geofm Glade PARAS, MD  REFERRING DIAG: M54.9 (ICD-10-CM) - Back pain, unspecified back location, unspecified back pain laterality, unspecified chronicity  Rationale for  Evaluation and Treatment: Rehabilitation  THERAPY DIAG:  Other low back pain - Plan: PT plan of care cert/re-cert  Muscle weakness (generalized) - Plan: PT plan of care cert/re-cert  Difficulty in walking, not elsewhere classified - Plan: PT plan of care cert/re-cert  Abnormal posture - Plan: PT plan of care cert/re-cert  Cramp and spasm - Plan: PT plan of care cert/re-cert  ONSET DATE: 03/19/2024  SUBJECTIVE:  SUBJECTIVE STATEMENT: Patient reports she is doing well but still having a little pain in the left low back.  She expresses that she would like to continue formal PT for a bit longer to feel more confident about returning to her Parkinson's boxing class and her yoga class.     From Eval: Back pain since she was in her 40's. In her 50's fell from a ladder.  In her 64's, a lot of OA found.  In her 25's, diagnosed with Parkinsons.  Now very active doing [employer], multiple Parkinsons classes, Tai Chi, multiple stretching classes to try and deal with the parkinsons symptoms.  She was doing happy baby and felt pain in the low back flare up.  She was prescribed dose pack and other meds.  She hopes to eliminate the back pain so that she can continue working on controlling her Parkinson's symptoms.    PERTINENT HISTORY:  Parkinson's    PAIN:  05/19/24 Are you having pain? Yes: NPRS scale: 2-3/10 Pain location: Left lower back, left lateral LE, occasional left lateral knee Pain description: aching Aggravating factors: standing Relieving factors: exercise, meds  PRECAUTIONS: None  RED FLAGS: None   WEIGHT BEARING RESTRICTIONS: No  FALLS:  Has patient fallen in last 6 months? No  LIVING ENVIRONMENT: Lives with: lives with their family Lives in: House/apartment Stairs: Yes: Internal: 17  steps; on right going up and External: 5 steps; on right going up Has following equipment at home: None  OCCUPATION: retired   PLOF: Independent, Independent with basic ADLs, Independent with household mobility without device, Independent with community mobility without device, Independent with homemaking with ambulation, Independent with gait, and Independent with transfers  PATIENT GOALS: To be able to continue to exercise to reduce her Parkinson's symptoms  NEXT MD VISIT: prn  OBJECTIVE:  Note: Objective measures were completed at Evaluation unless otherwise noted.  DIAGNOSTIC FINDINGS:  na  PATIENT SURVEYS:  Modified Oswestry:  MODIFIED OSWESTRY DISABILITY SCALE  Date: 03/23/24 Score 14/50     Date: 05/19/24 Score 6/50  Total    Interpretation of scores: Score Category Description  0-20% Minimal Disability The patient can cope with most living activities. Usually no treatment is indicated apart from advice on lifting, sitting and exercise  21-40% Moderate Disability The patient experiences more pain and difficulty with sitting, lifting and standing. Travel and social life are more difficult and they may be disabled from work. Personal care, sexual activity and sleeping are not grossly affected, and the patient can usually be managed by conservative means  41-60% Severe Disability Pain remains the main problem in this group, but activities of daily living are affected. These patients require a detailed investigation  61-80% Crippled Back pain impinges on all aspects of the patient's life. Positive intervention is required  81-100% Bed-bound  These patients are either bed-bound or exaggerating their symptoms  [name], et al. Surgery versus conservative management of stable thoracolumbar fracture: the PRESTO feasibility RCT. [place] ([place]): [employer]; 2021 Nov. [facility] Technology Assessment, No. 25.62.) Appendix 3, Oswestry Disability Index  category descriptors. Available from: Findjewelers.cz  Minimally Clinically Important Difference (MCID) = 12.8%  COGNITION: Overall cognitive status: Within functional limits for tasks assessed     SENSATION: Occasional pain down the left outer thigh and into the knee but appears to be referred vs radicular  MUSCLE LENGTH: Initial eval: Hamstrings: Right 55 deg; Left 50 deg Thomas test: Right pos; Left pos  05/19/24: Hamstrings:  Right 70 deg; Left 70 deg Thomas test: Right pos; Left pos   POSTURE: Scoliosis obvious, right trunk shift, even more pronounced lying down  PALPATION: Tender left IT band  LUMBAR ROM:   AROM eval 04/28/24  Flexion WNL WNL  Extension 50% 75%  Right lateral flexion WNL WNL  Left lateral flexion Fingertips to just above joint line WFL  Right rotation WNL WNL  Left rotation WNL WNL   (Blank rows = not tested)  LOWER EXTREMITY ROM:     WFL  LOWER EXTREMITY MMT:    Initial eval: Generally 4 to 4+/5 with exception of hip extension 3+ bilaterally, hip abduction 4- right hip, 3+ left hip  04/28/24: Improved to 4+/5 on all   LUMBAR SPECIAL TESTS:  Straight leg raise test: Negative, FABER test: Negative, and Thomas test: Positive  FUNCTIONAL TESTS:  Initial eval: 5 times sit to stand: 10.74 sec Timed up and go (TUG): 9.40 sec  04/28/24: 5 times sit to stand: 7.29 sec Timed up and go (TUG): 7.05 sec   04/28/24: 5 times sit to stand: 6.83 sec Timed up and go (TUG): 6.83 sec GAIT: Distance walked: 30 feet Assistive device utilized: None Level of assistance: Modified independence Comments: fairly normal heel to toe progression  TREATMENT DATE:  05/19/24 Nustep x 5 min level 5 Re-assessment for recert and reauth Palloff press on balance pad at matrix with 15 lbs 2 x 10 each direction Chops with 15 lbs x 10 each side from up to down, then x 10 each side from bottom to top Cross body punches with 2 lb  dumbbell standing on balance pad x 20 Standing on balance pad with 2 lb dumbbells with arms overhead alternate hand to knee Seated Russian twist with feet unsupported 2 x 10 with 10 lb kb Seated shoulder to hip with feet unsupported 2 x 10 with 10 lb kb  05/12/2024 Nustep x 6 min level 5 Left IT band rolling x 1 min  Supine IT band stretch on left x 5 hold 10 sec Supine heel grabs 2 x 20 Supine PPT 2 x 10 Supine PPT with dying bug unsupported x 20 Supine PPT + march 2 x 20 bilateral vc's for maintaining tilt (unsupported) Supine PPT with hand to opposite knee 2 x 10 each side with 4lbs (unsupported) Supine PPT with clamshell using black loop x 20 Supine PPT  bridge and clamshell with black loop combo 2 x 10 Tail wags 2 x 20 (verbal cues to reach when going to left) Sit to stand x 10 holding 10 lb kb Squat to table x 10 holding 10 lb kb Step up and hold fwd and lateral x 10 each LE in each direction on balance pad  Around the worlds x 10 each way with 10 lb kb standing on balance pad Standing in tandem on balance pad : locomotives with 2 lb dumbbells Lunge to BOSU  x 10 each LE fwd, then x 10 each LE lateral Seated mini sit ups 2 x 10 with 10 lb kb  Seated modified Russian twist with 10 lb kb x 20 (10 each side) Seated shoulder to hip in reclined position with 10lb kb  Peanut roll stretch x 10 to each side holding 5 sec each Palloff press x 10 each direction with 10 lbs at matrix standing  on balance pad.   05/05/2024 Nustep x 6 min level 5 Supine IT band stretch on left x 10 hold 10 sec Supine heel grabs 2  x 20 Supine PPT 2 x 10 Supine PPT + march 2 x 20 bilateral vc's for maintaining tilt (unsupported) Supine PPT with dying bug unsupported x 20 Supine PPT with hand to opposite knee 2 x 10 each side with 4lbs (unsupported) Supine PPT with clamshell using black loop x 20 Supine PPT  bridge and clamshell with black loop combo 2 x 10 Tail wags 2 x 20 (verbal cues to reach when  going to left) Sit to stand x 10 holding 10 lb kb Squat to table x 10 holding 10 lb kb Around the worlds x 10 each way with 10 lb kb standing on balance pad Seated mini sit ups 2 x 10 with 10 lb kb  Seated modified Russian twist with 10 lb kb x 20 (10 each side) Seated shoulder to hip in reclined position with 10lb kb  Seated opposite arm to opposite knee with 3 lbs x 20  Standing opposite arm to opposite knee with 3 lbs x 20 on balance pad Peanut roll stretch x 10 to each side holding 5 sec each Palloff press x 10 each direction with 10 lbs at matrix standing  on balance pad.    PATIENT EDUCATION:  Education details: initiated HEP, Educated on proper sleeping posture and lumbar support, Educated on appropriate exercises and those to avoid to reduce stress to the lumbar spine Person educated: Patient Education method: Programmer, Multimedia, Demonstration, Verbal cues, and Handouts Education comprehension: verbalized understanding, returned demonstration, verbal cues required, and tactile cues required  HOME EXERCISE PROGRAM: Access Code: [email or web address] URL: [email or web address] Date: 04/17/2024 Prepared by: Delon Haddock  Exercises - Standing Hamstring Stretch on Chair  - 1 x daily - 7 x weekly - 1 sets - 3 reps - 30 sec hold - Quadricep Stretch with Chair and Counter Support  - 1 x daily - 7 x weekly - 1 sets - 3 reps - 30 sec hold - Seated Figure 4 Piriformis Stretch  - 1 x daily - 7 x weekly - 1 sets - 3 reps - 30 sed hold - Seated Lateral Trunk Stretch on Swiss Ball  - 1 x daily - 7 x weekly - 1 sets - 10 reps - 5 sec hold - Supine Posterior Pelvic Tilt  - 1 x daily - 7 x weekly - 2 sets - 10 reps - Bent Knee Fallouts  - 1 x daily - 7 x weekly - 1 sets - 10 reps - Supine March with Posterior Pelvic Tilt  - 1 x daily - 7 x weekly - 1 sets - 20 reps - Dead Bug  - 1 x daily - 7 x weekly - 1 sets - 20 reps - Clamshell  - 1 x daily - 7 x weekly - 2 sets - 10 reps - Abdominal Press  into Geary  - 1 x daily - 7 x weekly - 2 sets - 10 reps ASSESSMENT:  CLINICAL IMPRESSION: We did re-assessment today.  Lanah shows improvement in all objective findings.  She is having some continued mild left low back pain and has not resumed her usual exercise classes as she is fearful that she may have another set back.  We added some higher level activities today and she was challenged but completed all tasks with no c/o increased pain.  She is improving but would benefit from continuing skilled PT for flexibility and core strength along with balance training for a few more visits.     OBJECTIVE IMPAIRMENTS: difficulty walking, decreased ROM, decreased  strength, increased fascial restrictions, increased muscle spasms, impaired flexibility, improper body mechanics, postural dysfunction, and pain.   ACTIVITY LIMITATIONS: carrying, lifting, bending, standing, squatting, sleeping, stairs, transfers, bed mobility, bathing, toileting, dressing, and caring for others  PARTICIPATION LIMITATIONS: meal prep, cleaning, laundry, driving, shopping, community activity, and yard work  PERSONAL FACTORS: Fitness and 1-2 comorbidities: Parkinsons and Gilberts syndrome are also affecting patient's functional outcome.   REHAB POTENTIAL: Good  CLINICAL DECISION MAKING: Evolving/moderate complexity  EVALUATION COMPLEXITY: Moderate   GOALS: Goals reviewed with patient? Yes  SHORT TERM GOALS: Target date: 04/20/2024  Pain report to be no greater than 4/10  Baseline: Goal status: MET 04/15/24  2.  Patient will be independent with initial HEP  Baseline:  Goal status: MET 04/17/24  3.  Patient to report 30% or better improvement in overall function Baseline:  Goal status: MET 04/29/24   LONG TERM GOALS: Target date: 05/18/2024  Patient to report pain no greater than 2/10  Baseline:  Goal status: MET 04/29/24  2.  Patient to be independent with advanced HEP  Baseline:  Goal status: MET  04/29/24  3.  Patients Oswestry score to improve by 3-5 points Baseline:  Goal status: In Progress  4.  Functional scores to improve by 2-3 seconds Baseline:  Goal status: MET 04/28/24  5.  Patient to be able to sleep through the night  Baseline:  Goal status: MET 05/05/24  6.  Patient to report 85% improvement in overall symptoms Baseline:  Goal status: In Progress  PLAN:  PT FREQUENCY: 1-2x/week  PT DURATION: 8 weeks  PLANNED INTERVENTIONS: 97110-Therapeutic exercises, 97530- Therapeutic activity, [id number]- Neuromuscular re-education, 97535- Self Care, [id number]- Manual therapy, [phone]- Gait training, [phone]- Canalith repositioning, [id number]- Aquatic Therapy, [phone]- Electrical stimulation (unattended), [id number]- Electrical stimulation (manual), [id number]- Ultrasound, [id number]- Traction (mechanical), [id number]- Ionotophoresis 4mg /ml Dexamethasone , Patient/Family education, Balance training, Stair training, Taping, Joint mobilization, Spinal mobilization, Vestibular training, DME instructions, Cryotherapy, and Moist heat.  PLAN FOR NEXT SESSION: Nustep, Hip hiking and stability, tail wags, progress LE flexibility, progress core strengthening, correcting trunk shift,  continue TA activation; hinging technique   Calistro Rauf B. Zephyr Sausedo, PT 05/19/24 2:50 PM [facility] [address], [address] [place], [place] [address] Phone # [phone] Fax [phone]

## 2024-05-20 ENCOUNTER — Inpatient Hospital Stay: Admission: RE | Admit: 2024-05-20 | Discharge: 2024-05-20

## 2024-05-20 DIAGNOSIS — Z1231 Encounter for screening mammogram for malignant neoplasm of breast: Secondary | ICD-10-CM | POA: Diagnosis not present

## 2024-05-21 ENCOUNTER — Ambulatory Visit: Admitting: Physical Therapy

## 2024-05-25 ENCOUNTER — Inpatient Hospital Stay: Admitting: Family

## 2024-05-25 ENCOUNTER — Encounter: Payer: Self-pay | Admitting: Hematology & Oncology

## 2024-05-25 ENCOUNTER — Inpatient Hospital Stay: Attending: Hematology & Oncology

## 2024-05-25 VITALS — BP 129/77 | HR 81 | Temp 99.0°F | Resp 20 | Ht 66.0 in | Wt 197.0 lb

## 2024-05-25 DIAGNOSIS — Z7952 Long term (current) use of systemic steroids: Secondary | ICD-10-CM | POA: Diagnosis not present

## 2024-05-25 DIAGNOSIS — D693 Immune thrombocytopenic purpura: Secondary | ICD-10-CM | POA: Insufficient documentation

## 2024-05-25 DIAGNOSIS — E538 Deficiency of other specified B group vitamins: Secondary | ICD-10-CM

## 2024-05-25 DIAGNOSIS — G20A1 Parkinson's disease without dyskinesia, without mention of fluctuations: Secondary | ICD-10-CM

## 2024-05-25 LAB — CBC WITH DIFFERENTIAL (CANCER CENTER ONLY)
Abs Immature Granulocytes: 0.02 K/uL (ref 0.00–0.07)
Basophils Absolute: 0.1 K/uL (ref 0.0–0.1)
Basophils Relative: 1 %
Eosinophils Absolute: 0.2 K/uL (ref 0.0–0.5)
Eosinophils Relative: 3 %
HCT: 42.3 % (ref 36.0–46.0)
Hemoglobin: 14.4 g/dL (ref 12.0–15.0)
Immature Granulocytes: 0 %
Lymphocytes Relative: 16 %
Lymphs Abs: 1.2 K/uL (ref 0.7–4.0)
MCH: 31 pg (ref 26.0–34.0)
MCHC: 34 g/dL (ref 30.0–36.0)
MCV: 91 fL (ref 80.0–100.0)
Monocytes Absolute: 0.8 K/uL (ref 0.1–1.0)
Monocytes Relative: 10 %
Neutro Abs: 5.1 K/uL (ref 1.7–7.7)
Neutrophils Relative %: 70 %
Platelet Count: 265 K/uL (ref 150–400)
RBC: 4.65 MIL/uL (ref 3.87–5.11)
RDW: 12.6 % (ref 11.5–15.5)
WBC Count: 7.4 K/uL (ref 4.0–10.5)
nRBC: 0 % (ref 0.0–0.2)

## 2024-05-25 LAB — CMP (CANCER CENTER ONLY)
ALT: <5 U/L (ref 0–44)
AST: 17 U/L (ref 15–41)
Albumin: 4.5 g/dL (ref 3.5–5.0)
Alkaline Phosphatase: 67 U/L (ref 38–126)
Anion gap: 11 (ref 5–15)
BUN: 10 mg/dL (ref 8–23)
CO2: 24 mmol/L (ref 22–32)
Calcium: 9.5 mg/dL (ref 8.9–10.3)
Chloride: 107 mmol/L (ref 98–111)
Creatinine: 0.73 mg/dL (ref 0.44–1.00)
GFR, Estimated: >60 mL/min (ref >60)
Glucose, Bld: 113 mg/dL — ABNORMAL HIGH (ref 70–99)
Potassium: 4.9 mmol/L (ref 3.5–5.1)
Sodium: 142 mmol/L (ref 135–145)
Total Bilirubin: 1.4 mg/dL — ABNORMAL HIGH (ref 0.0–1.2)
Total Protein: 6.2 g/dL — ABNORMAL LOW (ref 6.5–8.1)

## 2024-05-25 LAB — SAVE SMEAR(SSMR), FOR PROVIDER SLIDE REVIEW

## 2024-05-25 NOTE — Progress Notes (Signed)
 Hematology and Oncology Follow Up Visit  Dominique Griffin [id number] 08/09/1950 73 y.o. 05/25/2024   Principle Diagnosis:  ITP-relapsed   Current Therapy:        Prednisone  80 mg po q day --taper down to 5 mg p.o. daily on 01/07/2023 Decadron  40 mg p.o. daily x 4 days-start 02/20/2023 IVIG 1 g/kg x 1 day-given on 02/22/2023 Rituxan  375 mg/m weekly x 4 weeks-start on 02/27/2023   Interim History:  Dominique Griffin is here today with her husband for follow-up. She is doing quite well.  She is in PT and feels that this has helped quite a bit with her history of Parkinson's. She is hoping to get back to her boxing workouts soon.  Platelets remain stable at 265.  No issues with blood loss, abnormal bruising, no petechiae.  She notes chronic fatigue unchanged from baseline.  No fever, chills, n/v, cough, rash, SOB, chest pain, palpitations, abdominal pain or changes in bowel or bladder habits.  She does have occasional episodes of dizziness with Parkinson's.  No falls or syncope reported.  Appetite and hydration are good. Weight is stable at 197 lbs.   ECOG Performance Status: 1 - Symptomatic but completely ambulatory  Medications:  Allergies as of 05/25/2024   No Known Allergies      Medication List        Accurate as of May 25, 2024 12:18 PM. If you have any questions, ask your nurse or doctor.          STOP taking these medications    predniSONE  20 MG tablet Commonly known as: DELTASONE  Stopped by: Lauraine Pepper       TAKE these medications    acetaminophen  325 MG tablet Commonly known as: TYLENOL    Carbidopa-Levodopa ER 25-100 MG tablet controlled release Commonly known as: SINEMET CR Take 1 tablet by mouth 3 (three) times daily. What changed:  when to take this additional instructions   carbidopa-levodopa 25-100 MG tablet Commonly known as: SINEMET IR Take 1 tablet by mouth 3 (three) times daily. 7Am 1 tablet  at 11 half tablet and at 4pm half tablet What  changed: additional instructions   Cholecalciferol 50 MCG (2000 UT) Tabs Take by mouth daily. What changed: how much to take   CYANOCOBALAMIN  PO Take 2,500 mcg by mouth 3 (three) times a week.   meloxicam  7.5 MG tablet Commonly known as: MOBIC  Take 1-2 tablets (7.5-15 mg total) by mouth daily. Take with food   naproxen 250 MG tablet Commonly known as: NAPROSYN 2 (two) times daily as needed.        Allergies: No Known Allergies  Past Medical History, Surgical history, Social history, and Family History were reviewed and updated.  Review of Systems: All other 10 point review of systems is negative.   Physical Exam:  height is 5' 6 (1.676 m) and weight is 197 lb 0.6 oz (89.4 kg). Her oral temperature is 99 F (37.2 C). Her blood pressure is 129/77 and her pulse is 81. Her respiration is 20 and oxygen saturation is 99%.   Wt Readings from Last 3 Encounters:  05/25/24 197 lb 0.6 oz (89.4 kg)  03/10/24 201 lb (91.2 kg)  01/21/24 202 lb 6.4 oz (91.8 kg)    Ocular: Sclerae unicteric, pupils equal, round and reactive to light Ear-nose-throat: Oropharynx clear, dentition fair Lymphatic: No cervical or supraclavicular adenopathy Lungs no rales or rhonchi, good excursion bilaterally Heart regular rate and rhythm, no murmur appreciated Abd soft, nontender, positive bowel sounds  MSK no focal spinal tenderness, no joint edema Neuro: non-focal, well-oriented, appropriate affect Breasts: Deferred   Lab Results  Component Value Date   WBC 7.4 05/25/2024   HGB 14.4 05/25/2024   HCT 42.3 05/25/2024   MCV 91.0 05/25/2024   PLT 265 05/25/2024   Lab Results  Component Value Date   FERRITIN 41 09/07/2022   IRON 120 09/07/2022   TIBC 396 09/07/2022   UIBC 276 09/07/2022   IRONPCTSAT 30 09/07/2022   Lab Results  Component Value Date   RETICCTPCT 1.5 09/07/2022   RBC 4.65 05/25/2024   No results found for: KPAFRELGTCHN, LAMBDASER, KAPLAMBRATIO No results found for:  KIMBERLY LE, IGMSERUM Lab Results  Component Value Date   TOTALPROTELP 6.5 09/07/2022   ALBUMINELP 4.1 09/07/2022   A1GS 0.3 09/07/2022   A2GS 0.6 09/07/2022   BETS 1.0 09/07/2022   GAMS 0.5 09/07/2022   MSPIKE Not Observed 09/07/2022   SPEI Comment 09/07/2022     Chemistry      Component Value Date/Time   NA 142 05/25/2024 1138   K 4.9 05/25/2024 1138   CL 107 05/25/2024 1138   CO2 24 05/25/2024 1138   BUN 10 05/25/2024 1138   CREATININE 0.73 05/25/2024 1138      Component Value Date/Time   CALCIUM 9.5 05/25/2024 1138   ALKPHOS 67 05/25/2024 1138   AST 17 05/25/2024 1138   ALT <5 05/25/2024 1138   BILITOT 1.4 (H) 05/25/2024 1138       Impression and Plan: Dominique Griffin is a pleasant 73 yo caucasian female with relapse of the ITP.  She was initially diagnosed in 2024. We had her on a prednisone  taper.  She she subsequently had relapsed.  We treated her with IVIG and Rituxan  completed in 04/2023.  She has had a very nice response and remains in remission.  Her counts remain stable. Platelets 265.  Follow-up in 4 months.   Lauraine Pepper, NP 11/17/202512:18 PM

## 2024-06-08 ENCOUNTER — Ambulatory Visit

## 2024-06-08 DIAGNOSIS — R293 Abnormal posture: Secondary | ICD-10-CM | POA: Insufficient documentation

## 2024-06-08 DIAGNOSIS — M6281 Muscle weakness (generalized): Secondary | ICD-10-CM | POA: Diagnosis present

## 2024-06-08 DIAGNOSIS — M5459 Other low back pain: Secondary | ICD-10-CM | POA: Diagnosis present

## 2024-06-08 DIAGNOSIS — R252 Cramp and spasm: Secondary | ICD-10-CM | POA: Insufficient documentation

## 2024-06-08 DIAGNOSIS — R262 Difficulty in walking, not elsewhere classified: Secondary | ICD-10-CM | POA: Diagnosis present

## 2024-06-08 NOTE — Therapy (Signed)
 OUTPATIENT PHYSICAL THERAPY THORACOLUMBAR TREATMENT  Patient Name: Dominique Griffin MRN: [id number] DOB:17-Jan-1951, 73 y.o., female Today's Date: 06/08/2024  END OF SESSION:  PT End of Session - 06/08/24 1454     Visit Number 16    Number of Visits 16    Date for Recertification  07/13/24    Authorization Type [employer] Approved 16 visits-03/23/24-06/21/2024-auth#[id number]// 8 more approved 06/25/24 through 08/18/24    Authorization Time Period 12/18//25- 08/1024    Authorization - Visit Number 16    Authorization - Number of Visits 24    Progress Note Due on Visit 20    PT Start Time 1445    PT Stop Time 1531    PT Time Calculation (min) 46 min    Activity Tolerance Patient tolerated treatment well    Behavior During Therapy WFL for tasks assessed/performed            Past Medical History:  Diagnosis Date   Gilbert's syndrome    History of ITP    Osteopenia    Parkinson disease (HCC)    Past Surgical History:  Procedure Laterality Date   ABDOMINAL HYSTERECTOMY  1993   fibroids, ovaries remain   BREAST EXCISIONAL BIOPSY Left    skin bbiopsy   TONSILLECTOMY     Patient Active Problem List   Diagnosis Date Noted   Lumbar back pain with radiculopathy affecting left lower extremity 03/10/2024   Class 1 obesity due to excess calories without serious comorbidity with body mass index (BMI) of 30.0 to 30.9 in adult 12/15/2023   Gallstones 02/25/2023   B12 deficiency 01/07/2023   Dizziness - PPPD 10/24/2022   Chronic ITP (idiopathic thrombocytopenia) (HCC) 09/07/2022   Clinical trial exam 06/27/2021   Osteopenia 11/13/2020   Vitamin D deficiency 11/13/2020   Diverticulosis of colon 11/13/2020   Family history of colon cancer 11/13/2020   Arthritis 09/27/2020   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   Coccyx pain 09/27/2020   Parkinson's disease - Dr Evonnie 12/02/2019    PCP: Geofm Glade PARAS, MD  REFERRING PROVIDER: Geofm Glade PARAS, MD  REFERRING DIAG: M54.9 (ICD-10-CM) - Back pain,  unspecified back location, unspecified back pain laterality, unspecified chronicity  Rationale for Evaluation and Treatment: Rehabilitation  THERAPY DIAG:  Other low back pain  Muscle weakness (generalized)  Difficulty in walking, not elsewhere classified  Cramp and spasm  Abnormal posture  ONSET DATE: 03/19/2024  SUBJECTIVE:                                                                                                                                                                                           SUBJECTIVE  STATEMENT: Patient reports she returned to her Parkinson's rock box class but is doing the beginner class.  No set backs so far.  Back still feels good.       From Eval: Back pain since she was in her 40's. In her 50's fell from a ladder.  In her 34's, a lot of OA found.  In her 3's, diagnosed with Parkinsons.  Now very active doing [employer], multiple Parkinsons classes, Tai Chi, multiple stretching classes to try and deal with the parkinsons symptoms.  She was doing happy baby and felt pain in the low back flare up.  She was prescribed dose pack and other meds.  She hopes to eliminate the back pain so that she can continue working on controlling her Parkinson's symptoms.    PERTINENT HISTORY:  Parkinson's    PAIN:  112/01/25 Are you having pain? Yes: NPRS scale: 0/10 Pain location: Left lower back, left lateral LE, occasional left lateral knee Pain description: aching Aggravating factors: standing Relieving factors: exercise, meds  PRECAUTIONS: None  RED FLAGS: None   WEIGHT BEARING RESTRICTIONS: No  FALLS:  Has patient fallen in last 6 months? No  LIVING ENVIRONMENT: Lives with: lives with their family Lives in: House/apartment Stairs: Yes: Internal: 17 steps; on right going up and External: 5 steps; on right going up Has following equipment at home: None  OCCUPATION: retired   PLOF: Independent, Independent with basic ADLs, Independent with  household mobility without device, Independent with community mobility without device, Independent with homemaking with ambulation, Independent with gait, and Independent with transfers  PATIENT GOALS: To be able to continue to exercise to reduce her Parkinson's symptoms  NEXT MD VISIT: prn  OBJECTIVE:  Note: Objective measures were completed at Evaluation unless otherwise noted.  DIAGNOSTIC FINDINGS:  na  PATIENT SURVEYS:  Modified Oswestry:  MODIFIED OSWESTRY DISABILITY SCALE  Date: 03/23/24 Score 14/50     Date: 05/19/24 Score 6/50  Total    Interpretation of scores: Score Category Description  0-20% Minimal Disability The patient can cope with most living activities. Usually no treatment is indicated apart from advice on lifting, sitting and exercise  21-40% Moderate Disability The patient experiences more pain and difficulty with sitting, lifting and standing. Travel and social life are more difficult and they may be disabled from work. Personal care, sexual activity and sleeping are not grossly affected, and the patient can usually be managed by conservative means  41-60% Severe Disability Pain remains the main problem in this group, but activities of daily living are affected. These patients require a detailed investigation  61-80% Crippled Back pain impinges on all aspects of the patient's life. Positive intervention is required  81-100% Bed-bound  These patients are either bed-bound or exaggerating their symptoms  [name], et al. Surgery versus conservative management of stable thoracolumbar fracture: the PRESTO feasibility RCT. [place] ([place]): [employer]; 2021 Nov. [facility] Technology Assessment, No. 25.62.) Appendix 3, Oswestry Disability Index category descriptors. Available from: Findjewelers.cz  Minimally Clinically Important Difference (MCID) = 12.8%  COGNITION: Overall cognitive status: Within functional  limits for tasks assessed     SENSATION: Occasional pain down the left outer thigh and into the knee but appears to be referred vs radicular  MUSCLE LENGTH: Initial eval: Hamstrings: Right 55 deg; Left 50 deg Thomas test: Right pos; Left pos  05/19/24: Hamstrings: Right 70 deg; Left 70 deg Thomas test: Right pos; Left pos   POSTURE: Scoliosis obvious, right trunk  shift, even more pronounced lying down  PALPATION: Tender left IT band  LUMBAR ROM:   AROM eval 04/28/24  Flexion WNL WNL  Extension 50% 75%  Right lateral flexion WNL WNL  Left lateral flexion Fingertips to just above joint line WFL  Right rotation WNL WNL  Left rotation WNL WNL   (Blank rows = not tested)  LOWER EXTREMITY ROM:     WFL  LOWER EXTREMITY MMT:    Initial eval: Generally 4 to 4+/5 with exception of hip extension 3+ bilaterally, hip abduction 4- right hip, 3+ left hip  04/28/24: Improved to 4+/5 on all   LUMBAR SPECIAL TESTS:  Straight leg raise test: Negative, FABER test: Negative, and Thomas test: Positive  FUNCTIONAL TESTS:  Initial eval: 5 times sit to stand: 10.74 sec Timed up and go (TUG): 9.40 sec  04/28/24: 5 times sit to stand: 7.29 sec Timed up and go (TUG): 7.05 sec   04/28/24: 5 times sit to stand: 6.83 sec Timed up and go (TUG): 6.83 sec GAIT: Distance walked: 30 feet Assistive device utilized: None Level of assistance: Modified independence Comments: fairly normal heel to toe progression  TREATMENT DATE:  06/08/2024 Nustep x 6 min level 5 Left IT band rolling x 1 min  Supine IT band stretch on left x 5 hold 10 sec Supine heel grabs 2 x 20 Supine PPT 2 x 10 Supine PPT with dying bug unsupported x 20 Supine PPT + march 2 x 20 bilateral vc's for maintaining tilt (unsupported) Supine PPT with hand to opposite knee 2 x 10 each side with 4lbs (unsupported) Supine PPT with clamshell using black loop x 20 Supine PPT  bridge and clamshell with black loop combo 2 x  10 Tail wags 2 x 20 (verbal cues to reach when going to left) Sit to stand x 10 holding 10 lb kb Squat to table x 10 holding 10 lb kb [place] x 10 each way with 10 lb kb standing on balance pad Standing in tandem on balance pad : locomotives with 2 lb dumbbells 2 sets Step up and hold fwd and lateral x 10 each LE in each direction on balance pad  Lunge to BOSU  x 10 each LE fwd, then x 10 each LE lateral  05/19/24 Nustep x 5 min level 5 Re-assessment for recert and reauth Palloff press on balance pad at matrix with 15 lbs 2 x 10 each direction Chops with 15 lbs x 10 each side from up to down, then x 10 each side from bottom to top Cross body punches with 2 lb dumbbell standing on balance pad x 20 Standing on balance pad with 2 lb dumbbells with arms overhead alternate hand to knee Seated Russian twist with feet unsupported 2 x 10 with 10 lb kb Seated shoulder to hip with feet unsupported 2 x 10 with 10 lb kb  05/12/2024 Nustep x 6 min level 5 Left IT band rolling x 1 min  Supine IT band stretch on left x 5 hold 10 sec Supine heel grabs 2 x 20 Supine PPT 2 x 10 Supine PPT with dying bug unsupported x 20 Supine PPT + march 2 x 20 bilateral vc's for maintaining tilt (unsupported) Supine PPT with hand to opposite knee 2 x 10 each side with 4lbs (unsupported) Supine PPT with clamshell using black loop x 20 Supine PPT  bridge and clamshell with black loop combo 2 x 10 Tail wags 2 x 20 (verbal cues to reach  when going to left) Sit to stand x 10 holding 10 lb kb Squat to table x 10 holding 10 lb kb Step up and hold fwd and lateral x 10 each LE in each direction on balance pad  [place] x 10 each way with 10 lb kb standing on balance pad Standing in tandem on balance pad : locomotives with 2 lb dumbbells Lunge to BOSU  x 10 each LE fwd, then x 10 each LE lateral Seated mini sit ups 2 x 10 with 10 lb kb  Seated modified Russian twist with 10 lb kb x 20 (10 each  side) Seated shoulder to hip in reclined position with 10lb kb  Peanut roll stretch x 10 to each side holding 5 sec each Palloff press x 10 each direction with 10 lbs at matrix standing  on balance pad.   PATIENT EDUCATION:  Education details: initiated HEP, Educated on proper sleeping posture and lumbar support, Educated on appropriate exercises and those to avoid to reduce stress to the lumbar spine Person educated: Patient Education method: Programmer, Multimedia, Demonstration, Verbal cues, and Handouts Education comprehension: verbalized understanding, returned demonstration, verbal cues required, and tactile cues required  HOME EXERCISE PROGRAM: Access Code: [email or web address] URL: [email or web address] Date: 04/17/2024 Prepared by: Delon Haddock  Exercises - Standing Hamstring Stretch on Chair  - 1 x daily - 7 x weekly - 1 sets - 3 reps - 30 sec hold - Quadricep Stretch with Chair and Counter Support  - 1 x daily - 7 x weekly - 1 sets - 3 reps - 30 sec hold - Seated Figure 4 Piriformis Stretch  - 1 x daily - 7 x weekly - 1 sets - 3 reps - 30 sed hold - Seated Lateral Trunk Stretch on Swiss Ball  - 1 x daily - 7 x weekly - 1 sets - 10 reps - 5 sec hold - Supine Posterior Pelvic Tilt  - 1 x daily - 7 x weekly - 2 sets - 10 reps - Bent Knee Fallouts  - 1 x daily - 7 x weekly - 1 sets - 10 reps - Supine March with Posterior Pelvic Tilt  - 1 x daily - 7 x weekly - 1 sets - 20 reps - Dead Bug  - 1 x daily - 7 x weekly - 1 sets - 20 reps - Clamshell  - 1 x daily - 7 x weekly - 2 sets - 10 reps - Abdominal Press into [place]  - 1 x daily - 7 x weekly - 2 sets - 10 reps ASSESSMENT:  CLINICAL IMPRESSION: Angellina needed some vc's to slow her reps down on balance tasks.  Emphasized need to work on balance strategies.  She has resumed the low level class for rock box and had no set backs with regard to her back pain.  She would benefit from continuing skilled PT for flexibility and core strength  along with balance training for a few more visits.  Insurance approved 8 more visits through 08/19/23.     OBJECTIVE IMPAIRMENTS: difficulty walking, decreased ROM, decreased strength, increased fascial restrictions, increased muscle spasms, impaired flexibility, improper body mechanics, postural dysfunction, and pain.   ACTIVITY LIMITATIONS: carrying, lifting, bending, standing, squatting, sleeping, stairs, transfers, bed mobility, bathing, toileting, dressing, and caring for others  PARTICIPATION LIMITATIONS: meal prep, cleaning, laundry, driving, shopping, community activity, and yard work  PERSONAL FACTORS: Fitness and 1-2 comorbidities: Parkinsons and Gilberts syndrome are also affecting patient's functional outcome.   REHAB POTENTIAL:  Good  CLINICAL DECISION MAKING: Evolving/moderate complexity  EVALUATION COMPLEXITY: Moderate   GOALS: Goals reviewed with patient? Yes  SHORT TERM GOALS: Target date: 04/20/2024  Pain report to be no greater than 4/10  Baseline: Goal status: MET 04/15/24  2.  Patient will be independent with initial HEP  Baseline:  Goal status: MET 04/17/24  3.  Patient to report 30% or better improvement in overall function Baseline:  Goal status: MET 04/29/24   LONG TERM GOALS: Target date: 05/18/2024  Patient to report pain no greater than 2/10  Baseline:  Goal status: MET 04/29/24  2.  Patient to be independent with advanced HEP  Baseline:  Goal status: MET 04/29/24  3.  Patients Oswestry score to improve by 3-5 points Baseline:  Goal status: In Progress  4.  Functional scores to improve by 2-3 seconds Baseline:  Goal status: MET 04/28/24  5.  Patient to be able to sleep through the night  Baseline:  Goal status: MET 05/05/24  6.  Patient to report 85% improvement in overall symptoms Baseline:  Goal status: In Progress  PLAN:  PT FREQUENCY: 1-2x/week  PT DURATION: 8 weeks  PLANNED INTERVENTIONS: 97110-Therapeutic exercises,  97530- Therapeutic activity, [id number]- Neuromuscular re-education, 97535- Self Care, [id number]- Manual therapy, [phone]- Gait training, [phone]- Canalith repositioning, [id number]- Aquatic Therapy, [phone]- Electrical stimulation (unattended), [id number]- Electrical stimulation (manual), [id number]- Ultrasound, [id number]- Traction (mechanical), [id number]- Ionotophoresis 4mg /ml Dexamethasone , Patient/Family education, Balance training, Stair training, Taping, Joint mobilization, Spinal mobilization, Vestibular training, DME instructions, Cryotherapy, and Moist heat.  PLAN FOR NEXT SESSION: Nustep, Dynamic balance, Hip hiking and stability, tail wags, progress LE flexibility, progress core strengthening, correcting trunk shift,  continue TA activation; hinging technique   Radames Mejorado B. Bram Hottel, PT 06/08/24 3:33 PM [facility] [address], [address] [place], [place] [address] Phone # [phone] Fax [phone]

## 2024-06-11 ENCOUNTER — Inpatient Hospital Stay (HOSPITAL_BASED_OUTPATIENT_CLINIC_OR_DEPARTMENT_OTHER): Admission: RE | Admit: 2024-06-11 | Discharge: 2024-06-11 | Attending: Internal Medicine | Admitting: Internal Medicine

## 2024-06-11 DIAGNOSIS — E2839 Other primary ovarian failure: Secondary | ICD-10-CM | POA: Insufficient documentation

## 2024-06-11 DIAGNOSIS — Z780 Asymptomatic menopausal state: Secondary | ICD-10-CM | POA: Diagnosis not present

## 2024-06-11 DIAGNOSIS — M85852 Other specified disorders of bone density and structure, left thigh: Secondary | ICD-10-CM | POA: Diagnosis not present

## 2024-06-18 ENCOUNTER — Ambulatory Visit

## 2024-06-18 ENCOUNTER — Ambulatory Visit: Admitting: Physical Therapy

## 2024-06-18 DIAGNOSIS — M5459 Other low back pain: Secondary | ICD-10-CM | POA: Diagnosis not present

## 2024-06-18 DIAGNOSIS — M6281 Muscle weakness (generalized): Secondary | ICD-10-CM

## 2024-06-18 DIAGNOSIS — R262 Difficulty in walking, not elsewhere classified: Secondary | ICD-10-CM

## 2024-06-18 DIAGNOSIS — R252 Cramp and spasm: Secondary | ICD-10-CM

## 2024-06-18 DIAGNOSIS — R293 Abnormal posture: Secondary | ICD-10-CM

## 2024-06-18 NOTE — Therapy (Signed)
 OUTPATIENT PHYSICAL THERAPY THORACOLUMBAR TREATMENT  Patient Name: Dominique Griffin MRN: [id number] DOB:[birth year], 73 y.o., female Today's Date: 06/18/2024  END OF SESSION:  PT End of Session - 06/18/24 1527     Visit Number 17    Number of Visits 24    Date for Recertification  07/13/24    Authorization Time Period 12/18//25- 08/1024    PT Start Time 1155    PT Stop Time 1235    PT Time Calculation (min) 40 min    Activity Tolerance Patient tolerated treatment well    Behavior During Therapy WFL for tasks assessed/performed             Past Medical History:  Diagnosis Date   Gilbert's syndrome    History of ITP    Osteopenia    Parkinson disease (HCC)    Past Surgical History:  Procedure Laterality Date   ABDOMINAL HYSTERECTOMY  1993   fibroids, ovaries remain   BREAST EXCISIONAL BIOPSY Left    skin bbiopsy   TONSILLECTOMY     Patient Active Problem List   Diagnosis Date Noted   Lumbar back pain with radiculopathy affecting left lower extremity 03/10/2024   Class 1 obesity due to excess calories without serious comorbidity with body mass index (BMI) of 30.0 to 30.9 in adult 12/15/2023   Gallstones 02/25/2023   B12 deficiency 01/07/2023   Dizziness - PPPD 10/24/2022   Chronic ITP (idiopathic thrombocytopenia) (HCC) 09/07/2022   Clinical trial exam 06/27/2021   Osteoporosis 11/13/2020   Vitamin D deficiency 11/13/2020   Diverticulosis of colon 11/13/2020   Family history of colon cancer 11/13/2020   Arthritis 09/27/2020   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   Coccyx pain 09/27/2020   Parkinson's disease - Dr Evonnie 12/02/2019    PCP: Geofm Glade PARAS, MD  REFERRING PROVIDER: Geofm Glade PARAS, MD  REFERRING DIAG: M54.9 (ICD-10-CM) - Back pain, unspecified back location, unspecified back pain laterality, unspecified chronicity  Rationale for Evaluation and Treatment: Rehabilitation  THERAPY DIAG:  Other low back pain  Difficulty in walking, not elsewhere  classified  Abnormal posture  Muscle weakness (generalized)  Cramp and spasm  ONSET DATE: 03/19/2024  SUBJECTIVE:                                                                                                                                                                                           SUBJECTIVE STATEMENT: Patient reports she returned to her upper level  Parkinson's rock box class.  She is doing well and having no issues.  She would like to update her HEP to include more resistance exercises for her new diagnosis  of osteopenia.    From Eval: Back pain since she was in her 40's. In her 50's fell from a ladder.  In her 41's, a lot of OA found.  In her 81's, diagnosed with Parkinsons.  Now very active doing [employer], multiple Parkinsons classes, Tai Chi, multiple stretching classes to try and deal with the parkinsons symptoms.  She was doing happy baby and felt pain in the low back flare up.  She was prescribed dose pack and other meds.  She hopes to eliminate the back pain so that she can continue working on controlling her Parkinson's symptoms.    PERTINENT HISTORY:  Parkinson's    PAIN:  112/01/25 Are you having pain? Yes: NPRS scale: 0/10 Pain location: Left lower back, left lateral LE, occasional left lateral knee Pain description: aching Aggravating factors: standing Relieving factors: exercise, meds  PRECAUTIONS: None  RED FLAGS: None   WEIGHT BEARING RESTRICTIONS: No  FALLS:  Has patient fallen in last 6 months? No  LIVING ENVIRONMENT: Lives with: lives with their family Lives in: House/apartment Stairs: Yes: Internal: 17 steps; on right going up and External: 5 steps; on right going up Has following equipment at home: None  OCCUPATION: retired   PLOF: Independent, Independent with basic ADLs, Independent with household mobility without device, Independent with community mobility without device, Independent with homemaking with ambulation,  Independent with gait, and Independent with transfers  PATIENT GOALS: To be able to continue to exercise to reduce her Parkinson's symptoms  NEXT MD VISIT: prn  OBJECTIVE:  Note: Objective measures were completed at Evaluation unless otherwise noted.  DIAGNOSTIC FINDINGS:  na  PATIENT SURVEYS:  Modified Oswestry:  MODIFIED OSWESTRY DISABILITY SCALE  Date: 03/23/24 Score 14/50     Date: 05/19/24 Score 6/50  Total    Interpretation of scores: Score Category Description  0-20% Minimal Disability The patient can cope with most living activities. Usually no treatment is indicated apart from advice on lifting, sitting and exercise  21-40% Moderate Disability The patient experiences more pain and difficulty with sitting, lifting and standing. Travel and social life are more difficult and they may be disabled from work. Personal care, sexual activity and sleeping are not grossly affected, and the patient can usually be managed by conservative means  41-60% Severe Disability Pain remains the main problem in this group, but activities of daily living are affected. These patients require a detailed investigation  61-80% Crippled Back pain impinges on all aspects of the patients life. Positive intervention is required  81-100% Bed-bound  These patients are either bed-bound or exaggerating their symptoms  [name], et al. Surgery versus conservative management of stable thoracolumbar fracture: the PRESTO feasibility RCT. [place] ([place]): [employer]; 2021 Nov. [facility] Technology Assessment, No. 25.62.) Appendix 3, Oswestry Disability Index category descriptors. Available from: Findjewelers.cz  Minimally Clinically Important Difference (MCID) = 12.8%  COGNITION: Overall cognitive status: Within functional limits for tasks assessed     SENSATION: Occasional pain down the left outer thigh and into the knee but appears to be referred vs  radicular  MUSCLE LENGTH: Initial eval: Hamstrings: Right 55 deg; Left 50 deg Thomas test: Right pos; Left pos  05/19/24: Hamstrings: Right 70 deg; Left 70 deg Thomas test: Right pos; Left pos   POSTURE: Scoliosis obvious, right trunk shift, even more pronounced lying down  PALPATION: Tender left IT band  LUMBAR ROM:   AROM eval 04/28/24  Flexion WNL WNL  Extension 50% 75%  Right lateral flexion WNL WNL  Left lateral flexion Fingertips to just above joint line WFL  Right rotation WNL WNL  Left rotation WNL WNL   (Blank rows = not tested)  LOWER EXTREMITY ROM:     WFL  LOWER EXTREMITY MMT:    Initial eval: Generally 4 to 4+/5 with exception of hip extension 3+ bilaterally, hip abduction 4- right hip, 3+ left hip  04/28/24: Improved to 4+/5 on all   LUMBAR SPECIAL TESTS:  Straight leg raise test: Negative, FABER test: Negative, and Thomas test: Positive  FUNCTIONAL TESTS:  Initial eval: 5 times sit to stand: 10.74 sec Timed up and go (TUG): 9.40 sec  04/28/24: 5 times sit to stand: 7.29 sec Timed up and go (TUG): 7.05 sec   04/28/24: 5 times sit to stand: 6.83 sec Timed up and go (TUG): 6.83 sec GAIT: Distance walked: 30 feet Assistive device utilized: None Level of assistance: Modified independence Comments: fairly normal heel to toe progression  TREATMENT DATE:  06/08/2024 Nustep x 6 min level 5 Thoroughly went through all of HEP eliminating older, less challenging exercises, added some jump squats into sit to stand and squats to table Educated on how to section her program out into sitting, lying down, and standing throughout the day to avoid over fatigue and causing her Parkinson's symptoms to worsen.   06/08/2024 Nustep x 6 min level 5 Left IT band rolling x 1 min  Supine IT band stretch on left x 5 hold 10 sec Supine heel grabs 2 x 20 Supine PPT 2 x 10 Supine PPT with dying bug unsupported x 20 Supine PPT + march 2 x 20 bilateral vc's for  maintaining tilt (unsupported) Supine PPT with hand to opposite knee 2 x 10 each side with 4lbs (unsupported) Supine PPT with clamshell using black loop x 20 Supine PPT  bridge and clamshell with black loop combo 2 x 10 Tail wags 2 x 20 (verbal cues to reach when going to left) Sit to stand x 10 holding 10 lb kb Squat to table x 10 holding 10 lb kb [place] the worlds x 10 each way with 10 lb kb standing on balance pad Standing in tandem on balance pad : locomotives with 2 lb dumbbells 2 sets Step up and hold fwd and lateral x 10 each LE in each direction on balance pad  Lunge to BOSU  x 10 each LE fwd, then x 10 each LE lateral  05/19/24 Nustep x 5 min level 5 Re-assessment for recert and reauth Palloff press on balance pad at matrix with 15 lbs 2 x 10 each direction Chops with 15 lbs x 10 each side from up to down, then x 10 each side from bottom to top Cross body punches with 2 lb dumbbell standing on balance pad x 20 Standing on balance pad with 2 lb dumbbells with arms overhead alternate hand to knee Seated Russian twist with feet unsupported 2 x 10 with 10 lb kb Seated shoulder to hip with feet unsupported 2 x 10 with 10 lb kb   PATIENT EDUCATION:  Education details: initiated HEP, Educated on proper sleeping posture and lumbar support, Educated on appropriate exercises and those to avoid to reduce stress to the lumbar spine Person educated: Patient Education method: Programmer, Multimedia, Facilities Manager, Verbal cues, and Handouts Education comprehension: verbalized understanding, returned demonstration, verbal cues required, and tactile cues required  HOME EXERCISE PROGRAM: Access Code: [email or web address] URL: [email or web address] Date: 06/18/2024 Prepared by: Delon Haddock  Exercises -  Standing Hamstring Stretch on Chair  - 1 x daily - 7 x weekly - 1 sets - 3 reps - 30 sec hold - Quadricep Stretch with Chair and Counter Support  - 1 x daily - 7 x weekly - 1 sets - 3 reps - 30  sec hold - Single Leg Balance Raising Opposite Arm and Leg  - 1 x daily - 7 x weekly - 3 sets - 10 reps - Seated Figure 4 Piriformis Stretch  - 1 x daily - 7 x weekly - 1 sets - 3 reps - 30 sed hold - Seated Lateral Trunk Stretch on Swiss Ball  - 1 x daily - 7 x weekly - 1 sets - 10 reps - 5 sec hold - Supine 90/90 Alternating Heel Touches with Posterior Pelvic Tilt  - 1 x daily - 7 x weekly - 3 sets - 10 reps - Supine Dead Bug with Leg Extension  - 1 x daily - 7 x weekly - 3 sets - 10 reps - Clamshells with Resistance (BKA)  - 1 x daily - 7 x weekly - 3 sets - 10 reps - Hooklying Isometric Clamshell  - 1 x daily - 7 x weekly - 3 sets - 10 reps - Standing Balance Activity: Plyometric Squat to Jump  - 1 x daily - 7 x weekly - 1 sets - 10 reps - Squat Jumps  - 1 x daily - 7 x weekly - 3 sets - 10 reps - Sit to Stand Without Arm Support  - 1 x daily - 7 x weekly - 1 sets - 10 reps - Squat with Chair Touch  - 1 x daily - 7 x weekly - 1 sets - 10 reps ASSESSMENT:  CLINICAL IMPRESSION: Johnye is progressing well and is tolerating her upper level rock box class.  We made some modifications to her HEP to include some plyometric activities.  She would benefit from continuing skilled PT to meet final goals.     OBJECTIVE IMPAIRMENTS: difficulty walking, decreased ROM, decreased strength, increased fascial restrictions, increased muscle spasms, impaired flexibility, improper body mechanics, postural dysfunction, and pain.   ACTIVITY LIMITATIONS: carrying, lifting, bending, standing, squatting, sleeping, stairs, transfers, bed mobility, bathing, toileting, dressing, and caring for others  PARTICIPATION LIMITATIONS: meal prep, cleaning, laundry, driving, shopping, community activity, and yard work  PERSONAL FACTORS: Fitness and 1-2 comorbidities: Parkinsons and Gilberts syndrome are also affecting patient's functional outcome.   REHAB POTENTIAL: Good  CLINICAL DECISION MAKING: Evolving/moderate  complexity  EVALUATION COMPLEXITY: Moderate   GOALS: Goals reviewed with patient? Yes  SHORT TERM GOALS: Target date: 04/20/2024  Pain report to be no greater than 4/10  Baseline: Goal status: MET 04/15/24  2.  Patient will be independent with initial HEP  Baseline:  Goal status: MET 04/17/24  3.  Patient to report 30% or better improvement in overall function Baseline:  Goal status: MET 04/29/24   LONG TERM GOALS: Target date: 05/18/2024  Patient to report pain no greater than 2/10  Baseline:  Goal status: MET 04/29/24  2.  Patient to be independent with advanced HEP  Baseline:  Goal status: MET 04/29/24  3.  Patients Oswestry score to improve by 3-5 points Baseline:  Goal status: In Progress  4.  Functional scores to improve by 2-3 seconds Baseline:  Goal status: MET 04/28/24  5.  Patient to be able to sleep through the night  Baseline:  Goal status: MET 05/05/24  6.  Patient to report 85%  improvement in overall symptoms Baseline:  Goal status: In Progress  PLAN:  PT FREQUENCY: 1-2x/week  PT DURATION: 8 weeks  PLANNED INTERVENTIONS: 97110-Therapeutic exercises, 97530- Therapeutic activity, [id number]- Neuromuscular re-education, 97535- Self Care, [id number]- Manual therapy, [phone]- Gait training, [phone]- Canalith repositioning, [id number]- Aquatic Therapy, [phone]- Electrical stimulation (unattended), [phone]- Electrical stimulation (manual), [id number]- Ultrasound, [id number]- Traction (mechanical), [id number]- Ionotophoresis 4mg /ml Dexamethasone , Patient/Family education, Balance training, Stair training, Taping, Joint mobilization, Spinal mobilization, Vestibular training, DME instructions, Cryotherapy, and Moist heat.  PLAN FOR NEXT SESSION: Nustep, Dynamic balance, resistance training  Vienne Corcoran B. Jerae Izard, PT 06/18/2024 3:34 PM [facility] [address], [address] [place], [place] [address] Phone # [phone] Fax [phone]

## 2024-06-24 ENCOUNTER — Ambulatory Visit

## 2024-06-25 ENCOUNTER — Encounter: Payer: Self-pay | Admitting: Internal Medicine

## 2024-06-25 ENCOUNTER — Ambulatory Visit

## 2024-06-26 ENCOUNTER — Ambulatory Visit: Admitting: Internal Medicine

## 2024-06-30 ENCOUNTER — Ambulatory Visit

## 2024-06-30 DIAGNOSIS — M5459 Other low back pain: Secondary | ICD-10-CM

## 2024-06-30 DIAGNOSIS — R293 Abnormal posture: Secondary | ICD-10-CM

## 2024-06-30 DIAGNOSIS — M6281 Muscle weakness (generalized): Secondary | ICD-10-CM

## 2024-06-30 DIAGNOSIS — R262 Difficulty in walking, not elsewhere classified: Secondary | ICD-10-CM

## 2024-06-30 DIAGNOSIS — R252 Cramp and spasm: Secondary | ICD-10-CM

## 2024-06-30 NOTE — Therapy (Signed)
 " OUTPATIENT PHYSICAL THERAPY THORACOLUMBAR TREATMENT  Patient Name: Dominique Griffin MRN: [id number] DOB:May 23, 1951, 73 y.o., female Today's Date: 07/01/2024  END OF SESSION:  PT End of Session - 06/30/24 1239     Visit Number 18    Date for Recertification  07/13/24    Authorization Type [employer] Approved 16 visits-03/23/24-06/21/2024-auth#[id number]// 8 more approved 06/25/24 through 08/18/24    Authorization Time Period 12/18//25- 08/1024    Authorization - Visit Number 18    Authorization - Number of Visits 24    Progress Note Due on Visit 20    PT Start Time 1230    PT Stop Time 1315    PT Time Calculation (min) 45 min    Activity Tolerance Patient tolerated treatment well    Behavior During Therapy WFL for tasks assessed/performed             Past Medical History:  Diagnosis Date   Gilbert's syndrome    History of ITP    Osteopenia    Parkinson disease (HCC)    Past Surgical History:  Procedure Laterality Date   ABDOMINAL HYSTERECTOMY  1993   fibroids, ovaries remain   BREAST EXCISIONAL BIOPSY Left    skin bbiopsy   TONSILLECTOMY     Patient Active Problem List   Diagnosis Date Noted   Lumbar back pain with radiculopathy affecting left lower extremity 03/10/2024   Class 1 obesity due to excess calories without serious comorbidity with body mass index (BMI) of 30.0 to 30.9 in adult 12/15/2023   Gallstones 02/25/2023   B12 deficiency 01/07/2023   Dizziness - PPPD 10/24/2022   Chronic ITP (idiopathic thrombocytopenia) (HCC) 09/07/2022   Clinical trial exam 06/27/2021   Osteoporosis 11/13/2020   Vitamin D deficiency 11/13/2020   Diverticulosis of colon 11/13/2020   Family history of colon cancer 11/13/2020   Arthritis 09/27/2020   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   Coccyx pain 09/27/2020   Parkinson's disease - Dr Evonnie 12/02/2019    PCP: Geofm Glade PARAS, MD  REFERRING PROVIDER: Geofm Glade PARAS, MD  REFERRING DIAG: M54.9 (ICD-10-CM) - Back pain, unspecified back  location, unspecified back pain laterality, unspecified chronicity  Rationale for Evaluation and Treatment: Rehabilitation  THERAPY DIAG:  Other low back pain  Difficulty in walking, not elsewhere classified  Abnormal posture  Muscle weakness (generalized)  Cramp and spasm  ONSET DATE: 03/19/2024  SUBJECTIVE:                                                                                                                                                                                           SUBJECTIVE STATEMENT: Patient reports she was  sick for the past 2 weeks and has not been able to do much.  She has been to 2 of her upper level Parkinson's classes without any problems.  She denies any back pain with resuming this class.  She is well motivated and compliant.  She should continue to do well.      From Eval: Back pain since she was in her 40's. In her 50's fell from a ladder.  In her 55's, a lot of OA found.  In her 62's, diagnosed with Parkinsons.  Now very active doing [employer], multiple Parkinsons classes, Tai Chi, multiple stretching classes to try and deal with the parkinsons symptoms.  She was doing happy baby and felt pain in the low back flare up.  She was prescribed dose pack and other meds.  She hopes to eliminate the back pain so that she can continue working on controlling her Parkinson's symptoms.    PERTINENT HISTORY:  Parkinson's    PAIN:  112/01/25 Are you having pain? Yes: NPRS scale: 0/10 Pain location: Left lower back, left lateral LE, occasional left lateral knee Pain description: aching Aggravating factors: standing Relieving factors: exercise, meds  PRECAUTIONS: None  RED FLAGS: None   WEIGHT BEARING RESTRICTIONS: No  FALLS:  Has patient fallen in last 6 months? No  LIVING ENVIRONMENT: Lives with: lives with their family Lives in: House/apartment Stairs: Yes: Internal: 17 steps; on right going up and External: 5 steps; on right going up Has  following equipment at home: None  OCCUPATION: retired   PLOF: Independent, Independent with basic ADLs, Independent with household mobility without device, Independent with community mobility without device, Independent with homemaking with ambulation, Independent with gait, and Independent with transfers  PATIENT GOALS: To be able to continue to exercise to reduce her Parkinson's symptoms  NEXT MD VISIT: prn  OBJECTIVE:  Note: Objective measures were completed at Evaluation unless otherwise noted.  DIAGNOSTIC FINDINGS:  na  PATIENT SURVEYS:  Modified Oswestry:  MODIFIED OSWESTRY DISABILITY SCALE  Date: 03/23/24 Score 14/50     Date: 05/19/24 Score 6/50  Total    Interpretation of scores: Score Category Description  0-20% Minimal Disability The patient can cope with most living activities. Usually no treatment is indicated apart from advice on lifting, sitting and exercise  21-40% Moderate Disability The patient experiences more pain and difficulty with sitting, lifting and standing. Travel and social life are more difficult and they may be disabled from work. Personal care, sexual activity and sleeping are not grossly affected, and the patient can usually be managed by conservative means  41-60% Severe Disability Pain remains the main problem in this group, but activities of daily living are affected. These patients require a detailed investigation  61-80% Crippled Back pain impinges on all aspects of the patients life. Positive intervention is required  81-100% Bed-bound  These patients are either bed-bound or exaggerating their symptoms  [name], et al. Surgery versus conservative management of stable thoracolumbar fracture: the PRESTO feasibility RCT. [place] ([place]): [employer]; 2021 Nov. [facility] Technology Assessment, No. 25.62.) Appendix 3, Oswestry Disability Index category descriptors. Available from:  [email or web address].cz  Minimally Clinically Important Difference (MCID) = 12.8%  COGNITION: Overall cognitive status: Within functional limits for tasks assessed     SENSATION: Occasional pain down the left outer thigh and into the knee but appears to be referred vs radicular  MUSCLE LENGTH: Initial eval: Hamstrings: Right 55 deg; Left 50 deg Thomas test: Right pos;  Left pos  05/19/24: Hamstrings: Right 70 deg; Left 70 deg Thomas test: Right pos; Left pos   POSTURE: Scoliosis obvious, right trunk shift, even more pronounced lying down  PALPATION: Tender left IT band  LUMBAR ROM:   AROM eval 04/28/24  Flexion WNL WNL  Extension 50% 75%  Right lateral flexion WNL WNL  Left lateral flexion Fingertips to just above joint line WFL  Right rotation WNL WNL  Left rotation WNL WNL   (Blank rows = not tested)  LOWER EXTREMITY ROM:     WFL  LOWER EXTREMITY MMT:    Initial eval: Generally 4 to 4+/5 with exception of hip extension 3+ bilaterally, hip abduction 4- right hip, 3+ left hip  04/28/24: Improved to 4+/5 on all   LUMBAR SPECIAL TESTS:  Straight leg raise test: Negative, FABER test: Negative, and Thomas test: Positive  FUNCTIONAL TESTS:  Initial eval: 5 times sit to stand: 10.74 sec Timed up and go (TUG): 9.40 sec  04/28/24: 5 times sit to stand: 7.29 sec Timed up and go (TUG): 7.05 sec   04/28/24: 5 times sit to stand: 6.83 sec Timed up and go (TUG): 6.83 sec GAIT: Distance walked: 30 feet Assistive device utilized: None Level of assistance: Modified independence Comments: fairly normal heel to toe progression  TREATMENT DATE:  06/30/24 Nustep x 6 min level 5 Supine heel grabs 2 x 20 Supine PPT 2 x 10 PPT with 90/90 heel taps Supine PPT with dying bug unsupported x 20 Supine PPT with hand to opposite knee 2 x 10 each side with 4lbs (unsupported) Supine PPT with clamshell using black loop x 20 Supine PPT  bridge and  clamshell with black loop combo 2 x 10 Tail wags 2 x 20 (verbal cues to reach when going to left) Sit to stand x 10 holding 10 lb kb Squat to table x 10 holding 10 lb kb Around the [place] x 10 each way with 10 lb kb standing on balance pad Standing in tandem on balance pad : locomotives with 2 lb dumbbells 2 sets Step up and hold fwd and lateral x 10 each LE in each direction on balance pad  Lunge to BOSU  x 10 each LE fwd, then x 10 each LE lateral  06/18/2024 Nustep x 6 min level 5 Thoroughly went through all of HEP eliminating older, less challenging exercises, added some jump squats into sit to stand and squats to table Educated on how to section her program out into sitting, lying down, and standing throughout the day to avoid over fatigue and causing her Parkinson's symptoms to worsen.   06/08/2024 Nustep x 6 min level 5 Left IT band rolling x 1 min  Supine IT band stretch on left x 5 hold 10 sec Supine heel grabs 2 x 20 Supine PPT 2 x 10 Supine PPT with dying bug unsupported x 20 Supine PPT + march 2 x 20 bilateral vc's for maintaining tilt (unsupported) Supine PPT with hand to opposite knee 2 x 10 each side with 4lbs (unsupported) Supine PPT with clamshell using black loop x 20 Supine PPT  bridge and clamshell with black loop combo 2 x 10 Tail wags 2 x 20 (verbal cues to reach when going to left) Sit to stand x 10 holding 10 lb kb Squat to table x 10 holding 10 lb kb [place] worlds x 10 each way with 10 lb kb standing on balance pad Standing in tandem on balance pad : locomotives with 2  lb dumbbells 2 sets Step up and hold fwd and lateral x 10 each LE in each direction on balance pad  Lunge to BOSU  x 10 each LE fwd, then x 10 each LE lateral  05/19/24 Nustep x 5 min level 5 Re-assessment for recert and reauth Palloff press on balance pad at matrix with 15 lbs 2 x 10 each direction Chops with 15 lbs x 10 each side from up to down, then x 10 each side from bottom to  top Cross body punches with 2 lb dumbbell standing on balance pad x 20 Standing on balance pad with 2 lb dumbbells with arms overhead alternate hand to knee Seated Russian twist with feet unsupported 2 x 10 with 10 lb kb Seated shoulder to hip with feet unsupported 2 x 10 with 10 lb kb   PATIENT EDUCATION:  Education details: initiated HEP, Educated on proper sleeping posture and lumbar support, Educated on appropriate exercises and those to avoid to reduce stress to the lumbar spine Person educated: Patient Education method: Programmer, Multimedia, Facilities Manager, Verbal cues, and Handouts Education comprehension: verbalized understanding, returned demonstration, verbal cues required, and tactile cues required  HOME EXERCISE PROGRAM: Access Code: [email or web address] URL: [email or web address] Date: 06/18/2024 Prepared by: Delon Haddock  Exercises - Standing Hamstring Stretch on Chair  - 1 x daily - 7 x weekly - 1 sets - 3 reps - 30 sec hold - Quadricep Stretch with Chair and Counter Support  - 1 x daily - 7 x weekly - 1 sets - 3 reps - 30 sec hold - Single Leg Balance Raising Opposite Arm and Leg  - 1 x daily - 7 x weekly - 3 sets - 10 reps - Seated Figure 4 Piriformis Stretch  - 1 x daily - 7 x weekly - 1 sets - 3 reps - 30 sed hold - Seated Lateral Trunk Stretch on Swiss Ball  - 1 x daily - 7 x weekly - 1 sets - 10 reps - 5 sec hold - Supine 90/90 Alternating Heel Touches with Posterior Pelvic Tilt  - 1 x daily - 7 x weekly - 3 sets - 10 reps - Supine Dead Bug with Leg Extension  - 1 x daily - 7 x weekly - 3 sets - 10 reps - Clamshells with Resistance (BKA)  - 1 x daily - 7 x weekly - 3 sets - 10 reps - Hooklying Isometric Clamshell  - 1 x daily - 7 x weekly - 3 sets - 10 reps - Standing Balance Activity: Plyometric Squat to Jump  - 1 x daily - 7 x weekly - 1 sets - 10 reps - Squat Jumps  - 1 x daily - 7 x weekly - 3 sets - 10 reps - Sit to Stand Without Arm Support  - 1 x daily - 7 x  weekly - 1 sets - 10 reps - Squat with Chair Touch  - 1 x daily - 7 x weekly - 1 sets - 10 reps ASSESSMENT:  CLINICAL IMPRESSION: Lexy continues to do well.  Her tremors were noticeable today, most likely due to fatigue and recent illness.  However, she continues to do well on her balance activities.  She remains well motivated and compliant and should continue to do well.    OBJECTIVE IMPAIRMENTS: difficulty walking, decreased ROM, decreased strength, increased fascial restrictions, increased muscle spasms, impaired flexibility, improper body mechanics, postural dysfunction, and pain.   ACTIVITY LIMITATIONS: carrying, lifting, bending, standing, squatting, sleeping, stairs, transfers, bed mobility,  bathing, toileting, dressing, and caring for others  PARTICIPATION LIMITATIONS: meal prep, cleaning, laundry, driving, shopping, community activity, and yard work  PERSONAL FACTORS: Fitness and 1-2 comorbidities: Parkinsons and Gilberts syndrome are also affecting patient's functional outcome.   REHAB POTENTIAL: Good  CLINICAL DECISION MAKING: Evolving/moderate complexity  EVALUATION COMPLEXITY: Moderate   GOALS: Goals reviewed with patient? Yes  SHORT TERM GOALS: Target date: 04/20/2024  Pain report to be no greater than 4/10  Baseline: Goal status: MET 04/15/24  2.  Patient will be independent with initial HEP  Baseline:  Goal status: MET 04/17/24  3.  Patient to report 30% or better improvement in overall function Baseline:  Goal status: MET 04/29/24   LONG TERM GOALS: Target date: 05/18/2024  Patient to report pain no greater than 2/10  Baseline:  Goal status: MET 04/29/24  2.  Patient to be independent with advanced HEP  Baseline:  Goal status: MET 04/29/24  3.  Patients Oswestry score to improve by 3-5 points Baseline:  Goal status: In Progress  4.  Functional scores to improve by 2-3 seconds Baseline:  Goal status: MET 04/28/24  5.  Patient to be able to  sleep through the night  Baseline:  Goal status: MET 05/05/24  6.  Patient to report 85% improvement in overall symptoms Baseline:  Goal status: In Progress  PLAN:  PT FREQUENCY: 1-2x/week  PT DURATION: 8 weeks  PLANNED INTERVENTIONS: 97110-Therapeutic exercises, 97530- Therapeutic activity, [id number]- Neuromuscular re-education, 97535- Self Care, [id number]- Manual therapy, [phone]- Gait training, [phone]- Canalith repositioning, [id number]- Aquatic Therapy, [phone]- Electrical stimulation (unattended), [phone]- Electrical stimulation (manual), [id number]- Ultrasound, [id number]- Traction (mechanical), [id number]- Ionotophoresis 4mg /ml Dexamethasone , Patient/Family education, Balance training, Stair training, Taping, Joint mobilization, Spinal mobilization, Vestibular training, DME instructions, Cryotherapy, and Moist heat.  PLAN FOR NEXT SESSION: Nustep, Dynamic balance, resistance training  Peytyn Trine B. Estha Few, PT 07/01/2024 8:06 AM [facility] [address] [place], [place] [address] Phone # [phone] Fax [phone]  "

## 2024-07-07 ENCOUNTER — Ambulatory Visit

## 2024-07-07 DIAGNOSIS — R293 Abnormal posture: Secondary | ICD-10-CM

## 2024-07-07 DIAGNOSIS — M5459 Other low back pain: Secondary | ICD-10-CM | POA: Diagnosis not present

## 2024-07-07 DIAGNOSIS — R252 Cramp and spasm: Secondary | ICD-10-CM

## 2024-07-07 DIAGNOSIS — R262 Difficulty in walking, not elsewhere classified: Secondary | ICD-10-CM

## 2024-07-07 DIAGNOSIS — M6281 Muscle weakness (generalized): Secondary | ICD-10-CM

## 2024-07-07 NOTE — Therapy (Signed)
 " OUTPATIENT PHYSICAL THERAPY THORACOLUMBAR TREATMENT Recert and Progress Note Reporting Period 04/29/24 to 07/07/24  See note below for Objective Data and Assessment of Progress/Goals.      Patient Name: Dominique Griffin MRN: [id number] DOB:31-Mar-1951, 73 y.o., female Today's Date: 07/07/2024  END OF SESSION:  PT End of Session - 07/07/24 1154     Visit Number 19    Number of Visits 24    Date for Recertification  07/13/24    Authorization Type [employer] Approved 16 visits-03/23/24-06/21/2024-auth#[id number]// 8 more approved 06/25/24 through 08/18/24    Authorization Time Period 12/18//25- 08/18/24    Authorization - Visit Number 19    Authorization - Number of Visits 24    Progress Note Due on Visit 30    PT Start Time 1149    PT Stop Time 1230    PT Time Calculation (min) 41 min    Activity Tolerance Patient tolerated treatment well    Behavior During Therapy WFL for tasks assessed/performed             Past Medical History:  Diagnosis Date   Gilbert's syndrome    History of ITP    Osteopenia    Parkinson disease (HCC)    Past Surgical History:  Procedure Laterality Date   ABDOMINAL HYSTERECTOMY  1993   fibroids, ovaries remain   BREAST EXCISIONAL BIOPSY Left    skin bbiopsy   TONSILLECTOMY     Patient Active Problem List   Diagnosis Date Noted   Lumbar back pain with radiculopathy affecting left lower extremity 03/10/2024   Class 1 obesity due to excess calories without serious comorbidity with body mass index (BMI) of 30.0 to 30.9 in adult 12/15/2023   Gallstones 02/25/2023   B12 deficiency 01/07/2023   Dizziness - PPPD 10/24/2022   Chronic ITP (idiopathic thrombocytopenia) (HCC) 09/07/2022   Clinical trial exam 06/27/2021   Osteoporosis 11/13/2020   Vitamin D deficiency 11/13/2020   Diverticulosis of colon 11/13/2020   Family history of colon cancer 11/13/2020   Arthritis 09/27/2020   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   Coccyx pain 09/27/2020   Parkinson's  disease - Dr Evonnie 12/02/2019    PCP: Geofm Glade PARAS, MD  REFERRING PROVIDER: Geofm Glade PARAS, MD  REFERRING DIAG: M54.9 (ICD-10-CM) - Back pain, unspecified back location, unspecified back pain laterality, unspecified chronicity  Rationale for Evaluation and Treatment: Rehabilitation  THERAPY DIAG:  Other low back pain - Plan: PT plan of care cert/re-cert  Difficulty in walking, not elsewhere classified - Plan: PT plan of care cert/re-cert  Abnormal posture - Plan: PT plan of care cert/re-cert  Muscle weakness (generalized) - Plan: PT plan of care cert/re-cert  Cramp and spasm - Plan: PT plan of care cert/re-cert  ONSET DATE: 03/19/2024  SUBJECTIVE:  SUBJECTIVE STATEMENT: Patient reports she is doing much better.  She has not returned to [facility] but feels confident she will be able to do this with modifications.       From Eval: Back pain since she was in her 40's. In her 50's fell from a ladder.  In her 37's, a lot of OA found.  In her 73's, diagnosed with Parkinsons.  Now very active doing [employer], multiple Parkinsons classes, Tai Chi, multiple stretching classes to try and deal with the parkinsons symptoms.  She was doing happy baby and felt pain in the low back flare up.  She was prescribed dose pack and other meds.  She hopes to eliminate the back pain so that she can continue working on controlling her Parkinson's symptoms.    PERTINENT HISTORY:  Parkinson's    PAIN:  07/07/24 Are you having pain? Yes: NPRS scale: 1/10 Pain location: Left lower back, left lateral LE, occasional left lateral knee Pain description: aching Aggravating factors: standing Relieving factors: exercise, meds  PRECAUTIONS: None  RED FLAGS: None   WEIGHT BEARING RESTRICTIONS: No  FALLS:  Has patient  fallen in last 6 months? No  LIVING ENVIRONMENT: Lives with: lives with their family Lives in: House/apartment Stairs: Yes: Internal: 17 steps; on right going up and External: 5 steps; on right going up Has following equipment at home: None  OCCUPATION: retired   PLOF: Independent, Independent with basic ADLs, Independent with household mobility without device, Independent with community mobility without device, Independent with homemaking with ambulation, Independent with gait, and Independent with transfers  PATIENT GOALS: To be able to continue to exercise to reduce her Parkinson's symptoms  NEXT MD VISIT: prn  OBJECTIVE:  Note: Objective measures were completed at Evaluation unless otherwise noted.  DIAGNOSTIC FINDINGS:  na  PATIENT SURVEYS:  Modified Oswestry:  MODIFIED OSWESTRY DISABILITY SCALE  Date: 03/23/24 Score 14/50     Date: 05/19/24 Score 6/50  Date: 07/07/24 Score: 3/50  Total    Interpretation of scores: Score Category Description  0-20% Minimal Disability The patient can cope with most living activities. Usually no treatment is indicated apart from advice on lifting, sitting and exercise  21-40% Moderate Disability The patient experiences more pain and difficulty with sitting, lifting and standing. Travel and social life are more difficult and they may be disabled from work. Personal care, sexual activity and sleeping are not grossly affected, and the patient can usually be managed by conservative means  41-60% Severe Disability Pain remains the main problem in this group, but activities of daily living are affected. These patients require a detailed investigation  61-80% Crippled Back pain impinges on all aspects of the patients life. Positive intervention is required  81-100% Bed-bound  These patients are either bed-bound or exaggerating their symptoms  [name], et al. Surgery versus conservative management of stable thoracolumbar  fracture: the PRESTO feasibility RCT. [place] ([place]): [employer]; 2021 Nov. [facility] Technology Assessment, No. 25.62.) Appendix 3, Oswestry Disability Index category descriptors. Available from: Findjewelers.cz  Minimally Clinically Important Difference (MCID) = 12.8%  COGNITION: Overall cognitive status: Within functional limits for tasks assessed     SENSATION: Occasional pain down the left outer thigh and into the knee but appears to be referred vs radicular  MUSCLE LENGTH: Initial eval: Hamstrings: Right 55 deg; Left 50 deg Thomas test: Right pos; Left pos  05/19/24: Hamstrings: Right 70 deg; Left 70 deg Thomas test: Right pos; Left pos  POSTURE: Scoliosis obvious, right trunk shift, even more pronounced lying down  PALPATION: Tender left IT band  LUMBAR ROM:   AROM eval 04/28/24 07/07/24  Flexion WNL WNL WNL  Extension 50% 75% WNL with slight discomfort  Right lateral flexion WNL WNL WNL  Left lateral flexion Fingertips to just above joint line WFL WNL  Right rotation WNL WNL WNL  Left rotation WNL WNL WNL   (Blank rows = not tested)  LOWER EXTREMITY ROM:     WFL  LOWER EXTREMITY MMT:    Initial eval: Generally 4 to 4+/5 with exception of hip extension 3+ bilaterally, hip abduction 4- right hip, 3+ left hip  04/28/24: Improved to 4+/5 on all   LUMBAR SPECIAL TESTS:  Straight leg raise test: Negative, FABER test: Negative, and Thomas test: Positive  FUNCTIONAL TESTS:  Initial eval: 5 times sit to stand: 10.74 sec Timed up and go (TUG): 9.40 sec  04/28/24: 5 times sit to stand: 7.29 sec Timed up and go (TUG): 7.05 sec   06/08/24: 5 times sit to stand: 6.83 sec Timed up and go (TUG): 6.83 sec  07/07/24: 5 times sit to stand: 7.87 sec Timed up and go (TUG): 6.75 sec GAIT: Distance walked: 30 feet Assistive device utilized: None Level of assistance: Modified independence Comments: fairly normal heel to  toe progression  TREATMENT DATE:  07/07/24 Nustep x 6 min level 5 20th visit and recert assessment completed Supine heel grabs 2 x 20 Supine PPT 2 x 10 PPT with 90/90 heel taps Supine PPT with dying bug unsupported x 20 Supine PPT with hand to opposite knee 2 x 10 each side with 4lbs (unsupported) Tail wags 2 x 20 (verbal cues to reach when going to left) Around the worlds x 10 each way with 10 lb kb standing on balance pad Squats with 10 lb kb on balance pad  Standing in tandem on balance pad : locomotives with 2 lb dumbbells 2 sets Step up and hold fwd and lateral x 10 each LE in each direction on balance pad  Alternating toe tap ups on mat table x 20 Lunge to BOSU  x 10 each LE fwd, then x 10 each LE lateral  06/30/24 Nustep x 6 min level 5 Supine heel grabs 2 x 20 Supine PPT 2 x 10 PPT with 90/90 heel taps Supine PPT with dying bug unsupported x 20 Supine PPT with hand to opposite knee 2 x 10 each side with 4lbs (unsupported) Supine PPT with clamshell using black loop x 20 Supine PPT  bridge and clamshell with black loop combo 2 x 10 Tail wags 2 x 20 (verbal cues to reach when going to left) Sit to stand x 10 holding 10 lb kb Squat to table x 10 holding 10 lb kb Around the [place] x 10 each way with 10 lb kb standing on balance pad Standing in tandem on balance pad : locomotives with 2 lb dumbbells 2 sets Step up and hold fwd and lateral x 10 each LE in each direction on balance pad  Lunge to BOSU  x 10 each LE fwd, then x 10 each LE lateral  PATIENT EDUCATION:  Education details: initiated HEP, Educated on proper sleeping posture and lumbar support, Educated on appropriate exercises and those to avoid to reduce stress to the lumbar spine Person educated: Patient Education method: Programmer, Multimedia, Facilities Manager, Verbal cues, and Handouts Education comprehension: verbalized understanding, returned demonstration, verbal cues required, and tactile cues required  HOME EXERCISE  PROGRAM: Access Code: [email or web address] URL: [email or web address] Date: 06/18/2024 Prepared by: Delon Haddock  Exercises - Standing Hamstring Stretch on Chair  - 1 x daily - 7 x weekly - 1 sets - 3 reps - 30 sec hold - Quadricep Stretch with Chair and Counter Support  - 1 x daily - 7 x weekly - 1 sets - 3 reps - 30 sec hold - Single Leg Balance Raising Opposite Arm and Leg  - 1 x daily - 7 x weekly - 3 sets - 10 reps - Seated Figure 4 Piriformis Stretch  - 1 x daily - 7 x weekly - 1 sets - 3 reps - 30 sed hold - Seated Lateral Trunk Stretch on Swiss Ball  - 1 x daily - 7 x weekly - 1 sets - 10 reps - 5 sec hold - Supine 90/90 Alternating Heel Touches with Posterior Pelvic Tilt  - 1 x daily - 7 x weekly - 3 sets - 10 reps - Supine Dead Bug with Leg Extension  - 1 x daily - 7 x weekly - 3 sets - 10 reps - Clamshells with Resistance (BKA)  - 1 x daily - 7 x weekly - 3 sets - 10 reps - Hooklying Isometric Clamshell  - 1 x daily - 7 x weekly - 3 sets - 10 reps - Standing Balance Activity: Plyometric Squat to Jump  - 1 x daily - 7 x weekly - 1 sets - 10 reps - Squat Jumps  - 1 x daily - 7 x weekly - 3 sets - 10 reps - Sit to Stand Without Arm Support  - 1 x daily - 7 x weekly - 1 sets - 10 reps - Squat with Chair Touch  - 1 x daily - 7 x weekly - 1 sets - 10 reps ASSESSMENT:  CLINICAL IMPRESSION: Sheva is finishing her final few visits.  We are focusing on creating a clear DC plan with challenging tasks that she will also be able to incorporate into her [employer] as well. She has no pain to speak of.    She remains well motivated and compliant and should continue to do well.  She would benefit from completing her final few visits to meet final goals below.   OBJECTIVE IMPAIRMENTS: difficulty walking, decreased ROM, decreased strength, increased fascial restrictions, increased muscle spasms, impaired flexibility, improper body mechanics, postural dysfunction, and pain.   ACTIVITY  LIMITATIONS: carrying, lifting, bending, standing, squatting, sleeping, stairs, transfers, bed mobility, bathing, toileting, dressing, and caring for others  PARTICIPATION LIMITATIONS: meal prep, cleaning, laundry, driving, shopping, community activity, and yard work  PERSONAL FACTORS: Fitness and 1-2 comorbidities: Parkinsons and Gilberts syndrome are also affecting patient's functional outcome.   REHAB POTENTIAL: Good  CLINICAL DECISION MAKING: Evolving/moderate complexity  EVALUATION COMPLEXITY: Moderate   GOALS: Goals reviewed with patient? Yes  SHORT TERM GOALS: Target date: 04/20/2024  Pain report to be no greater than 4/10  Baseline: Goal status: MET 04/15/24  2.  Patient will be independent with initial HEP  Baseline:  Goal status: MET 04/17/24  3.  Patient to report 30% or better improvement in overall function Baseline:  Goal status: MET 04/29/24   LONG TERM GOALS: Target date: 08/18/2024  Patient to report pain no greater than 2/10  Baseline:  Goal status: MET 04/29/24  2.  Patient to be independent with advanced HEP  Baseline:  Goal status: MET 04/29/24  3.  Patients Oswestry score to improve by 3-5 points Baseline:  Goal status: In Progress  4.  Functional scores to improve by 2-3 seconds Baseline:  Goal status: MET 04/28/24  5.  Patient to be able to sleep through the night  Baseline:  Goal status: MET 05/05/24  6.  Patient to report 85% improvement in overall symptoms Baseline:  Goal status: In Progress  PLAN:  PT FREQUENCY: 1-2x/week  PT DURATION: 8 weeks  PLANNED INTERVENTIONS: 97110-Therapeutic exercises, 97530- Therapeutic activity, [id number]- Neuromuscular re-education, 97535- Self Care, [id number]- Manual therapy, [phone]- Gait training, [phone]- Canalith repositioning, [id number]- Aquatic Therapy, [phone]- Electrical stimulation (unattended), [id number]- Electrical stimulation (manual), [id number]- Ultrasound, [id number]- Traction (mechanical), [id number]- Ionotophoresis  4mg /ml Dexamethasone , Patient/Family education, Balance training, Stair training, Taping, Joint mobilization, Spinal mobilization, Vestibular training, DME instructions, Cryotherapy, and Moist heat.  PLAN FOR NEXT SESSION: Nustep, Dynamic balance, resistance training, balance training  Laraina Sulton B. Qiana Landgrebe, PT 07/07/2024 5:27 PM [facility] [address] [place], [place] [address] Phone # [phone] Fax [phone]  "

## 2024-07-15 ENCOUNTER — Ambulatory Visit

## 2024-07-16 ENCOUNTER — Ambulatory Visit: Attending: Internal Medicine

## 2024-07-16 DIAGNOSIS — R262 Difficulty in walking, not elsewhere classified: Secondary | ICD-10-CM | POA: Diagnosis present

## 2024-07-16 DIAGNOSIS — M5459 Other low back pain: Secondary | ICD-10-CM | POA: Diagnosis present

## 2024-07-16 DIAGNOSIS — R252 Cramp and spasm: Secondary | ICD-10-CM | POA: Diagnosis present

## 2024-07-16 DIAGNOSIS — M6281 Muscle weakness (generalized): Secondary | ICD-10-CM | POA: Diagnosis present

## 2024-07-16 DIAGNOSIS — R293 Abnormal posture: Secondary | ICD-10-CM | POA: Diagnosis present

## 2024-07-16 NOTE — Therapy (Signed)
 " OUTPATIENT PHYSICAL THERAPY THORACOLUMBAR TREATMENT Recert and Progress Note Reporting Period 04/29/24 to 07/07/24  See note below for Objective Data and Assessment of Progress/Goals.      Patient Name: Dominique Griffin MRN: [id number] DOB:[birth year], 74 y.o., female Today's Date: 07/16/2024  END OF SESSION:  PT End of Session - 07/16/24 1155     Visit Number 20    Number of Visits 24    Date for Recertification  08/18/24    Authorization Type [employer] Approved 16 visits-03/23/24-06/21/2024-auth#[id number]// 8 more approved 06/25/24 through 08/18/24    Authorization Time Period 12/18//25- 08/18/24    Authorization - Visit Number 20    Authorization - Number of Visits 24    Progress Note Due on Visit 30    PT Start Time 1147    PT Stop Time 1227    PT Time Calculation (min) 40 min    Activity Tolerance Patient tolerated treatment well    Behavior During Therapy WFL for tasks assessed/performed             Past Medical History:  Diagnosis Date   Gilbert's syndrome    History of ITP    Osteopenia    Parkinson disease (HCC)    Past Surgical History:  Procedure Laterality Date   ABDOMINAL HYSTERECTOMY  1993   fibroids, ovaries remain   BREAST EXCISIONAL BIOPSY Left    skin bbiopsy   TONSILLECTOMY     Patient Active Problem List   Diagnosis Date Noted   Lumbar back pain with radiculopathy affecting left lower extremity 03/10/2024   Class 1 obesity due to excess calories without serious comorbidity with body mass index (BMI) of 30.0 to 30.9 in adult 12/15/2023   Gallstones 02/25/2023   B12 deficiency 01/07/2023   Dizziness - PPPD 10/24/2022   Chronic ITP (idiopathic thrombocytopenia) (HCC) 09/07/2022   Clinical trial exam 06/27/2021   Osteoporosis 11/13/2020   Vitamin D deficiency 11/13/2020   Diverticulosis of colon 11/13/2020   Family history of colon cancer 11/13/2020   Arthritis 09/27/2020   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  Doing well.     From Eval: Back pain since she was in her 40's. In her 50's fell from a ladder.  In her 65's, a lot of OA found.  In her 62's, diagnosed with Parkinsons.  Now very active doing [employer], multiple Parkinsons classes, Tai Chi, multiple stretching classes to try and deal with the parkinsons symptoms.  She was doing happy baby and felt pain in the low back flare up.  She was prescribed dose pack and other meds.  She hopes to eliminate the back pain so that she can continue working on controlling her Parkinson's symptoms.    PERTINENT HISTORY:  Parkinson's    PAIN:  07/16/24 Are you having pain? Yes: NPRS scale: 0/10 Pain location: Left lower back, left lateral LE, occasional left lateral knee Pain description: aching Aggravating factors: standing Relieving factors: exercise, meds  PRECAUTIONS: None  RED FLAGS: None   WEIGHT BEARING RESTRICTIONS: No  FALLS:  Has patient fallen in last 6 months? No  LIVING ENVIRONMENT: Lives with: lives with their family Lives in: House/apartment Stairs: Yes: Internal: 17 steps; on right going up and External: 5 steps; on right going up Has following equipment at home: None  OCCUPATION: retired   PLOF: Independent,  Independent with basic ADLs, Independent with household mobility without device, Independent with community mobility without device, Independent with homemaking with ambulation, Independent with gait, and Independent with transfers  PATIENT GOALS: To be able to continue to exercise to reduce her Parkinson's symptoms  NEXT MD VISIT: prn  OBJECTIVE:  Note: Objective measures were completed at Evaluation unless otherwise noted.  DIAGNOSTIC FINDINGS:  na  PATIENT SURVEYS:  Modified Oswestry:  MODIFIED OSWESTRY DISABILITY SCALE  Date: 03/23/24 Score 14/50     Date: 05/19/24 Score 6/50  Date: 07/07/24 Score: 3/50  Total    Interpretation of scores: Score Category Description  0-20% Minimal Disability The patient can cope with most living activities. Usually no treatment is indicated apart from advice on lifting, sitting and exercise  21-40% Moderate Disability The patient experiences more pain and difficulty with sitting, lifting and standing. Travel and social life are more difficult and they may be disabled from work. Personal care, sexual activity and sleeping are not grossly affected, and the patient can usually be managed by conservative means  41-60% Severe Disability Pain remains the main problem in this group, but activities of daily living are affected. These patients require a detailed investigation  61-80% Crippled Back pain impinges on all aspects of the patients life. Positive intervention is required  81-100% Bed-bound  These patients are either bed-bound or exaggerating their symptoms  [name], et al. Surgery versus conservative management of stable thoracolumbar fracture: the PRESTO feasibility RCT. [place] ([place]): [employer]; 2021 Nov. [facility] Technology Assessment, No. 25.62.) Appendix 3, Oswestry Disability Index category descriptors. Available from: Findjewelers.cz  Minimally Clinically Important Difference  (MCID) = 12.8%  COGNITION: Overall cognitive status: Within functional limits for tasks assessed     SENSATION: Occasional pain down the left outer thigh and into the knee but appears to be referred vs radicular  MUSCLE LENGTH: Initial eval: Hamstrings: Right 55 deg; Left 50 deg Thomas test: Right pos; Left pos  05/19/24: Hamstrings: Right 70 deg; Left 70 deg Thomas test: Right pos; Left pos   POSTURE: Scoliosis obvious, right trunk shift, even more pronounced lying down  PALPATION: Tender left IT band  LUMBAR ROM:  AROM eval 04/28/24 07/07/24  Flexion WNL WNL WNL  Extension 50% 75% WNL with slight discomfort  Right lateral flexion WNL WNL WNL  Left lateral flexion Fingertips to just above joint line WFL WNL  Right rotation WNL WNL WNL  Left rotation WNL WNL WNL   (Blank rows = not tested)  LOWER EXTREMITY ROM:     WFL  LOWER EXTREMITY MMT:    Initial eval: Generally 4 to 4+/5 with exception of hip extension 3+ bilaterally, hip abduction 4- right hip, 3+ left hip  04/28/24: Improved to 4+/5 on all   LUMBAR SPECIAL TESTS:  Straight leg raise test: Negative, FABER test: Negative, and Thomas test: Positive  FUNCTIONAL TESTS:  Initial eval: 5 times sit to stand: 10.74 sec Timed up and go (TUG): 9.40 sec  04/28/24: 5 times sit to stand: 7.29 sec Timed up and go (TUG): 7.05 sec   06/08/24: 5 times sit to stand: 6.83 sec Timed up and go (TUG): 6.83 sec  07/07/24: 5 times sit to stand: 7.87 sec Timed up and go (TUG): 6.75 sec GAIT: Distance walked: 30 feet Assistive device utilized: None Level of assistance: Modified independence Comments: fairly normal heel to toe progression  TREATMENT DATE:  07/16/24 Nustep x 6 min level 5 Star drill (5 point) x 5 on each LE 3 point leg swing on balance pad x 5 each LE March on balance pad x 20  Squats on balance pad 2 x 10 Around the worlds on balance pad x 10 each direction with 10 lb kb Alternating toe tap  ups on mat table x 20 6 step up 2 x 10 each LE Left foot on step : right lateral trunk stretch x 5 holding 19 sec each Left foot on step: right UE bottoms up press with 5 lb kb 2 x 10 Right foot on step: left UE bottoms up press with 5 lb kb 2 x 10 Resisted walking: fwd, back, lateral stepping each side x 5 each direction (5 steps) Lunge to BOSU  x 10 each LE fwd, then x 10 each LE lateral Standing in tandem on balance pad : locomotives with 2 lb dumbbells   07/07/24 Nustep x 6 min level 5 20th visit and recert assessment completed Supine heel grabs 2 x 20 Supine PPT 2 x 10 PPT with 90/90 heel taps Supine PPT with dying bug unsupported x 20 Supine PPT with hand to opposite knee 2 x 10 each side with 4lbs (unsupported) Tail wags 2 x 20 (verbal cues to reach when going to left) Around the worlds x 10 each way with 10 lb kb standing on balance pad Squats with 10 lb kb on balance pad  Standing in tandem on balance pad : locomotives with 2 lb dumbbells 2 sets Step up and hold fwd and lateral x 10 each LE in each direction on balance pad  Alternating toe tap ups on mat table x 20 Lunge to BOSU  x 10 each LE fwd, then x 10 each LE lateral  06/30/24 Nustep x 6 min level 5 Supine heel grabs 2 x 20 Supine PPT 2 x 10 PPT with 90/90 heel taps Supine PPT with dying bug unsupported x 20 Supine PPT with hand to opposite knee 2 x 10 each side with 4lbs (unsupported) Supine PPT with clamshell using black loop x 20 Supine PPT  bridge and clamshell with black loop combo 2 x 10 Tail wags 2 x 20 (verbal cues to reach when going to  left) Sit to stand x 10 holding 10 lb kb Squat to table x 10 holding 10 lb kb Around the worlds x 10 each way with 10 lb kb standing on balance pad Standing in tandem on balance pad : locomotives with 2 lb dumbbells 2 sets Step up and hold fwd and lateral x 10 each LE in each direction on balance pad  Lunge to BOSU  x 10 each LE fwd, then x 10 each LE lateral  PATIENT  EDUCATION:  Education details: initiated HEP, Educated on proper sleeping posture and lumbar support, Educated on appropriate exercises and those to avoid to reduce stress to the lumbar spine Person educated: Patient Education method: Programmer, Multimedia, Facilities Manager, Verbal cues, and Handouts Education comprehension: verbalized understanding, returned demonstration, verbal cues required, and tactile cues required  HOME EXERCISE PROGRAM: Access Code: [email or web address] URL: [email or web address] Date: 06/18/2024 Prepared by: Delon Haddock  Exercises - Standing Hamstring Stretch on Chair  - 1 x daily - 7 x weekly - 1 sets - 3 reps - 30 sec hold - Quadricep Stretch with Chair and Counter Support  - 1 x daily - 7 x weekly - 1 sets - 3 reps - 30 sec hold - Single Leg Balance Raising Opposite Arm and Leg  - 1 x daily - 7 x weekly - 3 sets - 10 reps - Seated Figure 4 Piriformis Stretch  - 1 x daily - 7 x weekly - 1 sets - 3 reps - 30 sed hold - Seated Lateral Trunk Stretch on Swiss Ball  - 1 x daily - 7 x weekly - 1 sets - 10 reps - 5 sec hold - Supine 90/90 Alternating Heel Touches with Posterior Pelvic Tilt  - 1 x daily - 7 x weekly - 3 sets - 10 reps - Supine Dead Bug with Leg Extension  - 1 x daily - 7 x weekly - 3 sets - 10 reps - Clamshells with Resistance (BKA)  - 1 x daily - 7 x weekly - 3 sets - 10 reps - Hooklying Isometric Clamshell  - 1 x daily - 7 x weekly - 3 sets - 10 reps - Standing Balance Activity: Plyometric Squat to Jump  - 1 x daily - 7 x weekly - 1 sets - 10 reps - Squat Jumps  - 1 x daily - 7 x weekly - 3 sets - 10 reps - Sit to Stand Without Arm Support  - 1 x daily - 7 x weekly - 1 sets - 10 reps - Squat with Chair Touch  - 1 x daily - 7 x weekly - 1 sets - 10 reps ASSESSMENT:  CLINICAL IMPRESSION: Ashlay continues to meet every challenge with determination. She has no pain to speak of.  She does have continued scoliosis that is more pronounced when she is fatigued.     She remains well motivated and compliant and should continue to do well.  She would benefit from completing her final few visits to meet final goals below.   OBJECTIVE IMPAIRMENTS: difficulty walking, decreased ROM, decreased strength, increased fascial restrictions, increased muscle spasms, impaired flexibility, improper body mechanics, postural dysfunction, and pain.   ACTIVITY LIMITATIONS: carrying, lifting, bending, standing, squatting, sleeping, stairs, transfers, bed mobility, bathing, toileting, dressing, and caring for others  PARTICIPATION LIMITATIONS: meal prep, cleaning, laundry, driving, shopping, community activity, and yard work  PERSONAL FACTORS: Fitness and 1-2 comorbidities: Parkinsons and Gilberts syndrome are also affecting patient's functional outcome.   REHAB POTENTIAL: Good  CLINICAL DECISION  MAKING: Evolving/moderate complexity  EVALUATION COMPLEXITY: Moderate   GOALS: Goals reviewed with patient? Yes  SHORT TERM GOALS: Target date: 04/20/2024  Pain report to be no greater than 4/10  Baseline: Goal status: MET 04/15/24  2.  Patient will be independent with initial HEP  Baseline:  Goal status: MET 04/17/24  3.  Patient to report 30% or better improvement in overall function Baseline:  Goal status: MET 04/29/24   LONG TERM GOALS: Target date: 08/18/2024  Patient to report pain no greater than 2/10  Baseline:  Goal status: MET 04/29/24  2.  Patient to be independent with advanced HEP  Baseline:  Goal status: MET 04/29/24  3.  Patients Oswestry score to improve by 3-5 points Baseline:  Goal status: In Progress  4.  Functional scores to improve by 2-3 seconds Baseline:  Goal status: MET 04/28/24  5.  Patient to be able to sleep through the night  Baseline:  Goal status: MET 05/05/24  6.  Patient to report 85% improvement in overall symptoms Baseline:  Goal status: In Progress  PLAN:  PT FREQUENCY: 1-2x/week  PT DURATION: 8  weeks  PLANNED INTERVENTIONS: 97110-Therapeutic exercises, 97530- Therapeutic activity, [id number]- Neuromuscular re-education, 97535- Self Care, [id number]- Manual therapy, [phone]- Gait training, [phone]- Canalith repositioning, [id number]- Aquatic Therapy, [phone]- Electrical stimulation (unattended), [id number]- Electrical stimulation (manual), [id number]- Ultrasound, [id number]- Traction (mechanical), [id number]- Ionotophoresis 4mg /ml Dexamethasone , Patient/Family education, Balance training, Stair training, Taping, Joint mobilization, Spinal mobilization, Vestibular training, DME instructions, Cryotherapy, and Moist heat.  PLAN FOR NEXT SESSION: Nustep, Dynamic balance, resistance training, balance training  Noma Quijas B. Batu Cassin, PT 07/16/2024 12:31 PM [facility] [address], [address] [place], [place] [address] Phone # [phone] Fax [phone]  "

## 2024-07-23 ENCOUNTER — Ambulatory Visit

## 2024-07-23 DIAGNOSIS — M5459 Other low back pain: Secondary | ICD-10-CM | POA: Diagnosis not present

## 2024-07-23 DIAGNOSIS — R293 Abnormal posture: Secondary | ICD-10-CM

## 2024-07-23 DIAGNOSIS — R262 Difficulty in walking, not elsewhere classified: Secondary | ICD-10-CM

## 2024-07-23 DIAGNOSIS — R252 Cramp and spasm: Secondary | ICD-10-CM

## 2024-07-23 DIAGNOSIS — M6281 Muscle weakness (generalized): Secondary | ICD-10-CM

## 2024-07-23 NOTE — Therapy (Signed)
 " OUTPATIENT PHYSICAL THERAPY THORACOLUMBAR TREATMENT Recert and Progress Note Reporting Period 04/29/24 to 07/07/24  See note below for Objective Data and Assessment of Progress/Goals.      Patient Name: Dominique Griffin MRN: [id number] DOB:[birth year], 74 y.o., female Today's Date: 07/23/2024  END OF SESSION:  PT End of Session - 07/23/24 1502     Visit Number 21    Number of Visits 24    Date for Recertification  08/18/24    Authorization Type [employer] Approved 16 visits-03/23/24-06/21/2024-auth#[id number]// 8 more approved 06/25/24 through 08/18/24    Authorization Time Period 12/18//25- 08/18/24    Authorization - Visit Number 21    Authorization - Number of Visits 24    Progress Note Due on Visit 30    PT Start Time 1450    Activity Tolerance Patient tolerated treatment well    Behavior During Therapy [facility] for tasks assessed/performed             Past Medical History:  Diagnosis Date   Gilbert's syndrome    History of ITP    Osteopenia    Parkinson disease (HCC)    Past Surgical History:  Procedure Laterality Date   ABDOMINAL HYSTERECTOMY  1993   fibroids, ovaries remain   BREAST EXCISIONAL BIOPSY Left    skin bbiopsy   TONSILLECTOMY     Patient Active Problem List   Diagnosis Date Noted   Lumbar back pain with radiculopathy affecting left lower extremity 03/10/2024   Class 1 obesity due to excess calories without serious comorbidity with body mass index (BMI) of 30.0 to 30.9 in adult 12/15/2023   Gallstones 02/25/2023   B12 deficiency 01/07/2023   Dizziness - PPPD 10/24/2022   Chronic ITP (idiopathic thrombocytopenia) (HCC) 09/07/2022   Clinical trial exam 06/27/2021   Osteoporosis 11/13/2020   Vitamin D deficiency 11/13/2020   Diverticulosis of colon 11/13/2020   Family history of colon cancer 11/13/2020   Arthritis 09/27/2020   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   Coccyx pain 09/27/2020   Parkinson's disease - Dr Evonnie 12/02/2019    PCP: Geofm Glade PARAS,  MD  REFERRING PROVIDER: Geofm Glade PARAS, MD  REFERRING DIAG: M54.9 (ICD-10-CM) - Back pain, unspecified back location, unspecified back pain laterality, unspecified chronicity  Rationale for Evaluation and Treatment: Rehabilitation  THERAPY DIAG:  Other low back pain  Difficulty in walking, not elsewhere classified  Abnormal posture  Muscle weakness (generalized)  Cramp and spasm  ONSET DATE: 03/19/2024  SUBJECTIVE:  SUBJECTIVE STATEMENT: Patient reports I have been a little more sore than usual in the past week or so.  I've been doing more classes. I'm a little light headed today.  The doctor changed my Parkinsons medication.  BP : 155/94, HR: 80     From Eval: Back pain since she was in her 40's. In her 50's fell from a ladder.  In her 31's, a lot of OA found.  In her 44's, diagnosed with Parkinsons.  Now very active doing [employer], multiple Parkinsons classes, Tai Chi, multiple stretching classes to try and deal with the parkinsons symptoms.  She was doing happy baby and felt pain in the low back flare up.  She was prescribed dose pack and other meds.  She hopes to eliminate the back pain so that she can continue working on controlling her Parkinson's symptoms.    PERTINENT HISTORY:  Parkinson's    PAIN:  07/23/24 Are you having pain? Yes: NPRS scale: 0/10 Pain location: Left lower back, left lateral LE, occasional left lateral knee Pain description: aching Aggravating factors: standing Relieving factors: exercise, meds  PRECAUTIONS: None  RED FLAGS: None   WEIGHT BEARING RESTRICTIONS: No  FALLS:  Has patient fallen in last 6 months? No  LIVING ENVIRONMENT: Lives with: lives with their family Lives in: House/apartment Stairs: Yes: Internal: 17 steps; on right going up and  External: 5 steps; on right going up Has following equipment at home: None  OCCUPATION: retired   PLOF: Independent, Independent with basic ADLs, Independent with household mobility without device, Independent with community mobility without device, Independent with homemaking with ambulation, Independent with gait, and Independent with transfers  PATIENT GOALS: To be able to continue to exercise to reduce her Parkinson's symptoms  NEXT MD VISIT: prn  OBJECTIVE:  Note: Objective measures were completed at Evaluation unless otherwise noted.  DIAGNOSTIC FINDINGS:  na  PATIENT SURVEYS:  Modified Oswestry:  MODIFIED OSWESTRY DISABILITY SCALE  Date: 03/23/24 Score 14/50     Date: 05/19/24 Score 6/50  Date: 07/07/24 Score: 3/50  Total    Interpretation of scores: Score Category Description  0-20% Minimal Disability The patient can cope with most living activities. Usually no treatment is indicated apart from advice on lifting, sitting and exercise  21-40% Moderate Disability The patient experiences more pain and difficulty with sitting, lifting and standing. Travel and social life are more difficult and they may be disabled from work. Personal care, sexual activity and sleeping are not grossly affected, and the patient can usually be managed by conservative means  41-60% Severe Disability Pain remains the main problem in this group, but activities of daily living are affected. These patients require a detailed investigation  61-80% Crippled Back pain impinges on all aspects of the patients life. Positive intervention is required  81-100% Bed-bound  These patients are either bed-bound or exaggerating their symptoms  [name], et al. Surgery versus conservative management of stable thoracolumbar fracture: the PRESTO feasibility RCT. [place] ([place]): [employer]; 2021 Nov. [facility] Technology Assessment, No. 25.62.) Appendix 3, Oswestry Disability Index  category descriptors. Available from: Findjewelers.cz  Minimally Clinically Important Difference (MCID) = 12.8%  COGNITION: Overall cognitive status: Within functional limits for tasks assessed     SENSATION: Occasional pain down the left outer thigh and into the knee but appears to be referred vs radicular  MUSCLE LENGTH: Initial eval: Hamstrings: Right 55 deg; Left 50 deg Thomas test: Right pos; Left pos  05/19/24: Hamstrings:  Right 70 deg; Left 70 deg Thomas test: Right pos; Left pos   POSTURE: Scoliosis obvious, right trunk shift, even more pronounced lying down  PALPATION: Tender left IT band  LUMBAR ROM:   AROM eval 04/28/24 07/07/24  Flexion WNL WNL WNL  Extension 50% 75% WNL with slight discomfort  Right lateral flexion WNL WNL WNL  Left lateral flexion Fingertips to just above joint line WFL WNL  Right rotation WNL WNL WNL  Left rotation WNL WNL WNL   (Blank rows = not tested)  LOWER EXTREMITY ROM:     WFL  LOWER EXTREMITY MMT:    Initial eval: Generally 4 to 4+/5 with exception of hip extension 3+ bilaterally, hip abduction 4- right hip, 3+ left hip  04/28/24: Improved to 4+/5 on all   LUMBAR SPECIAL TESTS:  Straight leg raise test: Negative, FABER test: Negative, and Thomas test: Positive  FUNCTIONAL TESTS:  Initial eval: 5 times sit to stand: 10.74 sec Timed up and go (TUG): 9.40 sec  04/28/24: 5 times sit to stand: 7.29 sec Timed up and go (TUG): 7.05 sec   06/08/24: 5 times sit to stand: 6.83 sec Timed up and go (TUG): 6.83 sec  07/07/24: 5 times sit to stand: 7.87 sec Timed up and go (TUG): 6.75 sec GAIT: Distance walked: 30 feet Assistive device utilized: None Level of assistance: Modified independence Comments: fairly normal heel to toe progression  TREATMENT DATE:  07/23/24 Nustep x 6 min level 5 Star drill (5 point) x 5 on each LE Step up and hold x 10 each LE fwd then x 10 on each LE lateral   3 point leg swing on balance pad x 5 each LE March on balance pad x 20  Squats on balance pad 2 x 10 Around the worlds on balance pad x 10 each direction with 5 lb kb Alternating toe tap ups on 8 step x 20 6 step up 2 x 10 each LE Left foot on step : right lateral trunk stretch x 5 holding 10 sec each Left foot on step: right UE bottoms up press with 5 lb kb 2 x 10 Right foot on step: left UE bottoms up press with 5 lb kb 2 x 10 Resisted walking: fwd, back, lateral stepping each side x 5 each direction (5 steps) Standing in tandem on balance pad : locomotives with 2 lb dumbbells   07/16/24 Nustep x 6 min level 5 Star drill (5 point) x 5 on each LE 3 point leg swing on balance pad x 5 each LE March on balance pad x 20  Squats on balance pad 2 x 10 Around the worlds on balance pad x 10 each direction with 10 lb kb Alternating toe tap ups on mat table x 20 6 step up 2 x 10 each LE Left foot on step : right lateral trunk stretch x 5 holding 19 sec each Left foot on step: right UE bottoms up press with 5 lb kb 2 x 10 Right foot on step: left UE bottoms up press with 5 lb kb 2 x 10 Resisted walking: fwd, back, lateral stepping each side x 5 each direction (5 steps) Lunge to BOSU  x 10 each LE fwd, then x 10 each LE lateral Standing in tandem on balance pad : locomotives with 2 lb dumbbells   07/07/24 Nustep x 6 min level 5 20th visit and recert assessment completed Supine heel grabs 2 x 20 Supine PPT 2 x 10 PPT with  90/90 heel taps Supine PPT with dying bug unsupported x 20 Supine PPT with hand to opposite knee 2 x 10 each side with 4lbs (unsupported) Tail wags 2 x 20 (verbal cues to reach when going to left) Around the worlds x 10 each way with 10 lb kb standing on balance pad Squats with 10 lb kb on balance pad  Standing in tandem on balance pad : locomotives with 2 lb dumbbells 2 sets Step up and hold fwd and lateral x 10 each LE in each direction on balance pad  Alternating  toe tap ups on mat table x 20 Lunge to BOSU  x 10 each LE fwd, then x 10 each LE lateral   PATIENT EDUCATION:  Education details: initiated HEP, Educated on proper sleeping posture and lumbar support, Educated on appropriate exercises and those to avoid to reduce stress to the lumbar spine Person educated: Patient Education method: Programmer, Multimedia, Facilities Manager, Verbal cues, and Handouts Education comprehension: verbalized understanding, returned demonstration, verbal cues required, and tactile cues required  HOME EXERCISE PROGRAM: Access Code: [email or web address] URL: [email or web address] Date: 06/18/2024 Prepared by: Delon Haddock  Exercises - Standing Hamstring Stretch on Chair  - 1 x daily - 7 x weekly - 1 sets - 3 reps - 30 sec hold - Quadricep Stretch with Chair and Counter Support  - 1 x daily - 7 x weekly - 1 sets - 3 reps - 30 sec hold - Single Leg Balance Raising Opposite Arm and Leg  - 1 x daily - 7 x weekly - 3 sets - 10 reps - Seated Figure 4 Piriformis Stretch  - 1 x daily - 7 x weekly - 1 sets - 3 reps - 30 sed hold - Seated Lateral Trunk Stretch on Swiss Ball  - 1 x daily - 7 x weekly - 1 sets - 10 reps - 5 sec hold - Supine 90/90 Alternating Heel Touches with Posterior Pelvic Tilt  - 1 x daily - 7 x weekly - 3 sets - 10 reps - Supine Dead Bug with Leg Extension  - 1 x daily - 7 x weekly - 3 sets - 10 reps - Clamshells with Resistance (BKA)  - 1 x daily - 7 x weekly - 3 sets - 10 reps - Hooklying Isometric Clamshell  - 1 x daily - 7 x weekly - 3 sets - 10 reps - Standing Balance Activity: Plyometric Squat to Jump  - 1 x daily - 7 x weekly - 1 sets - 10 reps - Squat Jumps  - 1 x daily - 7 x weekly - 3 sets - 10 reps - Sit to Stand Without Arm Support  - 1 x daily - 7 x weekly - 1 sets - 10 reps - Squat with Chair Touch  - 1 x daily - 7 x weekly - 1 sets - 10 reps ASSESSMENT:  CLINICAL IMPRESSION: Melesa's Parkinson's symptoms have been more noticeable in the past  few visits.  MD did change her medication to adjust for this.  She admits she has been doing a lot of different workout classes.  PT spent a few minutes discussing how being overly fatigued will also make her symptoms more pronounced.   She tends to be involved in numerous classes throughout the week in addition to her PT sessions.  She remains well motivated and compliant and should continue to do well.  She would benefit from completing her final few visits to meet final goals below.   OBJECTIVE IMPAIRMENTS: difficulty  walking, decreased ROM, decreased strength, increased fascial restrictions, increased muscle spasms, impaired flexibility, improper body mechanics, postural dysfunction, and pain.   ACTIVITY LIMITATIONS: carrying, lifting, bending, standing, squatting, sleeping, stairs, transfers, bed mobility, bathing, toileting, dressing, and caring for others  PARTICIPATION LIMITATIONS: meal prep, cleaning, laundry, driving, shopping, community activity, and yard work  PERSONAL FACTORS: Fitness and 1-2 comorbidities: Parkinsons and Gilberts syndrome are also affecting patient's functional outcome.   REHAB POTENTIAL: Good  CLINICAL DECISION MAKING: Evolving/moderate complexity  EVALUATION COMPLEXITY: Moderate   GOALS: Goals reviewed with patient? Yes  SHORT TERM GOALS: Target date: 04/20/2024  Pain report to be no greater than 4/10  Baseline: Goal status: MET 04/15/24  2.  Patient will be independent with initial HEP  Baseline:  Goal status: MET 04/17/24  3.  Patient to report 30% or better improvement in overall function Baseline:  Goal status: MET 04/29/24   LONG TERM GOALS: Target date: 08/18/2024  Patient to report pain no greater than 2/10  Baseline:  Goal status: MET 04/29/24  2.  Patient to be independent with advanced HEP  Baseline:  Goal status: MET 04/29/24  3.  Patients Oswestry score to improve by 3-5 points Baseline:  Goal status: In Progress  4.   Functional scores to improve by 2-3 seconds Baseline:  Goal status: MET 04/28/24  5.  Patient to be able to sleep through the night  Baseline:  Goal status: MET 05/05/24  6.  Patient to report 85% improvement in overall symptoms Baseline:  Goal status: In Progress  PLAN:  PT FREQUENCY: 1-2x/week  PT DURATION: 8 weeks  PLANNED INTERVENTIONS: 97110-Therapeutic exercises, 97530- Therapeutic activity, [id number]- Neuromuscular re-education, 97535- Self Care, [id number]- Manual therapy, [phone]- Gait training, [phone]- Canalith repositioning, [id number]- Aquatic Therapy, [phone]- Electrical stimulation (unattended), [id number]- Electrical stimulation (manual), [id number]- Ultrasound, [id number]- Traction (mechanical), [id number]- Ionotophoresis 4mg /ml Dexamethasone , Patient/Family education, Balance training, Stair training, Taping, Joint mobilization, Spinal mobilization, Vestibular training, DME instructions, Cryotherapy, and Moist heat.  PLAN FOR NEXT SESSION: Nustep, Dynamic balance, resistance training, balance training  Rangel Echeverri B. Twanisha Foulk, PT 07/23/24 3:35 PM [facility] [address], [address] [place], [place] [address] Phone # [phone] Fax [phone]  "

## 2024-07-28 ENCOUNTER — Ambulatory Visit: Admitting: Physical Therapy

## 2024-07-30 ENCOUNTER — Ambulatory Visit

## 2024-07-30 DIAGNOSIS — R262 Difficulty in walking, not elsewhere classified: Secondary | ICD-10-CM

## 2024-07-30 DIAGNOSIS — M5459 Other low back pain: Secondary | ICD-10-CM | POA: Diagnosis not present

## 2024-07-30 DIAGNOSIS — R293 Abnormal posture: Secondary | ICD-10-CM

## 2024-07-30 DIAGNOSIS — R252 Cramp and spasm: Secondary | ICD-10-CM

## 2024-07-30 DIAGNOSIS — M6281 Muscle weakness (generalized): Secondary | ICD-10-CM

## 2024-07-30 NOTE — Therapy (Signed)
 " OUTPATIENT PHYSICAL THERAPY THORACOLUMBAR TREATMENT  Patient Name: Dominique Griffin MRN: [id number] DOB:24-Feb-1951, 74 y.o., female Today's Date: 07/30/2024  END OF SESSION:  PT End of Session - 07/30/24 1452     Visit Number 22    Number of Visits 24    Date for Recertification  08/18/24    Authorization Type [employer] Approved 16 visits-03/23/24-06/21/2024-auth#[id number]// 8 more approved 06/25/24 through 08/18/24    Authorization Time Period 12/18//25- 08/18/24    Authorization - Visit Number 22    Authorization - Number of Visits 24    Progress Note Due on Visit 30    PT Start Time 1445    PT Stop Time 1530    PT Time Calculation (min) 45 min    Activity Tolerance Patient tolerated treatment well    Behavior During Therapy WFL for tasks assessed/performed             Past Medical History:  Diagnosis Date   Gilbert's syndrome    History of ITP    Osteopenia    Parkinson disease (HCC)    Past Surgical History:  Procedure Laterality Date   ABDOMINAL HYSTERECTOMY  1993   fibroids, ovaries remain   BREAST EXCISIONAL BIOPSY Left    skin bbiopsy   TONSILLECTOMY     Patient Active Problem List   Diagnosis Date Noted   Lumbar back pain with radiculopathy affecting left lower extremity 03/10/2024   Class 1 obesity due to excess calories without serious comorbidity with body mass index (BMI) of 30.0 to 30.9 in adult 12/15/2023   Gallstones 02/25/2023   B12 deficiency 01/07/2023   Dizziness - PPPD 10/24/2022   Chronic ITP (idiopathic thrombocytopenia) (HCC) 09/07/2022   Clinical trial exam 06/27/2021   Osteoporosis 11/13/2020   Vitamin D deficiency 11/13/2020   Diverticulosis of colon 11/13/2020   Family history of colon cancer 11/13/2020   Arthritis 09/27/2020   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   Coccyx pain 09/27/2020   Parkinson's disease - Dr Evonnie 12/02/2019    PCP: Geofm Glade PARAS, MD  REFERRING PROVIDER: Geofm Glade PARAS, MD  REFERRING DIAG: M54.9 (ICD-10-CM) - Back  pain, unspecified back location, unspecified back pain laterality, unspecified chronicity  Rationale for Evaluation and Treatment: Rehabilitation  THERAPY DIAG:  Other low back pain  Difficulty in walking, not elsewhere classified  Abnormal posture  Cramp and spasm  Muscle weakness (generalized)  ONSET DATE: 03/19/2024  SUBJECTIVE:  SUBJECTIVE STATEMENT: Patient reports feeling much better today than last visit  From Eval: Back pain since she was in her 40's. In her 50's fell from a ladder.  In her 2's, a lot of OA found.  In her 59's, diagnosed with Parkinsons.  Now very active doing [employer], multiple Parkinsons classes, Tai Chi, multiple stretching classes to try and deal with the parkinsons symptoms.  She was doing happy baby and felt pain in the low back flare up.  She was prescribed dose pack and other meds.  She hopes to eliminate the back pain so that she can continue working on controlling her Parkinson's symptoms.    PERTINENT HISTORY:  Parkinson's    PAIN:  07/30/24 Are you having pain? Yes: NPRS scale: 0/10 Pain location: Left lower back, left lateral LE, occasional left lateral knee Pain description: aching Aggravating factors: standing Relieving factors: exercise, meds  PRECAUTIONS: None  RED FLAGS: None   WEIGHT BEARING RESTRICTIONS: No  FALLS:  Has patient fallen in last 6 months? No  LIVING ENVIRONMENT: Lives with: lives with their family Lives in: House/apartment Stairs: Yes: Internal: 17 steps; on right going up and External: 5 steps; on right going up Has following equipment at home: None  OCCUPATION: retired   PLOF: Independent, Independent with basic ADLs, Independent with household mobility without device, Independent with community mobility without  device, Independent with homemaking with ambulation, Independent with gait, and Independent with transfers  PATIENT GOALS: To be able to continue to exercise to reduce her Parkinson's symptoms  NEXT MD VISIT: prn  OBJECTIVE:  Note: Objective measures were completed at Evaluation unless otherwise noted.  DIAGNOSTIC FINDINGS:  na  PATIENT SURVEYS:  Modified Oswestry:  MODIFIED OSWESTRY DISABILITY SCALE  Date: 03/23/24 Score 14/50     Date: 05/19/24 Score 6/50  Date: 07/07/24 Score: 3/50  Total    Interpretation of scores: Score Category Description  0-20% Minimal Disability The patient can cope with most living activities. Usually no treatment is indicated apart from advice on lifting, sitting and exercise  21-40% Moderate Disability The patient experiences more pain and difficulty with sitting, lifting and standing. Travel and social life are more difficult and they may be disabled from work. Personal care, sexual activity and sleeping are not grossly affected, and the patient can usually be managed by conservative means  41-60% Severe Disability Pain remains the main problem in this group, but activities of daily living are affected. These patients require a detailed investigation  61-80% Crippled Back pain impinges on all aspects of the patients life. Positive intervention is required  81-100% Bed-bound  These patients are either bed-bound or exaggerating their symptoms  [name], et al. Surgery versus conservative management of stable thoracolumbar fracture: the PRESTO feasibility RCT. [place] ([place]): [employer]; 2021 Nov. [facility] Technology Assessment, No. 25.62.) Appendix 3, Oswestry Disability Index category descriptors. Available from: Findjewelers.cz  Minimally Clinically Important Difference (MCID) = 12.8%  COGNITION: Overall cognitive status: Within functional limits for tasks  assessed     SENSATION: Occasional pain down the left outer thigh and into the knee but appears to be referred vs radicular  MUSCLE LENGTH: Initial eval: Hamstrings: Right 55 deg; Left 50 deg Thomas test: Right pos; Left pos  05/19/24: Hamstrings: Right 70 deg; Left 70 deg Thomas test: Right pos; Left pos   POSTURE: Scoliosis obvious, right trunk shift, even more pronounced lying down  PALPATION: Tender left IT band  LUMBAR ROM:  AROM eval 04/28/24 07/07/24  Flexion WNL WNL WNL  Extension 50% 75% WNL with slight discomfort  Right lateral flexion WNL WNL WNL  Left lateral flexion Fingertips to just above joint line WFL WNL  Right rotation WNL WNL WNL  Left rotation WNL WNL WNL   (Blank rows = not tested)  LOWER EXTREMITY ROM:     WFL  LOWER EXTREMITY MMT:    Initial eval: Generally 4 to 4+/5 with exception of hip extension 3+ bilaterally, hip abduction 4- right hip, 3+ left hip  04/28/24: Improved to 4+/5 on all   LUMBAR SPECIAL TESTS:  Straight leg raise test: Negative, FABER test: Negative, and Thomas test: Positive  FUNCTIONAL TESTS:  Initial eval: 5 times sit to stand: 10.74 sec Timed up and go (TUG): 9.40 sec  04/28/24: 5 times sit to stand: 7.29 sec Timed up and go (TUG): 7.05 sec   06/08/24: 5 times sit to stand: 6.83 sec Timed up and go (TUG): 6.83 sec  07/07/24: 5 times sit to stand: 7.87 sec Timed up and go (TUG): 6.75 sec GAIT: Distance walked: 30 feet Assistive device utilized: None Level of assistance: Modified independence Comments: fairly normal heel to toe progression  TREATMENT DATE:  07/30/24 Nustep x 6 min level 5 Step up and hold x 10 each LE fwd then x 10 on each LE lateral Half circle with hip push with slider x 5 on each LE (did in front of mirror to symmetry)  3 point leg swing on balance pad x 5 each LE with mirror for feedback March on balance pad x 20 with mirror Squats on balance pad 2 x 10 with mirror Around the  worlds on balance pad x 10 each direction with 5 lb kb Alternating toe tap ups on 8 step x 20 8 step up 2 x 10 each LE Left foot on step: right UE bottoms up press with 5 lb kb 2 x 10 Right foot on step: left UE bottoms up press with 5 lb kb 2 x 10 Left foot on step : right lateral trunk stretch x 5 holding 10 sec each Resisted walking: fwd, back, lateral stepping each side x 5 each direction (5 steps) Standing in tandem on balance pad : locomotives with 2 lb dumbbells x 20 with each foot in front   07/23/24 Nustep x 6 min level 5 Star drill (5 point) x 5 on each LE Step up and hold x 10 each LE fwd then x 10 on each LE lateral  3 point leg swing on balance pad x 5 each LE March on balance pad x 20  Squats on balance pad 2 x 10 Around the worlds on balance pad x 10 each direction with 5 lb kb Alternating toe tap ups on 8 step x 20 6 step up 2 x 10 each LE Left foot on step : right lateral trunk stretch x 5 holding 10 sec each Left foot on step: right UE bottoms up press with 5 lb kb 2 x 10 Right foot on step: left UE bottoms up press with 5 lb kb 2 x 10 Resisted walking: fwd, back, lateral stepping each side x 5 each direction (5 steps) Standing in tandem on balance pad : locomotives with 2 lb dumbbells   07/16/24 Nustep x 6 min level 5 Star drill (5 point) x 5 on each LE 3 point leg swing on balance pad x 5 each LE March on balance pad x 20  Squats on  balance pad 2 x 10 Around the worlds on balance pad x 10 each direction with 10 lb kb Alternating toe tap ups on mat table x 20 6 step up 2 x 10 each LE Left foot on step : right lateral trunk stretch x 5 holding 19 sec each Left foot on step: right UE bottoms up press with 5 lb kb 2 x 10 Right foot on step: left UE bottoms up press with 5 lb kb 2 x 10 Resisted walking: fwd, back, lateral stepping each side x 5 each direction (5 steps) Lunge to BOSU  x 10 each LE fwd, then x 10 each LE lateral Standing in tandem on balance  pad : locomotives with 2 lb dumbbells   PATIENT EDUCATION:  Education details: initiated HEP, Educated on proper sleeping posture and lumbar support, Educated on appropriate exercises and those to avoid to reduce stress to the lumbar spine Person educated: Patient Education method: Programmer, Multimedia, Facilities Manager, Verbal cues, and Handouts Education comprehension: verbalized understanding, returned demonstration, verbal cues required, and tactile cues required  HOME EXERCISE PROGRAM: Access Code: [email or web address] URL: [email or web address] Date: 06/18/2024 Prepared by: Delon Haddock  Exercises - Standing Hamstring Stretch on Chair  - 1 x daily - 7 x weekly - 1 sets - 3 reps - 30 sec hold - Quadricep Stretch with Chair and Counter Support  - 1 x daily - 7 x weekly - 1 sets - 3 reps - 30 sec hold - Single Leg Balance Raising Opposite Arm and Leg  - 1 x daily - 7 x weekly - 3 sets - 10 reps - Seated Figure 4 Piriformis Stretch  - 1 x daily - 7 x weekly - 1 sets - 3 reps - 30 sed hold - Seated Lateral Trunk Stretch on Swiss Ball  - 1 x daily - 7 x weekly - 1 sets - 10 reps - 5 sec hold - Supine 90/90 Alternating Heel Touches with Posterior Pelvic Tilt  - 1 x daily - 7 x weekly - 3 sets - 10 reps - Supine Dead Bug with Leg Extension  - 1 x daily - 7 x weekly - 3 sets - 10 reps - Clamshells with Resistance (BKA)  - 1 x daily - 7 x weekly - 3 sets - 10 reps - Hooklying Isometric Clamshell  - 1 x daily - 7 x weekly - 3 sets - 10 reps - Standing Balance Activity: Plyometric Squat to Jump  - 1 x daily - 7 x weekly - 1 sets - 10 reps - Squat Jumps  - 1 x daily - 7 x weekly - 3 sets - 10 reps - Sit to Stand Without Arm Support  - 1 x daily - 7 x weekly - 1 sets - 10 reps - Squat with Chair Touch  - 1 x daily - 7 x weekly - 1 sets - 10 reps ASSESSMENT:  CLINICAL IMPRESSION: Rheannon was able to do a lot more today.  Her Parkinson's symptoms are less pronounced today.  She is progressing toward  final goals.  She would benefit from completing her final few visits to work on balance, posture and stability.    OBJECTIVE IMPAIRMENTS: difficulty walking, decreased ROM, decreased strength, increased fascial restrictions, increased muscle spasms, impaired flexibility, improper body mechanics, postural dysfunction, and pain.   ACTIVITY LIMITATIONS: carrying, lifting, bending, standing, squatting, sleeping, stairs, transfers, bed mobility, bathing, toileting, dressing, and caring for others  PARTICIPATION LIMITATIONS: meal prep, cleaning, laundry, driving, shopping, community activity, and  yard work  PERSONAL FACTORS: Fitness and 1-2 comorbidities: Parkinsons and Gilberts syndrome are also affecting patient's functional outcome.   REHAB POTENTIAL: Good  CLINICAL DECISION MAKING: Evolving/moderate complexity  EVALUATION COMPLEXITY: Moderate   GOALS: Goals reviewed with patient? Yes  SHORT TERM GOALS: Target date: 04/20/2024  Pain report to be no greater than 4/10  Baseline: Goal status: MET 04/15/24  2.  Patient will be independent with initial HEP  Baseline:  Goal status: MET 04/17/24  3.  Patient to report 30% or better improvement in overall function Baseline:  Goal status: MET 04/29/24   LONG TERM GOALS: Target date: 08/18/2024  Patient to report pain no greater than 2/10  Baseline:  Goal status: MET 04/29/24  2.  Patient to be independent with advanced HEP  Baseline:  Goal status: MET 04/29/24  3.  Patients Oswestry score to improve by 3-5 points Baseline:  Goal status: In Progress  4.  Functional scores to improve by 2-3 seconds Baseline:  Goal status: MET 04/28/24  5.  Patient to be able to sleep through the night  Baseline:  Goal status: MET 05/05/24  6.  Patient to report 85% improvement in overall symptoms Baseline:  Goal status: In Progress  PLAN:  PT FREQUENCY: 1-2x/week  PT DURATION: 8 weeks  PLANNED INTERVENTIONS: 97110-Therapeutic  exercises, 97530- Therapeutic activity, [id number]- Neuromuscular re-education, 97535- Self Care, [id number]- Manual therapy, [phone]- Gait training, [phone]- Canalith repositioning, [id number]- Aquatic Therapy, [phone]- Electrical stimulation (unattended), [phone]- Electrical stimulation (manual), [id number]- Ultrasound, [id number]- Traction (mechanical), [id number]- Ionotophoresis 4mg /ml Dexamethasone , Patient/Family education, Balance training, Stair training, Taping, Joint mobilization, Spinal mobilization, Vestibular training, DME instructions, Cryotherapy, and Moist heat.  PLAN FOR NEXT SESSION: Nustep, Dynamic balance, resistance training, balance training  Delshon Blanchfield B. Teryn Gust, PT 07/30/24 4:19 PM [facility] [address] [place], [place] [address] Phone # [phone] Fax [phone]  "

## 2024-08-06 ENCOUNTER — Ambulatory Visit

## 2024-08-06 DIAGNOSIS — R262 Difficulty in walking, not elsewhere classified: Secondary | ICD-10-CM

## 2024-08-06 DIAGNOSIS — M5459 Other low back pain: Secondary | ICD-10-CM | POA: Diagnosis not present

## 2024-08-06 DIAGNOSIS — M6281 Muscle weakness (generalized): Secondary | ICD-10-CM

## 2024-08-06 DIAGNOSIS — R252 Cramp and spasm: Secondary | ICD-10-CM

## 2024-08-06 DIAGNOSIS — R293 Abnormal posture: Secondary | ICD-10-CM

## 2024-08-06 NOTE — Therapy (Signed)
 " OUTPATIENT PHYSICAL THERAPY THORACOLUMBAR TREATMENT  Patient Name: Dominique Griffin MRN: [id number] DOB:Oct 17, 1950, 74 y.o., female Today's Date: 08/06/2024  END OF SESSION:  PT End of Session - 08/06/24 1101     Visit Number 23    Number of Visits 24    Date for Recertification  08/18/24    Authorization Type [employer] Approved 16 visits-03/23/24-06/21/2024-auth#[id number]// 8 more approved 06/25/24 through 08/18/24    Authorization Time Period 12/18//25- 08/18/24    Authorization - Visit Number 23    Authorization - Number of Visits 24    Progress Note Due on Visit 30    PT Start Time 1102    PT Stop Time 1148    PT Time Calculation (min) 46 min    Activity Tolerance Patient tolerated treatment well    Behavior During Therapy WFL for tasks assessed/performed             Past Medical History:  Diagnosis Date   Gilbert's syndrome    History of ITP    Osteopenia    Parkinson disease (HCC)    Past Surgical History:  Procedure Laterality Date   ABDOMINAL HYSTERECTOMY  1993   fibroids, ovaries remain   BREAST EXCISIONAL BIOPSY Left    skin bbiopsy   TONSILLECTOMY     Patient Active Problem List   Diagnosis Date Noted   Lumbar back pain with radiculopathy affecting left lower extremity 03/10/2024   Class 1 obesity due to excess calories without serious comorbidity with body mass index (BMI) of 30.0 to 30.9 in adult 12/15/2023   Gallstones 02/25/2023   B12 deficiency 01/07/2023   Dizziness - PPPD 10/24/2022   Chronic ITP (idiopathic thrombocytopenia) (HCC) 09/07/2022   Clinical trial exam 06/27/2021   Osteoporosis 11/13/2020   Vitamin D deficiency 11/13/2020   Diverticulosis of colon 11/13/2020   Family history of colon cancer 11/13/2020   Arthritis 09/27/2020   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   Coccyx pain 09/27/2020   Parkinson's disease - Dr Evonnie 12/02/2019    PCP: Geofm Glade PARAS, MD  REFERRING PROVIDER: Geofm Glade PARAS, MD  REFERRING DIAG: M54.9 (ICD-10-CM) - Back  pain, unspecified back location, unspecified back pain laterality, unspecified chronicity  Rationale for Evaluation and Treatment: Rehabilitation  THERAPY DIAG:  Other low back pain  Difficulty in walking, not elsewhere classified  Abnormal posture  Cramp and spasm  Muscle weakness (generalized)  ONSET DATE: 03/19/2024  SUBJECTIVE:  SUBJECTIVE STATEMENT: Patient reports she is getting various workouts and classes in despite the weather.   Some mornings I am pretty stiff when I get up but I know that is part of my age.  Patient does at least one type of workout daily: [employer] either fitness or strength, power moves, stretching, walking the dog, and therapy when scheduled.    From Eval: Back pain since she was in her 40's. In her 50's fell from a ladder.  In her 80's, a lot of OA found.  In her 66's, diagnosed with Parkinsons.  Now very active doing [employer], multiple Parkinsons classes, Tai Chi, multiple stretching classes to try and deal with the parkinsons symptoms.  She was doing happy baby and felt pain in the low back flare up.  She was prescribed dose pack and other meds.  She hopes to eliminate the back pain so that she can continue working on controlling her Parkinson's symptoms.    PERTINENT HISTORY:  Parkinson's    PAIN:  08/06/24 Are you having pain? Yes: NPRS scale: 0/10 Pain location: Left lower back, left lateral LE, occasional left lateral knee Pain description: aching Aggravating factors: standing Relieving factors: exercise, meds  PRECAUTIONS: None  RED FLAGS: None   WEIGHT BEARING RESTRICTIONS: No  FALLS:  Has patient fallen in last 6 months? No  LIVING ENVIRONMENT: Lives with: lives with their family Lives in: House/apartment Stairs: Yes: Internal: 17  steps; on right going up and External: 5 steps; on right going up Has following equipment at home: None  OCCUPATION: retired   PLOF: Independent, Independent with basic ADLs, Independent with household mobility without device, Independent with community mobility without device, Independent with homemaking with ambulation, Independent with gait, and Independent with transfers  PATIENT GOALS: To be able to continue to exercise to reduce her Parkinson's symptoms  NEXT MD VISIT: prn  OBJECTIVE:  Note: Objective measures were completed at Evaluation unless otherwise noted.  DIAGNOSTIC FINDINGS:  na  PATIENT SURVEYS:  Modified Oswestry:  MODIFIED OSWESTRY DISABILITY SCALE  Date: 03/23/24 Score 14/50     Date: 05/19/24 Score 6/50  Date: 07/07/24 Score: 3/50  Total    Interpretation of scores: Score Category Description  0-20% Minimal Disability The patient can cope with most living activities. Usually no treatment is indicated apart from advice on lifting, sitting and exercise  21-40% Moderate Disability The patient experiences more pain and difficulty with sitting, lifting and standing. Travel and social life are more difficult and they may be disabled from work. Personal care, sexual activity and sleeping are not grossly affected, and the patient can usually be managed by conservative means  41-60% Severe Disability Pain remains the main problem in this group, but activities of daily living are affected. These patients require a detailed investigation  61-80% Crippled Back pain impinges on all aspects of the patients life. Positive intervention is required  81-100% Bed-bound  These patients are either bed-bound or exaggerating their symptoms  [name], et al. Surgery versus conservative management of stable thoracolumbar fracture: the PRESTO feasibility RCT. [place] ([place]): [employer]; 2021 Nov. [facility] Technology Assessment, No. 25.62.) Appendix 3,  Oswestry Disability Index category descriptors. Available from: [email or web address].cz  Minimally Clinically Important Difference (MCID) = 12.8%  COGNITION: Overall cognitive status: Within functional limits for tasks assessed     SENSATION: Occasional pain down the left outer thigh and into the knee but appears to be referred vs radicular  MUSCLE LENGTH:  Initial eval: Hamstrings: Right 55 deg; Left 50 deg Thomas test: Right pos; Left pos  05/19/24: Hamstrings: Right 70 deg; Left 70 deg Thomas test: Right pos; Left pos   POSTURE: Scoliosis obvious, right trunk shift, even more pronounced lying down  PALPATION: Tender left IT band  LUMBAR ROM:   AROM eval 04/28/24 07/07/24  Flexion WNL WNL WNL  Extension 50% 75% WNL with slight discomfort  Right lateral flexion WNL WNL WNL  Left lateral flexion Fingertips to just above joint line WFL WNL  Right rotation WNL WNL WNL  Left rotation WNL WNL WNL   (Blank rows = not tested)  LOWER EXTREMITY ROM:     WFL  LOWER EXTREMITY MMT:    Initial eval: Generally 4 to 4+/5 with exception of hip extension 3+ bilaterally, hip abduction 4- right hip, 3+ left hip  04/28/24: Improved to 4+/5 on all   LUMBAR SPECIAL TESTS:  Straight leg raise test: Negative, FABER test: Negative, and Thomas test: Positive  FUNCTIONAL TESTS:  Initial eval: 5 times sit to stand: 10.74 sec Timed up and go (TUG): 9.40 sec  04/28/24: 5 times sit to stand: 7.29 sec Timed up and go (TUG): 7.05 sec   06/08/24: 5 times sit to stand: 6.83 sec Timed up and go (TUG): 6.83 sec  07/07/24: 5 times sit to stand: 7.87 sec Timed up and go (TUG): 6.75 sec GAIT: Distance walked: 30 feet Assistive device utilized: None Level of assistance: Modified independence Comments: fairly normal heel to toe progression  TREATMENT DATE:  08/06/24 Nustep x 6 min level 5 Lunges to BOSU fwd and lateral x 10 each LE in each direction Reverse  lunge with pull through to high knee 2 x 10 each LE Fwd step over and back over Teclor bar x 10 ea LE (floor) Lateral step over and back over Teclor bar x 10 Half circle with hip push with slider x 10 fwd and then reverse on each LE (did in front of mirror to symmetry)  Alternating toe tap ups on 8 step x 20 8 step up 2 x 10 each LE in front of mirror for visual feed back for symmetry Around the worlds on balance pad x 10 each direction with 5 lb kb Resisted walking: fwd, back, lateral stepping each side x 5 each direction (5 steps) with 10 lbs Standing in tandem on balance pad : locomotives with 2 lb dumbbells x 20 with each foot in front  07/30/24 Nustep x 6 min level 5 Step up and hold x 10 each LE fwd then x 10 on each LE lateral Half circle with hip push with slider x 5 on each LE (did in front of mirror to symmetry)  3 point leg swing on balance pad x 5 each LE with mirror for feedback March on balance pad x 20 with mirror Squats on balance pad 2 x 10 with mirror Around the worlds on balance pad x 10 each direction with 5 lb kb Alternating toe tap ups on 8 step x 20 8 step up 2 x 10 each LE Left foot on step: right UE bottoms up press with 5 lb kb 2 x 10 Right foot on step: left UE bottoms up press with 5 lb kb 2 x 10 Left foot on step : right lateral trunk stretch x 5 holding 10 sec each Resisted walking: fwd, back, lateral stepping each side x 5 each direction (5 steps) Standing in tandem on balance pad : locomotives with  2 lb dumbbells x 20 with each foot in front   07/23/24 Nustep x 6 min level 5 Star drill (5 point) x 5 on each LE Step up and hold x 10 each LE fwd then x 10 on each LE lateral  3 point leg swing on balance pad x 5 each LE March on balance pad x 20  Squats on balance pad 2 x 10 Around the worlds on balance pad x 10 each direction with 5 lb kb Alternating toe tap ups on 8 step x 20 6 step up 2 x 10 each LE Left foot on step : right lateral trunk  stretch x 5 holding 10 sec each Left foot on step: right UE bottoms up press with 5 lb kb 2 x 10 Right foot on step: left UE bottoms up press with 5 lb kb 2 x 10 Resisted walking: fwd, back, lateral stepping each side x 5 each direction (5 steps) Standing in tandem on balance pad : locomotives with 2 lb dumbbells   07/16/24 Nustep x 6 min level 5 Star drill (5 point) x 5 on each LE 3 point leg swing on balance pad x 5 each LE March on balance pad x 20  Squats on balance pad 2 x 10 Around the worlds on balance pad x 10 each direction with 10 lb kb Alternating toe tap ups on mat table x 20 6 step up 2 x 10 each LE Left foot on step : right lateral trunk stretch x 5 holding 19 sec each Left foot on step: right UE bottoms up press with 5 lb kb 2 x 10 Right foot on step: left UE bottoms up press with 5 lb kb 2 x 10 Resisted walking: fwd, back, lateral stepping each side x 5 each direction (5 steps) Lunge to BOSU  x 10 each LE fwd, then x 10 each LE lateral Standing in tandem on balance pad : locomotives with 2 lb dumbbells   PATIENT EDUCATION:  Education details: initiated HEP, Educated on proper sleeping posture and lumbar support, Educated on appropriate exercises and those to avoid to reduce stress to the lumbar spine Person educated: Patient Education method: Programmer, Multimedia, Facilities Manager, Verbal cues, and Handouts Education comprehension: verbalized understanding, returned demonstration, verbal cues required, and tactile cues required  HOME EXERCISE PROGRAM: Access Code: [email or web address] URL: [email or web address] Date: 06/18/2024 Prepared by: Delon Haddock  Exercises - Standing Hamstring Stretch on Chair  - 1 x daily - 7 x weekly - 1 sets - 3 reps - 30 sec hold - Quadricep Stretch with Chair and Counter Support  - 1 x daily - 7 x weekly - 1 sets - 3 reps - 30 sec hold - Single Leg Balance Raising Opposite Arm and Leg  - 1 x daily - 7 x weekly - 3 sets - 10 reps - Seated  Figure 4 Piriformis Stretch  - 1 x daily - 7 x weekly - 1 sets - 3 reps - 30 sed hold - Seated Lateral Trunk Stretch on Swiss Ball  - 1 x daily - 7 x weekly - 1 sets - 10 reps - 5 sec hold - Supine 90/90 Alternating Heel Touches with Posterior Pelvic Tilt  - 1 x daily - 7 x weekly - 3 sets - 10 reps - Supine Dead Bug with Leg Extension  - 1 x daily - 7 x weekly - 3 sets - 10 reps - Clamshells with Resistance (BKA)  - 1 x daily - 7 x weekly -  3 sets - 10 reps - Hooklying Isometric Clamshell  - 1 x daily - 7 x weekly - 3 sets - 10 reps - Standing Balance Activity: Plyometric Squat to Jump  - 1 x daily - 7 x weekly - 1 sets - 10 reps - Squat Jumps  - 1 x daily - 7 x weekly - 3 sets - 10 reps - Sit to Stand Without Arm Support  - 1 x daily - 7 x weekly - 1 sets - 10 reps - Squat with Chair Touch  - 1 x daily - 7 x weekly - 1 sets - 10 reps ASSESSMENT:  CLINICAL IMPRESSION: Dominique Griffin is progressing appropriately.  She has one visit left.  She has several classes lined up for good DC plan.  She would benefit from her final visit for DC planning and re-assessment.      OBJECTIVE IMPAIRMENTS: difficulty walking, decreased ROM, decreased strength, increased fascial restrictions, increased muscle spasms, impaired flexibility, improper body mechanics, postural dysfunction, and pain.   ACTIVITY LIMITATIONS: carrying, lifting, bending, standing, squatting, sleeping, stairs, transfers, bed mobility, bathing, toileting, dressing, and caring for others  PARTICIPATION LIMITATIONS: meal prep, cleaning, laundry, driving, shopping, community activity, and yard work  PERSONAL FACTORS: Fitness and 1-2 comorbidities: Parkinsons and Gilberts syndrome are also affecting patient's functional outcome.   REHAB POTENTIAL: Good  CLINICAL DECISION MAKING: Evolving/moderate complexity  EVALUATION COMPLEXITY: Moderate   GOALS: Goals reviewed with patient? Yes  SHORT TERM GOALS: Target date: 04/20/2024  Pain report  to be no greater than 4/10  Baseline: Goal status: MET 04/15/24  2.  Patient will be independent with initial HEP  Baseline:  Goal status: MET 04/17/24  3.  Patient to report 30% or better improvement in overall function Baseline:  Goal status: MET 04/29/24   LONG TERM GOALS: Target date: 08/18/2024  Patient to report pain no greater than 2/10  Baseline:  Goal status: MET 04/29/24  2.  Patient to be independent with advanced HEP  Baseline:  Goal status: MET 04/29/24  3.  Patients Oswestry score to improve by 3-5 points Baseline:  Goal status: In Progress  4.  Functional scores to improve by 2-3 seconds Baseline:  Goal status: MET 04/28/24  5.  Patient to be able to sleep through the night  Baseline:  Goal status: MET 05/05/24  6.  Patient to report 85% improvement in overall symptoms Baseline:  Goal status: In Progress  PLAN:  PT FREQUENCY: 1-2x/week  PT DURATION: 8 weeks  PLANNED INTERVENTIONS: 97110-Therapeutic exercises, 97530- Therapeutic activity, [id number]- Neuromuscular re-education, 97535- Self Care, [id number]- Manual therapy, [phone]- Gait training, [phone]- Canalith repositioning, [id number]- Aquatic Therapy, [phone]- Electrical stimulation (unattended), [id number]- Electrical stimulation (manual), [id number]- Ultrasound, [id number]- Traction (mechanical), [id number]- Ionotophoresis 4mg /ml Dexamethasone , Patient/Family education, Balance training, Stair training, Taping, Joint mobilization, Spinal mobilization, Vestibular training, DME instructions, Cryotherapy, and Moist heat.  PLAN FOR NEXT SESSION: DC next visit,  Nustep, Dynamic balance, resistance training, balance training  Jeshawn Melucci B. Roslyn Else, PT 08/06/24 12:42 PM [facility] [address] [place], [place] [address] Phone # [phone] Fax [phone]  "

## 2024-08-12 ENCOUNTER — Ambulatory Visit

## 2024-08-13 ENCOUNTER — Ambulatory Visit

## 2024-08-14 ENCOUNTER — Ambulatory Visit

## 2024-08-14 DIAGNOSIS — M5459 Other low back pain: Secondary | ICD-10-CM

## 2024-08-14 DIAGNOSIS — R293 Abnormal posture: Secondary | ICD-10-CM

## 2024-08-14 DIAGNOSIS — R262 Difficulty in walking, not elsewhere classified: Secondary | ICD-10-CM

## 2024-08-14 DIAGNOSIS — M6281 Muscle weakness (generalized): Secondary | ICD-10-CM

## 2024-08-14 DIAGNOSIS — R252 Cramp and spasm: Secondary | ICD-10-CM

## 2024-08-14 NOTE — Therapy (Unsigned)
 " OUTPATIENT PHYSICAL THERAPY THORACOLUMBAR TREATMENT  Patient Name: Dominique Griffin MRN: [id number] DOB:1950/11/27, 74 y.o., female Today's Date: 08/14/2024  END OF SESSION:  PT End of Session - 08/14/24 1017     Visit Number 24    Date for Recertification  08/18/24    Authorization Type [employer] Approved 16 visits-03/23/24-06/21/2024-auth#[id number]// 8 more approved 06/25/24 through 08/18/24    Authorization Time Period 12/18//25- 08/18/24             Past Medical History:  Diagnosis Date   Gilbert's syndrome    History of ITP    Osteopenia    Parkinson disease (HCC)    Past Surgical History:  Procedure Laterality Date   ABDOMINAL HYSTERECTOMY  1993   fibroids, ovaries remain   BREAST EXCISIONAL BIOPSY Left    skin bbiopsy   TONSILLECTOMY     Patient Active Problem List   Diagnosis Date Noted   Lumbar back pain with radiculopathy affecting left lower extremity 03/10/2024   Class 1 obesity due to excess calories without serious comorbidity with body mass index (BMI) of 30.0 to 30.9 in adult 12/15/2023   Gallstones 02/25/2023   B12 deficiency 01/07/2023   Dizziness - PPPD 10/24/2022   Chronic ITP (idiopathic thrombocytopenia) (HCC) 09/07/2022   Clinical trial exam 06/27/2021   Osteoporosis 11/13/2020   Vitamin D deficiency 11/13/2020   Diverticulosis of colon 11/13/2020   Family history of colon cancer 11/13/2020   Arthritis 09/27/2020   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   Coccyx pain 09/27/2020   Parkinson's disease - Dr Evonnie 12/02/2019    PCP: Geofm Glade PARAS, MD  REFERRING PROVIDER: Geofm Glade PARAS, MD  REFERRING DIAG: M54.9 (ICD-10-CM) - Back pain, unspecified back location, unspecified back pain laterality, unspecified chronicity  Rationale for Evaluation and Treatment: Rehabilitation  THERAPY DIAG:  Other low back pain  Difficulty in walking, not elsewhere classified  Abnormal posture  Muscle weakness (generalized)  Cramp and spasm  ONSET DATE:  03/19/2024  SUBJECTIVE:                                                                                                                                                                                           SUBJECTIVE STATEMENT: Patient reports when I wake up, I am a little stiff and sore/ pain at 1/10 at most    From Eval: Back pain since she was in her 40's. In her 50's fell from a ladder.  In her 56's, a lot of OA found.  In her 18's, diagnosed with Parkinsons.  Now very active doing [employer], multiple Parkinsons classes, Tai Chi, multiple stretching classes to try and  deal with the parkinsons symptoms.  She was doing happy baby and felt pain in the low back flare up.  She was prescribed dose pack and other meds.  She hopes to eliminate the back pain so that she can continue working on controlling her Parkinson's symptoms.    PERTINENT HISTORY:  Parkinson's    PAIN:  2/6//26 Are you having pain? Yes: NPRS scale: 1/10 Pain location: Left lower back, left lateral LE, occasional left lateral knee Pain description: aching Aggravating factors: standing Relieving factors: exercise, meds  PRECAUTIONS: None  RED FLAGS: None   WEIGHT BEARING RESTRICTIONS: No  FALLS:  Has patient fallen in last 6 months? No  LIVING ENVIRONMENT: Lives with: lives with their family Lives in: House/apartment Stairs: Yes: Internal: 17 steps; on right going up and External: 5 steps; on right going up Has following equipment at home: None  OCCUPATION: retired   PLOF: Independent, Independent with basic ADLs, Independent with household mobility without device, Independent with community mobility without device, Independent with homemaking with ambulation, Independent with gait, and Independent with transfers  PATIENT GOALS: To be able to continue to exercise to reduce her Parkinson's symptoms  NEXT MD VISIT: prn  OBJECTIVE:  Note: Objective measures were completed at Evaluation unless otherwise  noted.  DIAGNOSTIC FINDINGS:  na  PATIENT SURVEYS:  Modified Oswestry:  MODIFIED OSWESTRY DISABILITY SCALE  Date: 03/23/24 Score 14/50     Date: 05/19/24 Score 6/50  Date: 07/07/24 Score: 3/50  Date: 08/14/24 Score: 0/50  Total    Interpretation of scores: Score Category Description  0-20% Minimal Disability The patient can cope with most living activities. Usually no treatment is indicated apart from advice on lifting, sitting and exercise  21-40% Moderate Disability The patient experiences more pain and difficulty with sitting, lifting and standing. Travel and social life are more difficult and they may be disabled from work. Personal care, sexual activity and sleeping are not grossly affected, and the patient can usually be managed by conservative means  41-60% Severe Disability Pain remains the main problem in this group, but activities of daily living are affected. These patients require a detailed investigation  61-80% Crippled Back pain impinges on all aspects of the patients life. Positive intervention is required  81-100% Bed-bound  These patients are either bed-bound or exaggerating their symptoms  [name], et al. Surgery versus conservative management of stable thoracolumbar fracture: the PRESTO feasibility RCT. [place] ([place]): [employer]; 2021 Nov. [facility] Technology Assessment, No. 25.62.) Appendix 3, Oswestry Disability Index category descriptors. Available from: [email or web address].cz  Minimally Clinically Important Difference (MCID) = 12.8%  COGNITION: Overall cognitive status: Within functional limits for tasks assessed     SENSATION: Occasional pain down the left outer thigh and into the knee but appears to be referred vs radicular  MUSCLE LENGTH: Initial eval: Hamstrings: Right 55 deg; Left 50 deg Thomas test: Right pos; Left pos  05/19/24: Hamstrings: Right 70 deg; Left 70 deg Thomas test: Right pos;  Left pos   POSTURE: Scoliosis obvious, right trunk shift, even more pronounced lying down  PALPATION: Tender left IT band  LUMBAR ROM:   AROM eval 04/28/24 07/07/24 08/14/24  Flexion WNL WNL WNL   Extension 50% 75% WNL with slight discomfort Just tightness  Right lateral flexion WNL WNL WNL   Left lateral flexion Fingertips to just above joint line WFL WNL   Right rotation WNL WNL WNL   Left rotation WNL WNL WNL    (  Blank rows = not tested)  LOWER EXTREMITY ROM:     WFL  LOWER EXTREMITY MMT:    Initial eval: Generally 4 to 4+/5 with exception of hip extension 3+ bilaterally, hip abduction 4- right hip, 3+ left hip  04/28/24: Improved to 4+/5 on all   LUMBAR SPECIAL TESTS:  Straight leg raise test: Negative, FABER test: Negative, and Thomas test: Positive  FUNCTIONAL TESTS:  Initial eval: 5 times sit to stand: 10.74 sec Timed up and go (TUG): 9.40 sec  04/28/24: 5 times sit to stand: 7.29 sec Timed up and go (TUG): 7.05 sec   06/08/24: 5 times sit to stand: 6.83 sec Timed up and go (TUG): 6.83 sec  07/07/24: 5 times sit to stand: 7.87 sec Timed up and go (TUG): 6.75 sec  08/14/24: 5 times sit to stand: 6.22 sec Timed up and go (TUG): 6.07 sec GAIT: Distance walked: 30 feet Assistive device utilized: None Level of assistance: Modified independence Comments: fairly normal heel to toe progression  TREATMENT DATE:  08/06/24 Nustep x 6 min level 5 Lunges to BOSU fwd and lateral x 10 each LE in each direction Reverse lunge with pull through to high knee 2 x 10 each LE Fwd step over and back over Teclor bar x 10 ea LE (floor) Lateral step over and back over Teclor bar x 10 Half circle with hip push with slider x 10 fwd and then reverse on each LE (did in front of mirror to symmetry)  Alternating toe tap ups on 8 step x 20 8 step up 2 x 10 each LE in front of mirror for visual feed back for symmetry Around the worlds on balance pad x 10 each direction with 5  lb kb Resisted walking: fwd, back, lateral stepping each side x 5 each direction (5 steps) with 10 lbs Standing in tandem on balance pad : locomotives with 2 lb dumbbells x 20 with each foot in front  07/30/24 Nustep x 6 min level 5 Step up and hold x 10 each LE fwd then x 10 on each LE lateral Half circle with hip push with slider x 5 on each LE (did in front of mirror to symmetry)  3 point leg swing on balance pad x 5 each LE with mirror for feedback March on balance pad x 20 with mirror Squats on balance pad 2 x 10 with mirror Around the worlds on balance pad x 10 each direction with 5 lb kb Alternating toe tap ups on 8 step x 20 8 step up 2 x 10 each LE Left foot on step: right UE bottoms up press with 5 lb kb 2 x 10 Right foot on step: left UE bottoms up press with 5 lb kb 2 x 10 Left foot on step : right lateral trunk stretch x 5 holding 10 sec each Resisted walking: fwd, back, lateral stepping each side x 5 each direction (5 steps) Standing in tandem on balance pad : locomotives with 2 lb dumbbells x 20 with each foot in front   07/23/24 Nustep x 6 min level 5 Star drill (5 point) x 5 on each LE Step up and hold x 10 each LE fwd then x 10 on each LE lateral  3 point leg swing on balance pad x 5 each LE March on balance pad x 20  Squats on balance pad 2 x 10 Around the worlds on balance pad x 10 each direction with 5 lb kb Alternating toe tap ups  on 8 step x 20 6 step up 2 x 10 each LE Left foot on step : right lateral trunk stretch x 5 holding 10 sec each Left foot on step: right UE bottoms up press with 5 lb kb 2 x 10 Right foot on step: left UE bottoms up press with 5 lb kb 2 x 10 Resisted walking: fwd, back, lateral stepping each side x 5 each direction (5 steps) Standing in tandem on balance pad : locomotives with 2 lb dumbbells   07/16/24 Nustep x 6 min level 5 Star drill (5 point) x 5 on each LE 3 point leg swing on balance pad x 5 each LE March on balance pad x  20  Squats on balance pad 2 x 10 Around the worlds on balance pad x 10 each direction with 10 lb kb Alternating toe tap ups on mat table x 20 6 step up 2 x 10 each LE Left foot on step : right lateral trunk stretch x 5 holding 19 sec each Left foot on step: right UE bottoms up press with 5 lb kb 2 x 10 Right foot on step: left UE bottoms up press with 5 lb kb 2 x 10 Resisted walking: fwd, back, lateral stepping each side x 5 each direction (5 steps) Lunge to BOSU  x 10 each LE fwd, then x 10 each LE lateral Standing in tandem on balance pad : locomotives with 2 lb dumbbells   PATIENT EDUCATION:  Education details: initiated HEP, Educated on proper sleeping posture and lumbar support, Educated on appropriate exercises and those to avoid to reduce stress to the lumbar spine Person educated: Patient Education method: Programmer, Multimedia, Facilities Manager, Verbal cues, and Handouts Education comprehension: verbalized understanding, returned demonstration, verbal cues required, and tactile cues required  HOME EXERCISE PROGRAM: Access Code: [email or web address] URL: [email or web address] Date: 06/18/2024 Prepared by: Delon Haddock  Exercises - Standing Hamstring Stretch on Chair  - 1 x daily - 7 x weekly - 1 sets - 3 reps - 30 sec hold - Quadricep Stretch with Chair and Counter Support  - 1 x daily - 7 x weekly - 1 sets - 3 reps - 30 sec hold - Single Leg Balance Raising Opposite Arm and Leg  - 1 x daily - 7 x weekly - 3 sets - 10 reps - Seated Figure 4 Piriformis Stretch  - 1 x daily - 7 x weekly - 1 sets - 3 reps - 30 sed hold - Seated Lateral Trunk Stretch on Swiss Ball  - 1 x daily - 7 x weekly - 1 sets - 10 reps - 5 sec hold - Supine 90/90 Alternating Heel Touches with Posterior Pelvic Tilt  - 1 x daily - 7 x weekly - 3 sets - 10 reps - Supine Dead Bug with Leg Extension  - 1 x daily - 7 x weekly - 3 sets - 10 reps - Clamshells with Resistance (BKA)  - 1 x daily - 7 x weekly - 3 sets - 10  reps - Hooklying Isometric Clamshell  - 1 x daily - 7 x weekly - 3 sets - 10 reps - Standing Balance Activity: Plyometric Squat to Jump  - 1 x daily - 7 x weekly - 1 sets - 10 reps - Squat Jumps  - 1 x daily - 7 x weekly - 3 sets - 10 reps - Sit to Stand Without Arm Support  - 1 x daily - 7 x weekly - 1 sets - 10 reps -  Squat with Chair Touch  - 1 x daily - 7 x weekly - 1 sets - 10 reps ASSESSMENT:  CLINICAL IMPRESSION: Dominique Griffin is progressing appropriately.  She has one visit left.  She has several classes lined up for good DC plan.  She would benefit from her final visit for DC planning and re-assessment.      OBJECTIVE IMPAIRMENTS: difficulty walking, decreased ROM, decreased strength, increased fascial restrictions, increased muscle spasms, impaired flexibility, improper body mechanics, postural dysfunction, and pain.   ACTIVITY LIMITATIONS: carrying, lifting, bending, standing, squatting, sleeping, stairs, transfers, bed mobility, bathing, toileting, dressing, and caring for others  PARTICIPATION LIMITATIONS: meal prep, cleaning, laundry, driving, shopping, community activity, and yard work  PERSONAL FACTORS: Fitness and 1-2 comorbidities: Parkinsons and Gilberts syndrome are also affecting patient's functional outcome.   REHAB POTENTIAL: Good  CLINICAL DECISION MAKING: Evolving/moderate complexity  EVALUATION COMPLEXITY: Moderate   GOALS: Goals reviewed with patient? Yes  SHORT TERM GOALS: Target date: 04/20/2024  Pain report to be no greater than 4/10  Baseline: Goal status: MET 04/15/24  2.  Patient will be independent with initial HEP  Baseline:  Goal status: MET 04/17/24  3.  Patient to report 30% or better improvement in overall function Baseline:  Goal status: MET 04/29/24   LONG TERM GOALS: Target date: 08/18/2024  Patient to report pain no greater than 2/10  Baseline:  Goal status: MET 04/29/24  2.  Patient to be independent with advanced HEP  Baseline:   Goal status: MET 04/29/24  3.  Patients Oswestry score to improve by 3-5 points Baseline:  Goal status: In Progress  4.  Functional scores to improve by 2-3 seconds Baseline:  Goal status: MET 04/28/24  5.  Patient to be able to sleep through the night  Baseline:  Goal status: MET 05/05/24  6.  Patient to report 85% improvement in overall symptoms Baseline:  Goal status: In Progress  PLAN:  PT FREQUENCY: 1-2x/week  PT DURATION: 8 weeks  PLANNED INTERVENTIONS: 97110-Therapeutic exercises, 97530- Therapeutic activity, [id number]- Neuromuscular re-education, 97535- Self Care, [id number]- Manual therapy, [phone]- Gait training, [phone]- Canalith repositioning, [id number]- Aquatic Therapy, [phone]- Electrical stimulation (unattended), [id number]- Electrical stimulation (manual), [id number]- Ultrasound, [id number]- Traction (mechanical), [id number]- Ionotophoresis 4mg /ml Dexamethasone , Patient/Family education, Balance training, Stair training, Taping, Joint mobilization, Spinal mobilization, Vestibular training, DME instructions, Cryotherapy, and Moist heat.  PLAN FOR NEXT SESSION: DC next visit,  Nustep, Dynamic balance, resistance training, balance training  Nathalie Cavendish B. Lariya Kinzie, PT 08/14/24 10:27 AM [facility] [address] [place], [place] [address] Phone # [phone] Fax [phone]  "

## 2024-08-18 ENCOUNTER — Ambulatory Visit

## 2024-08-31 ENCOUNTER — Ambulatory Visit (HOSPITAL_BASED_OUTPATIENT_CLINIC_OR_DEPARTMENT_OTHER): Payer: Medicare PPO | Admitting: Obstetrics & Gynecology

## 2024-09-17 ENCOUNTER — Inpatient Hospital Stay: Admitting: Hematology & Oncology

## 2024-09-17 ENCOUNTER — Inpatient Hospital Stay: Attending: Hematology & Oncology

## 2024-09-18 ENCOUNTER — Ambulatory Visit: Admitting: Neurology

## 2024-09-23 ENCOUNTER — Ambulatory Visit: Admitting: Neurology

## 2024-10-15 ENCOUNTER — Ambulatory Visit (HOSPITAL_BASED_OUTPATIENT_CLINIC_OR_DEPARTMENT_OTHER)

## 2025-02-04 ENCOUNTER — Ambulatory Visit: Admitting: Physical Therapy
